# Patient Record
Sex: Female | Born: 1945
Health system: Southern US, Community
[De-identification: ages and names within clinical notes are randomized; demographics above are authoritative.]

## PROBLEM LIST (undated history)

## (undated) DIAGNOSIS — M1990 Unspecified osteoarthritis, unspecified site: Secondary | ICD-10-CM

## (undated) DIAGNOSIS — E785 Hyperlipidemia, unspecified: Secondary | ICD-10-CM

## (undated) DIAGNOSIS — I10 Essential (primary) hypertension: Secondary | ICD-10-CM

## (undated) DIAGNOSIS — K219 Gastro-esophageal reflux disease without esophagitis: Secondary | ICD-10-CM

## (undated) DIAGNOSIS — R7303 Prediabetes: Secondary | ICD-10-CM

## (undated) DIAGNOSIS — G2581 Restless legs syndrome: Secondary | ICD-10-CM

## (undated) HISTORY — DX: Unspecified osteoarthritis, unspecified site: M19.90

## (undated) HISTORY — PX: CHOLECYSTECTOMY: SHX55

## (undated) HISTORY — DX: Hyperlipidemia, unspecified: E78.5

## (undated) HISTORY — PX: ANKLE SURGERY: SHX546

## (undated) HISTORY — DX: Restless legs syndrome: G25.81

## (undated) HISTORY — PX: TUBAL LIGATION: SHX77

## (undated) HISTORY — DX: Essential (primary) hypertension: I10

## (undated) HISTORY — DX: Prediabetes: R73.03

## (undated) HISTORY — DX: Gastro-esophageal reflux disease without esophagitis: K21.9

---

## 1999-07-22 ENCOUNTER — Ambulatory Visit (HOSPITAL_BASED_OUTPATIENT_CLINIC_OR_DEPARTMENT_OTHER): Admission: RE | Admit: 1999-07-22 | Discharge: 1999-07-22 | Payer: Self-pay | Admitting: Orthopedic Surgery

## 2000-07-06 ENCOUNTER — Other Ambulatory Visit: Admission: RE | Admit: 2000-07-06 | Discharge: 2000-07-06 | Payer: Self-pay | Admitting: *Deleted

## 2001-08-06 ENCOUNTER — Other Ambulatory Visit: Admission: RE | Admit: 2001-08-06 | Discharge: 2001-08-06 | Payer: Self-pay | Admitting: *Deleted

## 2001-11-06 ENCOUNTER — Other Ambulatory Visit: Admission: RE | Admit: 2001-11-06 | Discharge: 2001-11-06 | Payer: Self-pay | Admitting: *Deleted

## 2002-08-21 ENCOUNTER — Ambulatory Visit (HOSPITAL_COMMUNITY): Admission: RE | Admit: 2002-08-21 | Discharge: 2002-08-21 | Payer: Self-pay | Admitting: Internal Medicine

## 2002-08-21 ENCOUNTER — Encounter: Payer: Self-pay | Admitting: Internal Medicine

## 2002-08-22 ENCOUNTER — Ambulatory Visit (HOSPITAL_COMMUNITY): Admission: RE | Admit: 2002-08-22 | Discharge: 2002-08-22 | Payer: Self-pay | Admitting: Internal Medicine

## 2002-08-22 ENCOUNTER — Encounter: Payer: Self-pay | Admitting: Internal Medicine

## 2002-10-07 ENCOUNTER — Other Ambulatory Visit: Admission: RE | Admit: 2002-10-07 | Discharge: 2002-10-07 | Payer: Self-pay | Admitting: Internal Medicine

## 2003-11-18 ENCOUNTER — Encounter (INDEPENDENT_AMBULATORY_CARE_PROVIDER_SITE_OTHER): Payer: Self-pay | Admitting: *Deleted

## 2004-03-21 ENCOUNTER — Ambulatory Visit (HOSPITAL_COMMUNITY): Admission: RE | Admit: 2004-03-21 | Discharge: 2004-03-21 | Payer: Self-pay | Admitting: Internal Medicine

## 2004-04-08 ENCOUNTER — Emergency Department (HOSPITAL_COMMUNITY): Admission: EM | Admit: 2004-04-08 | Discharge: 2004-04-09 | Payer: Self-pay | Admitting: Emergency Medicine

## 2004-08-24 ENCOUNTER — Other Ambulatory Visit: Admission: RE | Admit: 2004-08-24 | Discharge: 2004-08-24 | Payer: Self-pay | Admitting: Family Medicine

## 2005-08-22 ENCOUNTER — Ambulatory Visit (HOSPITAL_COMMUNITY): Admission: RE | Admit: 2005-08-22 | Discharge: 2005-08-22 | Payer: Self-pay | Admitting: Internal Medicine

## 2006-06-07 ENCOUNTER — Other Ambulatory Visit: Admission: RE | Admit: 2006-06-07 | Discharge: 2006-06-07 | Payer: Self-pay | Admitting: Internal Medicine

## 2008-04-27 ENCOUNTER — Inpatient Hospital Stay (HOSPITAL_COMMUNITY): Admission: EM | Admit: 2008-04-27 | Discharge: 2008-04-29 | Payer: Self-pay | Admitting: Emergency Medicine

## 2009-08-03 ENCOUNTER — Encounter: Admission: RE | Admit: 2009-08-03 | Discharge: 2009-08-03 | Payer: Self-pay | Admitting: Orthopaedic Surgery

## 2009-08-04 ENCOUNTER — Encounter: Admission: RE | Admit: 2009-08-04 | Discharge: 2009-08-04 | Payer: Self-pay | Admitting: Orthopaedic Surgery

## 2009-11-23 ENCOUNTER — Ambulatory Visit: Payer: Self-pay | Admitting: Gynecology

## 2009-11-23 ENCOUNTER — Other Ambulatory Visit: Admission: RE | Admit: 2009-11-23 | Discharge: 2009-11-23 | Payer: Self-pay | Admitting: Gynecology

## 2010-07-21 ENCOUNTER — Ambulatory Visit (HOSPITAL_COMMUNITY): Admission: RE | Admit: 2010-07-21 | Discharge: 2010-07-21 | Payer: Self-pay | Admitting: Internal Medicine

## 2010-08-10 LAB — HM COLONOSCOPY

## 2011-01-10 NOTE — Procedures (Signed)
Summary: Colonoscopy/[facility]  Colonoscopy/[facility]   Imported By: Sherian Rein 08/25/2010 07:51:30  _____________________________________________________________________  External Attachment:    Type:   Image     Comment:   External Document

## 2011-04-25 NOTE — Op Note (Signed)
NAME:  Emily Jenkins, Emily Jenkins           ACCOUNT NO.:  [id number]   MEDICAL RECORD NO.:  [id number]          PATIENT TYPE:  INP   LOCATION:  5023                         FACILITY:  [facility]   PHYSICIAN:  Mark C. Ophelia Charter, M.D.    DATE OF BIRTH:  1946-05-31   DATE OF PROCEDURE:  04/27/2008  DATE OF DISCHARGE:                               OPERATIVE REPORT   PREOPERATIVE DIAGNOSIS:  Left bimalleolar ankle fracture.   POSTOPERATIVE DIAGNOSIS:  Left bimalleolar ankle fracture.   PROCEDURE:  Open reduction and internal fixation of left bimalleolar  ankle fracture.   SURGEON:  Mark C. Ophelia Charter, MD.   ANESTHESIA:  General plus Marcaine local 14 mL.   DRAINS:  None.   TOURNIQUET TIME:  42 minutes x350.   BRIEF HISTORY:  A 65 year old female [place], and fell suffering a left bimalleolar ankle fracture  with comminution of the medial malleolus as well as the fibula with a  short oblique fracture.  She had significant subluxation of her ankle  and was placed in a splint in the emergency room and brought out for an  operative stabilization.   After standard prepping and draping, preoperative Ancef, time-out  procedure, using the surgical checklist, incision was made medially  subperiosteal dissection, the cortex was very thin and was actually  bendable.  After reduction and irrigation, posterior tibial tendon was  present.  There was some venous bleeding and significant subcu hematoma  formation from the fracture, which was irrigated and branches of the  saphenous were coagulated.  Incision was then made laterally over the  fibula subperiosteal section reduction.  Anterior cortex had some  comminution but the large distal piece was able be lined up using the  posterior cortex and the lateral cortex.  Anteriorly, there were several  small little pieces at the level of the fracture and slightly distal.  With 2 major fragments, anatomic 701 tubular plate was  placed distal.  Unicortical screws. proximal bicortical screws x4, and an inner frag  screw from the anterior to posterior lagging the fracture.  A 24-mm  cortical screw was used for this.  Medial side was then readdressed.  Fracture reduced and two 50-mm cancellous lag screws were placed across,  tightened down and checked under fluoroscopy.  There was stability of  the fracture, although, there was  comminution and that she will be nonweightbearing in a cast.  Wound was  irrigated medially and then both wounds were closed with 2-0 Vicryl  followed by skin staple closure and Marcaine infiltration.  Adaptic, 4 x  4s, Webril, and short-leg splint.      Mark C. Ophelia Charter, M.D.  Electronically Signed     MCY/MEDQ  D:  04/27/2008  T:  04/28/2008  Job:  [id number]

## 2011-09-06 LAB — POCT I-STAT, CHEM 8
Calcium, Ion: 1.05 — ABNORMAL LOW
Chloride: 105
Creatinine, Ser: 1
Hemoglobin: 12.2
Potassium: 3.7
Sodium: 140

## 2011-09-06 LAB — PROTIME-INR: INR: 1

## 2011-09-06 LAB — CBC
Hemoglobin: 12.3
MCHC: 34.6
RBC: 4.25
RDW: 13.1

## 2011-09-06 LAB — APTT: aPTT: 30

## 2012-08-05 ENCOUNTER — Other Ambulatory Visit (HOSPITAL_COMMUNITY): Payer: Self-pay | Admitting: Internal Medicine

## 2012-08-05 ENCOUNTER — Ambulatory Visit (HOSPITAL_COMMUNITY)
Admission: RE | Admit: 2012-08-05 | Discharge: 2012-08-05 | Disposition: A | Discharge status: Home / Self Care | Payer: Medicare Other | Source: Ambulatory Visit | Attending: Internal Medicine | Admitting: Internal Medicine

## 2012-08-05 DIAGNOSIS — R109 Unspecified abdominal pain: Secondary | ICD-10-CM | POA: Insufficient documentation

## 2012-08-05 DIAGNOSIS — K5900 Constipation, unspecified: Secondary | ICD-10-CM | POA: Insufficient documentation

## 2012-08-05 DIAGNOSIS — R0602 Shortness of breath: Secondary | ICD-10-CM | POA: Insufficient documentation

## 2012-08-05 DIAGNOSIS — R509 Fever, unspecified: Secondary | ICD-10-CM | POA: Insufficient documentation

## 2012-08-19 ENCOUNTER — Other Ambulatory Visit: Payer: Self-pay | Admitting: Internal Medicine

## 2012-08-19 DIAGNOSIS — J984 Other disorders of lung: Secondary | ICD-10-CM

## 2012-08-26 ENCOUNTER — Ambulatory Visit
Admission: RE | Admit: 2012-08-26 | Discharge: 2012-08-26 | Disposition: A | Discharge status: Home / Self Care | Payer: Medicare Other | Source: Ambulatory Visit | Attending: Internal Medicine | Admitting: Internal Medicine

## 2012-08-26 DIAGNOSIS — J984 Other disorders of lung: Secondary | ICD-10-CM

## 2012-08-28 ENCOUNTER — Other Ambulatory Visit: Payer: Self-pay

## 2012-08-28 DIAGNOSIS — M79609 Pain in unspecified limb: Secondary | ICD-10-CM

## 2012-08-28 DIAGNOSIS — I83893 Varicose veins of bilateral lower extremities with other complications: Secondary | ICD-10-CM

## 2012-09-13 ENCOUNTER — Encounter: Payer: Self-pay | Admitting: Vascular Surgery

## 2012-09-16 ENCOUNTER — Ambulatory Visit (INDEPENDENT_AMBULATORY_CARE_PROVIDER_SITE_OTHER): Payer: Medicare Other | Admitting: Vascular Surgery

## 2012-09-16 ENCOUNTER — Encounter (INDEPENDENT_AMBULATORY_CARE_PROVIDER_SITE_OTHER): Payer: Medicare Other | Admitting: *Deleted

## 2012-09-16 ENCOUNTER — Encounter: Payer: Self-pay | Admitting: Vascular Surgery

## 2012-09-16 VITALS — BP 143/80 | HR 73 | Resp 16 | Ht 65.5 in | Wt 165.0 lb

## 2012-09-16 DIAGNOSIS — I83893 Varicose veins of bilateral lower extremities with other complications: Secondary | ICD-10-CM

## 2012-09-16 DIAGNOSIS — I8393 Asymptomatic varicose veins of bilateral lower extremities: Secondary | ICD-10-CM | POA: Insufficient documentation

## 2012-09-16 DIAGNOSIS — M79609 Pain in unspecified limb: Secondary | ICD-10-CM

## 2012-09-16 NOTE — Progress Notes (Signed)
Subjective:     Patient ID: Emily Jenkins, female   DOB: [birth year], 66 y.o.   MRN: [id number]  HPI this 66 year old female is referred by Dr. Marlowe Shores for evaluation of bilateral venous insufficiency. This patient states she has had enlarging varicose veins for 15-20 years. She has increasing aching throbbing and burning discomfort in both calves and ankles which worsens as the day progresses. This is partially relieved by elevation. She is not worn elastic compression stockings on a regular basis. She has no history of DVT, thrombophlebitis, stasis ulcers, bleeding, or other complications. Varicosities have continued to enlarge and her symptoms have continued to worsen. She develops swelling in the ankles as the day progresses. Right leg is slightly more symptomatic than the left.  History reviewed. No pertinent past medical history.  History  Substance Use Topics  . Smoking status: Never Smoker   . Smokeless tobacco: Not on file  . Alcohol Use: No    Family History  Problem Relation Age of Onset  . Heart disease Mother   . Hypertension Mother   . Cancer Father   . Diabetes Father   . Hyperlipidemia Sister   . Hypertension Sister     No Known Allergies  Current outpatient prescriptions:cetirizine (ZYRTEC) 10 MG tablet, Take 10 mg by mouth as needed., Disp: , Rfl: ;  simvastatin (ZOCOR) 20 MG tablet, Take 20 mg by mouth every evening., Disp: , Rfl: ;  valsartan-hydrochlorothiazide (DIOVAN-HCT) 320-25 MG per tablet, Take 1 tablet by mouth daily., Disp: , Rfl:   BP 143/80  Pulse 73  Resp 16  Ht 5' 5.5" (1.664 m)  Wt 165 lb (74.844 kg)  BMI 27.04 kg/m2  Body mass index is 27.04 kg/(m^2).           Review of Systems denies chest pain but does have mild dyspnea on exertion. Has history of left ankle fracture in his pain in this area. Also complains of occasional numbness and tingling in her fingers bilaterally. Occasional chest discomfort. All other systems negative  complete review of systems    Objective:   Physical Exam blood pressure 143/80 heart rate 73 respirations 16 Gen.-alert and oriented x3 in no apparent distress HEENT normal for age Lungs no rhonchi or wheezing Cardiovascular regular rhythm no murmurs carotid pulses 3+ palpable no bruits audible Abdomen soft nontender no palpable masses Musculoskeletal free of  major deformities Skin clear -no rashes Neurologic normal Lower extremities 3+ femoral and dorsalis pedis pulses palpable bilaterally with 1+ edema bilaterally. Left leg has bulging varicosities in the medial calf with 1+ edema and early hyperpigmentation. These are over the great saphenous system. Right leg has large or bulging varicosities in the medial calf and posterior calf up to the popliteal fossa. She has 1+ edema on the right with early hyperpigmentation.  Today I ordered bilateral venous duplex exam which I reviewed and interpreted. The right leg has no DVT and no deep venous reflux. There is gross reflux in the right great saphenous and small saphenous systems supplying these varicosities. Left leg has no DVT and has gross reflux in the great saphenous system from the knee to the saphenofemoral junction with no reflux in the small saphenous system.       Assessment:     Bilateral severe venous insufficiency with gross reflux bilateral great saphenous veins and right small saphenous vein supplying bulging symptomatic varicosities-affecting patient's daily living on increasing basis    Plan:     #1 long-leg elastic compression  stockings 20-30 mm gradient #2 elevate legs daily as much as possible #3 ibuprofen on a daily basis #4 return in 3 months. She has had no significant improvement she should #1 laser ablation right great saphenous vein followed by #2 laser ablation right small saphenous vein followed by #3 laser ablation left great saphenous vein. Patient should then return in 3 months to see if stab phlebectomy of  secondary varicosities bilaterally  is needed

## 2012-10-11 ENCOUNTER — Encounter: Payer: Medicare Other | Admitting: Vascular Surgery

## 2012-12-16 ENCOUNTER — Encounter: Payer: Self-pay | Admitting: Vascular Surgery

## 2012-12-17 ENCOUNTER — Ambulatory Visit: Payer: Medicare Other | Admitting: Vascular Surgery

## 2012-12-27 ENCOUNTER — Encounter: Payer: Self-pay | Admitting: Vascular Surgery

## 2012-12-30 ENCOUNTER — Encounter: Payer: Self-pay | Admitting: Vascular Surgery

## 2012-12-30 ENCOUNTER — Ambulatory Visit (INDEPENDENT_AMBULATORY_CARE_PROVIDER_SITE_OTHER): Payer: Medicare PPO | Admitting: Vascular Surgery

## 2012-12-30 VITALS — BP 146/73 | HR 92 | Resp 16 | Ht 65.0 in | Wt 165.0 lb

## 2012-12-30 DIAGNOSIS — I83893 Varicose veins of bilateral lower extremities with other complications: Secondary | ICD-10-CM

## 2012-12-30 NOTE — Progress Notes (Signed)
Subjective:     Patient ID: Emily Jenkins, female   DOB: 06-05-1946, 67 y.o.   MRN: [id number]  HPI this 67 year old female returns for continued evaluation of her severe varicose veins in both lower extremities . She has documented reflux in both great saphenous systems and the right small saphenous system which has resulted in bulging varicosities throughout both legs. These cause aching throbbing and burning discomfort. She has tried long-leg elastic compression stockings 20-30 mm gradient as well as elevation and ibuprofen over the past 3 months with no success. She continues to have significant discomfort throughout the day particularly while she is standing or sitting. She has no history of DVT or thrombophlebitis.  No past medical history on file.  History  Substance Use Topics  . Smoking status: Never Smoker   . Smokeless tobacco: Not on file  . Alcohol Use: No    Family History  Problem Relation Age of Onset  . Heart disease Mother   . Hypertension Mother   . Cancer Father   . Diabetes Father   . Hyperlipidemia Sister   . Hypertension Sister     No Known Allergies  Current outpatient prescriptions:cetirizine (ZYRTEC) 10 MG tablet, Take 10 mg by mouth as needed., Disp: , Rfl: ;  omeprazole (PRILOSEC) 40 MG capsule, Take 40 mg by mouth daily., Disp: , Rfl: ;  simvastatin (ZOCOR) 20 MG tablet, Take 20 mg by mouth every evening., Disp: , Rfl: ;  valsartan-hydrochlorothiazide (DIOVAN-HCT) 320-25 MG per tablet, Take 1 tablet by mouth daily., Disp: , Rfl:   BP 146/73  Pulse 92  Resp 16  Ht 5\' 5"  (1.651 m)  Wt 165 lb (74.844 kg)  BMI 27.46 kg/m2  Body mass index is 27.46 kg/(m^2).        Review of Systems denies chest pain, dyspnea on exertion, PND, orthopnea,, suspect    Objective:   Physical Exam blood pressure 146/73 heart rate 92 respirations 16 General well-developed well-nourished female in no apparent distress alert and oriented x3 Lungs no rhonchi or  wheezing Lower extremities-right leg has bulging varicosities in the medial thigh and medial calf posterior calf with 1+ chronic edema. No active ulceration noted. 3+ dorsalis pedis pulse palpable. Left leg has bulging varicosities in the medial calf and extending down onto the dorsum of the foot with 1+ edema in mild hyperpigmentation but no active ulcer.    Assessment:     Severe bilateral venous insufficiency-symptomatic secondary to gross reflux in bilateral great saphenous veins and right small saphenous vein-resistant to conservative measures-affecting patient's daily living    Plan:     Patient needs #1 laser ablation right great saphenous vein followed by #2 laser ablation right small saphenous vein followed by #3 laser ablation left great saphenous vein. Patient should then return in 3 months to see if stab phlebectomy of residual varicosities would be indicated We'll proceed with precertification to perform this in the near future to relieve symptoms of this nice lady

## 2012-12-31 ENCOUNTER — Ambulatory Visit: Payer: Medicare Other | Admitting: Vascular Surgery

## 2013-01-03 ENCOUNTER — Other Ambulatory Visit: Payer: Self-pay | Admitting: *Deleted

## 2013-01-03 DIAGNOSIS — I83893 Varicose veins of bilateral lower extremities with other complications: Secondary | ICD-10-CM

## 2013-01-06 ENCOUNTER — Ambulatory Visit (INDEPENDENT_AMBULATORY_CARE_PROVIDER_SITE_OTHER): Payer: Medicare PPO | Admitting: Vascular Surgery

## 2013-01-06 ENCOUNTER — Encounter: Payer: Self-pay | Admitting: Vascular Surgery

## 2013-01-06 VITALS — BP 146/80 | HR 80 | Resp 16 | Ht 65.5 in | Wt 165.0 lb

## 2013-01-06 DIAGNOSIS — I83893 Varicose veins of bilateral lower extremities with other complications: Secondary | ICD-10-CM

## 2013-01-06 NOTE — Progress Notes (Signed)
Subjective:     Patient ID: Emily Jenkins, female   DOB: 06-Aug-1946, 67 y.o.   MRN: [id number]  HPI this 67 year old female had laser ablation of the right great saphenous vein from the knee to the saphenofemoral junction performed under local tumescent anesthesia for painful varicosities. She tolerated surgery well. Total 2100 J of energy was utilized  Review of Systems     Objective:   Physical ExamBP 146/80  Pulse 80  Resp 16  Ht 5' 5.5" (1.664 m)  Wt 165 lb (74.844 kg)  BMI 27.04 kg/m2      Assessment:     Well-tolerated laser ablation right great saphenous vein performed under local tumescent anesthesia painful varicosities    Plan:     Return February 4 for venous duplex exam right leg to confirm closure right great saphenous vein Patient will then need laser ablation right small saphenous and left great saphenous vein in  near future

## 2013-01-06 NOTE — Progress Notes (Signed)
Laser Ablation Procedure      Date: 01/06/2013    Emily Jenkins DOB:Nov 13, 1946  Consent signed: Yes  Surgeon:J.D. Hart [place]  Procedure: Laser Ablation: right Greater Saphenous Vein  BP 146/80  Pulse 80  Resp 16  Ht 5' 5.5" (1.664 m)  Wt 165 lb (74.844 kg)  BMI 27.04 kg/m2  Start time: 11:20   End time: 11:50  Tumescent Anesthesia: 300 cc 0.9% NaCl with 50 cc Lidocaine HCL with 1% Epi and 15 cc 8.4% NaHCO3  Local Anesthesia: 5 cc Lidocaine HCL and NaHCO3 (ratio 2:1)  Pulsed mode: Watts 15 Seconds 1 Pulses:1 Total Pulses:140 Total Energy: 2100 Total Time: 2:20     Patient tolerated procedure well: Yes  Notes:   Description of Procedure:  After marking the course of the saphenous vein and the secondary varicosities in the standing position, the patient was placed on the operating table in the supine position, and the right leg was prepped and draped in sterile fashion. Local anesthetic was administered, and under ultrasound guidance the saphenous vein was accessed with a micro needle and guide wire; then the micro puncture sheath was placed. A guide wire was inserted to the saphenofemoral junction, followed by a 5 french sheath.  The position of the sheath and then the laser fiber below the junction was confirmed using the ultrasound and visualization of the aiming beam.  Tumescent anesthesia was administered along the course of the saphenous vein using ultrasound guidance. Protective laser glasses were placed on the patient, and the laser was fired at 15 watt pulsed mode advancing 1-2 mm per sec.  For a total of 2100 joules.  A steri strip was applied to the puncture site.   ABD pads and thigh high compression stockings were applied.  Ace wrap bandages were applied over the phlebectomy sites and at the top of the saphenofemoral junction.  Blood loss was less than 15 cc.  The patient ambulated out of the operating room having tolerated the procedure well.

## 2013-01-07 ENCOUNTER — Telehealth: Payer: Self-pay | Admitting: *Deleted

## 2013-01-07 NOTE — Telephone Encounter (Signed)
Patient doing well. Not having any problems except maybe the Ibuprofen is bothering her stomach. Discussed taking the med on a full stomach, etc. Reminded her of her fu appt. And also reviewed the instructions.

## 2013-01-13 ENCOUNTER — Encounter: Payer: Self-pay | Admitting: Vascular Surgery

## 2013-01-14 ENCOUNTER — Encounter: Payer: Self-pay | Admitting: Vascular Surgery

## 2013-01-14 ENCOUNTER — Encounter (INDEPENDENT_AMBULATORY_CARE_PROVIDER_SITE_OTHER): Payer: Medicare PPO | Admitting: *Deleted

## 2013-01-14 ENCOUNTER — Ambulatory Visit (INDEPENDENT_AMBULATORY_CARE_PROVIDER_SITE_OTHER): Payer: Medicare PPO | Admitting: Vascular Surgery

## 2013-01-14 ENCOUNTER — Other Ambulatory Visit: Payer: Self-pay | Admitting: *Deleted

## 2013-01-14 VITALS — BP 141/76 | HR 86 | Resp 16 | Ht 65.5 in | Wt 165.0 lb

## 2013-01-14 DIAGNOSIS — I83893 Varicose veins of bilateral lower extremities with other complications: Secondary | ICD-10-CM

## 2013-01-14 NOTE — Progress Notes (Signed)
Subjective:     Patient ID: Emily Jenkins, female   DOB: 1946-11-23, 67 y.o.   MRN: [id number]  HPI this 67 year old female returns 1 week post laser ablation right great saphenous vein performed for venous hypertension with bulging varicosities and chronic edema. She has had mild to moderate discomfort along the course of the great saphenous vein which is beginning to improve significantly. She is noted that the ulcers in the distal thigh and medial calf are less tight than they were previously. She does have some chronic edema in the right ankle. She continues to have symptoms in the contralateral leg as well. She has worn her elastic compression stocking and taking ibuprofen as instructed.  No past medical history on file.  History  Substance Use Topics  . Smoking status: Never Smoker   . Smokeless tobacco: Not on file  . Alcohol Use: No    Family History  Problem Relation Age of Onset  . Heart disease Mother   . Hypertension Mother   . Cancer Father   . Diabetes Father   . Hyperlipidemia Sister   . Hypertension Sister     No Known Allergies  Current outpatient prescriptions:cetirizine (ZYRTEC) 10 MG tablet, Take 10 mg by mouth as needed., Disp: , Rfl: ;  omeprazole (PRILOSEC) 40 MG capsule, Take 40 mg by mouth daily., Disp: , Rfl: ;  simvastatin (ZOCOR) 20 MG tablet, Take 20 mg by mouth every evening., Disp: , Rfl: ;  valsartan-hydrochlorothiazide (DIOVAN-HCT) 320-25 MG per tablet, Take 1 tablet by mouth daily., Disp: , Rfl:   BP 141/76  Pulse 86  Resp 16  Ht 5' 5.5" (1.664 m)  Wt 165 lb (74.844 kg)  BMI 27.04 kg/m2  Body mass index is 27.04 kg/(m^2).           Review of Systems denies chest pain, dyspnea on exertion, PND, orthopnea, hemoptysis, claudication    Objective:   Physical Exam blood pressure 141/76 heart rate 86 respirations 16 General well-developed well-nourished female in no apparent stress alert and oriented x3 Lungs no rhonchi or  wheezing Right lower extremity with mild tenderness along the course of great saphenous vein with no ecchymosis noted. 1+ chronic edema with early hyperpigmentation. Bulging varicosities in the medial calf extending down to the mid calf level.  Today I ordered a venous duplex exam of the right leg which are reviewed and interpreted. There is no DVT. Next great saphenous vein is totally occluded from the distal thigh of to a distance 1.5 cm from the saphenofemoral junction    Assessment:     Successful well-tolerated laser ablation right great saphenous vein performed under local tumescent anesthesia    Plan:     Plan laser ablation right small saphenous vein 02/10/2013 and will then proceed with laser ablation left great saphenous vein as  the third procedure.

## 2013-02-07 ENCOUNTER — Encounter: Payer: Self-pay | Admitting: Vascular Surgery

## 2013-02-10 ENCOUNTER — Ambulatory Visit (INDEPENDENT_AMBULATORY_CARE_PROVIDER_SITE_OTHER): Payer: Medicare PPO | Admitting: Vascular Surgery

## 2013-02-10 ENCOUNTER — Encounter: Payer: Self-pay | Admitting: Vascular Surgery

## 2013-02-10 VITALS — BP 162/89 | HR 83 | Resp 16 | Ht 65.5 in | Wt 165.0 lb

## 2013-02-10 DIAGNOSIS — I83893 Varicose veins of bilateral lower extremities with other complications: Secondary | ICD-10-CM

## 2013-02-10 NOTE — Progress Notes (Signed)
Subjective:     Patient ID: Emily Jenkins, female   DOB: 1946/09/24, 67 y.o.   MRN: [id number]  HPI this 67 year old female had laser ablation right small saphenous vein performed under local tumescent anesthesia for venous hypertension in the right leg. She tolerated the procedure well. Total of 925 J of energy was utilized. She did not have a direct connection from the saphenous vein to the popliteal vein.   Review of Systems     Objective:   Physical Exam BP 162/89  Pulse 83  Resp 16  Ht 5' 5.5" (1.664 m)  Wt 165 lb (74.844 kg)  BMI 27.03 kg/m2        Assessment:     Well-tolerated laser ablation right small saphenous vein performed under local tumescent anesthesia    Plan:     Return March 11 venous duplex exam right leg to confirm closure right small saphenous vein Will then be scheduled for laser ablation left great saphenous vein

## 2013-02-10 NOTE — Progress Notes (Signed)
Laser Ablation Procedure      Date: 02/10/2013    Emily Jenkins DOB:01/24/1946  Consent signed: Yes  Surgeon:J.D. Hart [place]  Procedure: Laser Ablation: right Small Saphenous Vein  BP 162/89  Pulse 83  Resp 16  Ht 5' 5.5" (1.664 m)  Wt 165 lb (74.844 kg)  BMI 27.03 kg/m2  Start time: 1:15   End time: 1:490  Tumescent Anesthesia: 100 cc 0.9% NaCl with 50 cc Lidocaine HCL with 1% Epi and 15 cc 8.4% NaHCO3  Local Anesthesia: 3 cc Lidocaine HCL and NaHCO3 (ratio 2:1)  Pulsed mode:yes Watts 15 Seconds 1 Pulses:1 Total Pulses:63 Total Energy: 842 Total Time: 1:02     Patient tolerated procedure well: Yes  Notes:   Description of Procedure:  After marking the course of the saphenous vein and the secondary varicosities in the standing position, the patient was placed on the operating table in the prone position, and the right leg was prepped and draped in sterile fashion. Local anesthetic was administered, and under ultrasound guidance the saphenous vein was accessed with a micro needle and guide wire; then the micro puncture sheath was placed. A guide wire was inserted to the saphenopopliteal junction, followed by a 5 french sheath.  The position of the sheath and then the laser fiber below the junction was confirmed using the ultrasound and visualization of the aiming beam.  Tumescent anesthesia was administered along the course of the saphenous vein using ultrasound guidance. Protective laser glasses were placed on the patient, and the laser was fired at 15 watt pulsed mode advancing 1-2 mm per sec.  For a total of 842 joules.  A steri strip was applied to the puncture site.    ABD pads and thigh high compression stockings were applied.  Ace wrap bandages were applied over the phlebectomy sites and at the top of the saphenopopliteal junction.  Blood loss was less than 15 cc.  The patient ambulated out of the operating room having tolerated the procedure well.

## 2013-02-11 ENCOUNTER — Telehealth: Payer: Self-pay | Admitting: *Deleted

## 2013-02-11 ENCOUNTER — Encounter: Payer: Self-pay | Admitting: Vascular Surgery

## 2013-02-11 NOTE — Telephone Encounter (Signed)
Patient doing well except unable to sleep. She usually takes something to help her and last night she didn't. Told her to check with her pharmacist to see if she could mix her med with Ibuprofen. Otherwise doing well. Reminded her of her fu appt.

## 2013-02-17 ENCOUNTER — Encounter: Payer: Self-pay | Admitting: Vascular Surgery

## 2013-02-18 ENCOUNTER — Ambulatory Visit (INDEPENDENT_AMBULATORY_CARE_PROVIDER_SITE_OTHER): Payer: Medicare PPO | Admitting: Vascular Surgery

## 2013-02-18 ENCOUNTER — Other Ambulatory Visit: Payer: Self-pay | Admitting: *Deleted

## 2013-02-18 ENCOUNTER — Encounter: Payer: Self-pay | Admitting: Vascular Surgery

## 2013-02-18 ENCOUNTER — Encounter (INDEPENDENT_AMBULATORY_CARE_PROVIDER_SITE_OTHER): Payer: Medicare PPO | Admitting: *Deleted

## 2013-02-18 ENCOUNTER — Telehealth: Payer: Self-pay | Admitting: *Deleted

## 2013-02-18 VITALS — BP 144/78 | HR 80 | Resp 16 | Ht 65.5 in | Wt 165.0 lb

## 2013-02-18 DIAGNOSIS — I83893 Varicose veins of bilateral lower extremities with other complications: Secondary | ICD-10-CM

## 2013-02-18 NOTE — Progress Notes (Signed)
Subjective:     Patient ID: Emily Jenkins, female   DOB: 1946-04-18, 67 y.o.   MRN: [id number]  HPIthis 67 year old female returns for followup regarding her laser ablation of the right small saphenous vein which was performed last week for venous hypertension. She had previously had the right great saphenous vein treated with laser ablation. She has had some moderate discomfort in the right calf following the procedure but no distal edema in the symptoms are improving. She has been wearing her elastic compression stocking. He tends to have symptoms in the left leg and is wearing a compression stocking as well.  History reviewed. No pertinent past medical history.  History  Substance Use Topics  . Smoking status: Never Smoker   . Smokeless tobacco: Not on file  . Alcohol Use: No    Family History  Problem Relation Age of Onset  . Heart disease Mother   . Hypertension Mother   . Cancer Father   . Diabetes Father   . Hyperlipidemia Sister   . Hypertension Sister     No Known Allergies  Current outpatient prescriptions:butalbital-acetaminophen-caffeine (FIORICET WITH CODEINE) 50-325-40-30 MG per capsule, Take 1 capsule by mouth every 4 (four) hours as needed for headache., Disp: , Rfl: ;  cetirizine (ZYRTEC) 10 MG tablet, Take 10 mg by mouth as needed., Disp: , Rfl: ;  omeprazole (PRILOSEC) 40 MG capsule, Take 40 mg by mouth daily., Disp: , Rfl:  oxazepam (SERAX) 30 MG capsule, Take 30 mg by mouth at bedtime as needed for sleep or anxiety., Disp: , Rfl: ;  simvastatin (ZOCOR) 20 MG tablet, Take 20 mg by mouth every evening., Disp: , Rfl: ;  valsartan-hydrochlorothiazide (DIOVAN-HCT) 320-25 MG per tablet, Take 1 tablet by mouth daily., Disp: , Rfl:   BP 144/78  Pulse 80  Resp 16  Ht 5' 5.5" (1.664 m)  Wt 165 lb (74.844 kg)  BMI 27.03 kg/m2  Body mass index is 27.03 kg/(m^2).             Review of Systemsdenies chest pain, dyspnea on exertion, PND, orthopnea,  hemoptysis, claudication     Objective:   Physical Examblood pressure 144/78 heart rate 80 respirations 16 General well-developed well-nourished female in no apparent stress alert and oriented x3 Lungs no rhonchi or wheezing Right leg with mild tenderness along course of small saphenous vein posteriorly. No distal edema noted. Bulging varicosities in the medial proximal calf are not as tense as before but still present.  Today I ordered a venous duplex exam of the right leg which are reviewed and interpreted. There is no DVT. There is total closure of the right great and right small saphenous veins.     Assessment:     Successful laser ablation right great and small saphenous veins are venous hypertension     Plan:     Return March 24 for similar procedure on contralateral left great saphenous vein

## 2013-02-18 NOTE — Telephone Encounter (Signed)
Told pt about her fu visits

## 2013-02-28 ENCOUNTER — Encounter: Payer: Self-pay | Admitting: Vascular Surgery

## 2013-03-03 ENCOUNTER — Ambulatory Visit (INDEPENDENT_AMBULATORY_CARE_PROVIDER_SITE_OTHER): Payer: Medicare PPO | Admitting: Vascular Surgery

## 2013-03-03 ENCOUNTER — Encounter: Payer: Self-pay | Admitting: Vascular Surgery

## 2013-03-03 VITALS — BP 156/73 | HR 82 | Resp 16 | Ht 65.5 in | Wt 165.0 lb

## 2013-03-03 DIAGNOSIS — I83893 Varicose veins of bilateral lower extremities with other complications: Secondary | ICD-10-CM

## 2013-03-03 NOTE — Progress Notes (Signed)
Laser Ablation Procedure      Date: 03/03/2013    Emily Jenkins DOB:[birth year]  Consent signed: Yes  Surgeon:J.D. Hart [place]  Procedure: Laser Ablation: left Greater Saphenous Vein  BP 156/73  Pulse 82  Resp 16  Ht 5' 5.5" (1.664 m)  Wt 165 lb (74.844 kg)  BMI 27.03 kg/m2  Start time: 1:00   End time: 1:35  Tumescent Anesthesia: 300 cc 0.9% NaCl with 50 cc Lidocaine HCL with 1% Epi and 15 cc 8.4% NaHCO3  Local Anesthesia: 5 cc Lidocaine HCL and NaHCO3 (ratio 2:1)  Pulsed mode:yes Watts 15 Seconds 1 Pulses:1 Total Pulses:111 Total Energy: 1657 Total Time: 1:50     Patient tolerated procedure well: Yes  Notes:   Description of Procedure:  After marking the course of the saphenous vein and the secondary varicosities in the standing position, the patient was placed on the operating table in the supine position, and the left leg was prepped and draped in sterile fashion. Local anesthetic was administered, and under ultrasound guidance the saphenous vein was accessed with a micro needle and guide wire; then the micro puncture sheath was placed. A guide wire was inserted to the saphenofemoral junction, followed by a 5 french sheath.  The position of the sheath and then the laser fiber below the junction was confirmed using the ultrasound and visualization of the aiming beam.  Tumescent anesthesia was administered along the course of the saphenous vein using ultrasound guidance. Protective laser glasses were placed on the patient, and the laser was fired at 15 watt pulsed mode advancing 1-2 mm per sec.  For a total of 1657 joules.  A steri strip was applied to the puncture site.    ABD pads and thigh high compression stockings were applied.  Ace wrap bandages were applied over the phlebectomy sites and at the top of the saphenofemoral junction.  Blood loss was less than 15 cc.  The patient ambulated out of the operating room having tolerated the procedure well.

## 2013-03-04 ENCOUNTER — Telehealth: Payer: Self-pay | Admitting: *Deleted

## 2013-03-04 NOTE — Telephone Encounter (Signed)
Patient doing well. No pain. Following all instructions.

## 2013-03-10 ENCOUNTER — Encounter: Payer: Self-pay | Admitting: Vascular Surgery

## 2013-03-11 ENCOUNTER — Encounter: Payer: Self-pay | Admitting: Vascular Surgery

## 2013-03-11 ENCOUNTER — Ambulatory Visit (INDEPENDENT_AMBULATORY_CARE_PROVIDER_SITE_OTHER): Payer: Medicare PPO | Admitting: Vascular Surgery

## 2013-03-11 ENCOUNTER — Encounter (INDEPENDENT_AMBULATORY_CARE_PROVIDER_SITE_OTHER): Payer: Medicare PPO | Admitting: *Deleted

## 2013-03-11 VITALS — BP 129/85 | HR 87 | Resp 16 | Ht 65.5 in | Wt 165.0 lb

## 2013-03-11 DIAGNOSIS — I83893 Varicose veins of bilateral lower extremities with other complications: Secondary | ICD-10-CM

## 2013-03-11 DIAGNOSIS — Z48812 Encounter for surgical aftercare following surgery on the circulatory system: Secondary | ICD-10-CM

## 2013-03-11 NOTE — Progress Notes (Signed)
Subjective:     Patient ID: Emily Jenkins, female   DOB: 06/20/1946, 67 y.o.   MRN: [id number]  HPI this 67 year old female returns 1 week post laser ablation left great saphenous vein for painful varicosities do to reflux in the left great saphenous system. She has had moderate discomfort in the left medial thigh which is now improving. She did take ibuprofen and use ice and heat to improve this. She has noticed no significant edema distally. She is wearing her long leg elastic compression stocking as instructed.  History reviewed. No pertinent past medical history.  History  Substance Use Topics  . Smoking status: Never Smoker   . Smokeless tobacco: Not on file  . Alcohol Use: No    Family History  Problem Relation Age of Onset  . Heart disease Mother   . Hypertension Mother   . Cancer Father   . Diabetes Father   . Hyperlipidemia Sister   . Hypertension Sister     No Known Allergies  Current outpatient prescriptions:butalbital-acetaminophen-caffeine (FIORICET WITH CODEINE) 50-325-40-30 MG per capsule, Take 1 capsule by mouth every 4 (four) hours as needed for headache., Disp: , Rfl: ;  cetirizine (ZYRTEC) 10 MG tablet, Take 10 mg by mouth as needed., Disp: , Rfl: ;  omeprazole (PRILOSEC) 40 MG capsule, Take 40 mg by mouth daily., Disp: , Rfl:  oxazepam (SERAX) 30 MG capsule, Take 30 mg by mouth at bedtime as needed for sleep or anxiety., Disp: , Rfl: ;  simvastatin (ZOCOR) 20 MG tablet, Take 20 mg by mouth every evening., Disp: , Rfl: ;  valsartan-hydrochlorothiazide (DIOVAN-HCT) 320-25 MG per tablet, Take 1 tablet by mouth daily., Disp: , Rfl:   BP 129/85  Pulse 87  Resp 16  Ht 5' 5.5" (1.664 m)  Wt 165 lb (74.844 kg)  BMI 27.03 kg/m2  Body mass index is 27.03 kg/(m^2).          Review of Systems denies chest pain, dyspnea on exertion, hemoptysis, claudication.     Objective:   Physical Exam blood pressure 129/85 heart rate 87 respirations 16 General  well-developed well-nourished female in no apparent distress alert and oriented x3 Lungs no rhonchi or wheezing Cardiovascular regular rhythm no murmurs Left leg with mild discomfort along the course of great saphenous vein in mid thigh. Bulging varicosities remain in the left distal thigh medial calf area. 1+ distal edema.  Today I ordered a venous duplex exam which I reviewed and interpreted. The left great saphenous vein is closed up to about 3 cm from the saphenofemoral junction with no DVT    Assessment:     Successful laser ablation left great saphenous, right great saphenous, and right small saphenous veins    Plan:     Return in 3 months to see if stab phlebectomy will be necessary for residual varicosities

## 2013-06-10 ENCOUNTER — Ambulatory Visit: Payer: Medicare PPO | Admitting: Vascular Surgery

## 2013-07-01 ENCOUNTER — Ambulatory Visit: Payer: Medicare PPO | Admitting: Vascular Surgery

## 2013-08-06 ENCOUNTER — Other Ambulatory Visit (HOSPITAL_COMMUNITY)
Admission: RE | Admit: 2013-08-06 | Discharge: 2013-08-06 | Disposition: A | Discharge status: Home / Self Care | Payer: Medicare PPO | Source: Ambulatory Visit | Attending: Internal Medicine | Admitting: Internal Medicine

## 2013-08-06 ENCOUNTER — Other Ambulatory Visit: Payer: Self-pay | Admitting: Emergency Medicine

## 2013-08-06 DIAGNOSIS — Z124 Encounter for screening for malignant neoplasm of cervix: Secondary | ICD-10-CM | POA: Insufficient documentation

## 2013-08-12 ENCOUNTER — Other Ambulatory Visit: Payer: Self-pay | Admitting: Internal Medicine

## 2013-08-12 DIAGNOSIS — R911 Solitary pulmonary nodule: Secondary | ICD-10-CM

## 2013-08-15 ENCOUNTER — Ambulatory Visit
Admission: RE | Admit: 2013-08-15 | Discharge: 2013-08-15 | Disposition: A | Discharge status: Home / Self Care | Payer: Medicare PPO | Source: Ambulatory Visit | Attending: Internal Medicine | Admitting: Internal Medicine

## 2013-08-15 ENCOUNTER — Other Ambulatory Visit: Payer: Medicare PPO

## 2013-08-15 DIAGNOSIS — R911 Solitary pulmonary nodule: Secondary | ICD-10-CM

## 2013-10-24 ENCOUNTER — Ambulatory Visit: Payer: Commercial Managed Care - HMO | Admitting: *Deleted

## 2013-10-24 DIAGNOSIS — Z23 Encounter for immunization: Secondary | ICD-10-CM

## 2013-12-01 ENCOUNTER — Encounter: Payer: Self-pay | Admitting: Internal Medicine

## 2013-12-02 ENCOUNTER — Ambulatory Visit: Payer: Commercial Managed Care - HMO | Admitting: Internal Medicine

## 2013-12-02 DIAGNOSIS — R7309 Other abnormal glucose: Secondary | ICD-10-CM | POA: Insufficient documentation

## 2013-12-02 DIAGNOSIS — E782 Mixed hyperlipidemia: Secondary | ICD-10-CM | POA: Insufficient documentation

## 2013-12-02 DIAGNOSIS — I10 Essential (primary) hypertension: Secondary | ICD-10-CM | POA: Insufficient documentation

## 2013-12-02 DIAGNOSIS — E559 Vitamin D deficiency, unspecified: Secondary | ICD-10-CM | POA: Insufficient documentation

## 2013-12-02 NOTE — Progress Notes (Deleted)
Patient ID: Emily Jenkins, female   DOB: 12-Oct-1946, 67 y.o.   MRN: [id number]   This very nice 67 y.o.female presents for 3 month follow up with Hypertension, Hyperlipidemia, Pre-Diabetes and Vitamin D Deficiency.    BP has been controlled at home. Today's BP is   . Patient denies any cardiac type chest pain, palpitations, dyspnea/orthopnea/PND, dizziness, claudication, or dependent edema.   Hyperlipidemia is controlled with diet & meds. Last Cholesterol was  , Triglycerides were    , HDL   , and LDL    . Patient denies myalgias or other med SE's.    Also, the patient has history of PreDiabetes/insulin resistance with last A1c of    . Patient denies any symptoms of reactive hypoglycemia, diabetic polys, paresthesias or visual blurring.   Further, Patient has history of Vitamin D Deficiency with last vitamin D of   . Patient supplements vitamin D without any suspected side-effects.  Current Outpatient Prescriptions on File Prior to Visit  Medication Sig Dispense Refill  . aspirin 81 MG tablet Take 81 mg by mouth daily.      . butalbital-acetaminophen-caffeine (FIORICET WITH CODEINE) 50-325-40-30 MG per capsule Take 1 capsule by mouth every 4 (four) hours as needed for headache.      . cetirizine (ZYRTEC) 10 MG tablet Take 10 mg by mouth as needed.      . Cholecalciferol (VITAMIN D-3) 1000 UNITS CAPS Take 2,000 Units by mouth daily.      Marland Kitchen omeprazole (PRILOSEC) 40 MG capsule Take 40 mg by mouth daily.      Marland Kitchen oxazepam (SERAX) 30 MG capsule Take 30 mg by mouth at bedtime as needed for sleep or anxiety.      . simvastatin (ZOCOR) 20 MG tablet Take 20 mg by mouth every evening.      . valsartan-hydrochlorothiazide (DIOVAN-HCT) 320-25 MG per tablet Take 1 tablet by mouth daily.       No current facility-administered medications on file prior to visit.     Allergies  Allergen Reactions  . Fosamax [Alendronate Sodium]   . Sulfa Antibiotics     PMHx:   Past Medical History  Diagnosis  Date  . Hypertension   . Hyperlipidemia   . Pre-diabetes   . GERD (gastroesophageal reflux disease)   . RLS (restless legs syndrome)   . DJD (degenerative joint disease)     FHx:    Reviewed / unchanged  SHx:    Reviewed / unchanged  Systems Review: Constitutional: Denies fever, chills, wt changes, headaches, insomnia, fatigue, night sweats, change in appetite. Eyes: Denies redness, blurred vision, diplopia, discharge, itchy, watery eyes.  ENT: Denies discharge, congestion, post nasal drip, epistaxis, sore throat, earache, hearing loss, dental pain, tinnitus, vertigo, sinus pain, snoring.  CV: Denies chest pain, palpitations, irregular heartbeat, syncope, dyspnea, diaphoresis, orthopnea, PND, claudication, edema. Respiratory: denies cough, dyspnea, DOE, pleurisy, hoarseness, laryngitis, wheezing.  Gastrointestinal: Denies dysphagia, odynophagia, heartburn, reflux, water brash, abdominal pain or cramps, nausea, vomiting, bloating, diarrhea, constipation, hematemesis, melena, hematochezia,  or hemorrhoids. Genitourinary: Denies dysuria, frequency, urgency, nocturia, hesitancy, discharge, hematuria, flank pain. Musculoskeletal: Denies arthralgias, myalgias, stiffness, jt. swelling, pain, limp, strain/sprain.  Skin: Denies pruritus, rash, hives, warts, acne, eczema, change in skin lesion(s). Neuro: No weakness, tremor, incoordination, spasms, paresthesia, or pain. Psychiatric: Denies confusion, memory loss, or sensory loss. Endo: Denies change in weight, skin, hair change.  Heme/Lymph: No excessive bleeding, bruising, orenlarged lymph nodes.  There were no vitals filed for this visit.  Estimated  body mass index is 27.03 kg/(m^2) as calculated from the following:   Height as of 03/11/13: 5' 5.5" (1.664 m).   Weight as of 03/11/13: 165 lb (74.844 kg).  On Exam: Appears well nourished - in no distress. Eyes: PERRLA, EOMs, conjunctiva no swelling or erythema. Sinuses: No frontal/maxillary  tenderness ENT/Mouth: EAC's clear, TM's nl w/o erythema, bulging. Nares clear w/o erythema, swelling, exudates. Oropharynx clear without erythema or exudates. Oral hygiene is good. Tongue normal, non obstructing. Hearing intact.  Neck: Supple. Thyroid nl. Car 2+/2+ without bruits, nodes or JVD. Chest: Respirations nl with BS clear & equal w/o rales, rhonchi, wheezing or stridor.  Cor: Heart sounds normal w/ regular rate and rhythm without sig. murmurs, gallops, clicks, or rubs. Peripheral pulses normal and equal  without edema.  Abdomen: Soft & bowel sounds normal. Non-tender w/o guarding, rebound, hernias, masses, or organomegaly.  Lymphatics: Unremarkable.  Musculoskeletal: Full ROM all peripheral extremities, joint stability, 5/5 strength, and normal gait.  Skin: Warm, dry without exposed rashes, lesions, ecchymosis apparent.  Neuro: Cranial nerves intact, reflexes equal bilaterally. Sensory-motor testing grossly intact. Tendon reflexes grossly intact.  Pysch: Alert & oriented x 3. Insight and judgement nl & appropriate. No ideations.  Assessment and Plan:  1. Hypertension - Continue monitor blood pressure at home. Continue diet/meds same.  2. Hyperlipidemia - Continue diet/meds, exercise,& lifestyle modifications. Continue monitor periodic cholesterol/liver & renal functions   3. Pre-diabetes/Insulin Resistance - Continue diet, exercise, lifestyle modifications. Monitor appropriate labs.  4. Vitamin D Deficiency - Continue supplementation.  Recommended regular exercise, BP monitoring, weight control, and discussed med and SE's. Recommended labs to assess and monitor clinical status. Further disposition pending results of labs.

## 2013-12-02 NOTE — Patient Instructions (Signed)

## 2014-01-05 ENCOUNTER — Ambulatory Visit: Payer: Commercial Managed Care - HMO | Admitting: Internal Medicine

## 2014-01-05 DIAGNOSIS — Z79899 Other long term (current) drug therapy: Secondary | ICD-10-CM | POA: Insufficient documentation

## 2014-01-05 NOTE — Patient Instructions (Signed)

## 2014-01-05 NOTE — Progress Notes (Addendum)
Patient ID: Emily Jenkins, female   DOB: 07/04/1946, 68 y.o.   MRN: [id number]  Emily Jenkins

## 2014-01-08 ENCOUNTER — Encounter: Payer: Self-pay | Admitting: Internal Medicine

## 2014-01-08 ENCOUNTER — Ambulatory Visit (INDEPENDENT_AMBULATORY_CARE_PROVIDER_SITE_OTHER): Payer: Medicare PPO | Admitting: Internal Medicine

## 2014-01-08 ENCOUNTER — Ambulatory Visit: Payer: Self-pay | Admitting: Internal Medicine

## 2014-01-08 VITALS — BP 120/68 | HR 80 | Temp 97.9°F | Resp 16 | Wt 163.6 lb

## 2014-01-08 DIAGNOSIS — I10 Essential (primary) hypertension: Secondary | ICD-10-CM

## 2014-01-08 DIAGNOSIS — E782 Mixed hyperlipidemia: Secondary | ICD-10-CM

## 2014-01-08 DIAGNOSIS — R7309 Other abnormal glucose: Secondary | ICD-10-CM

## 2014-01-08 DIAGNOSIS — E559 Vitamin D deficiency, unspecified: Secondary | ICD-10-CM

## 2014-01-08 DIAGNOSIS — Z79899 Other long term (current) drug therapy: Secondary | ICD-10-CM

## 2014-01-08 LAB — LIPID PANEL
Cholesterol: 177 mg/dL (ref 0–200)
HDL: 65 mg/dL (ref >39)
LDL CALC: 96 mg/dL (ref 0–99)
TRIGLYCERIDES: 79 mg/dL (ref <150)
Total CHOL/HDL Ratio: 2.7 Ratio
VLDL: 16 mg/dL (ref 0–40)

## 2014-01-08 LAB — CBC WITH DIFFERENTIAL/PLATELET
BASOS ABS: 0 10*3/uL (ref 0.0–0.1)
Basophils Relative: 1 % (ref 0–1)
EOS PCT: 2 % (ref 0–5)
Eosinophils Absolute: 0.1 10*3/uL (ref 0.0–0.7)
HCT: 38.4 % (ref 36.0–46.0)
Hemoglobin: 13 g/dL (ref 12.0–15.0)
Lymphocytes Relative: 26 % (ref 12–46)
Lymphs Abs: 1 10*3/uL (ref 0.7–4.0)
MCH: 27.1 pg (ref 26.0–34.0)
MCHC: 33.9 g/dL (ref 30.0–36.0)
MCV: 80 fL (ref 78.0–100.0)
Monocytes Absolute: 0.3 10*3/uL (ref 0.1–1.0)
Monocytes Relative: 7 % (ref 3–12)
NEUTROS PCT: 64 % (ref 43–77)
Neutro Abs: 2.5 10*3/uL (ref 1.7–7.7)
Platelets: 279 10*3/uL (ref 150–400)
RBC: 4.8 MIL/uL (ref 3.87–5.11)
RDW: 15 % (ref 11.5–15.5)
WBC: 3.8 10*3/uL — ABNORMAL LOW (ref 4.0–10.5)

## 2014-01-08 LAB — TSH: TSH: 1.849 u[IU]/mL (ref 0.350–4.500)

## 2014-01-08 LAB — HEPATIC FUNCTION PANEL
ALT: 11 U/L (ref 0–35)
AST: 19 U/L (ref 0–37)
Albumin: 4.5 g/dL (ref 3.5–5.2)
Alkaline Phosphatase: 80 U/L (ref 39–117)
BILIRUBIN TOTAL: 0.5 mg/dL (ref 0.2–1.2)
Bilirubin, Direct: 0.1 mg/dL (ref 0.0–0.3)
Indirect Bilirubin: 0.4 mg/dL (ref 0.2–1.2)
Total Protein: 7.6 g/dL (ref 6.0–8.3)

## 2014-01-08 LAB — MAGNESIUM: Magnesium: 2 mg/dL (ref 1.5–2.5)

## 2014-01-08 LAB — BASIC METABOLIC PANEL WITH GFR
BUN: 10 mg/dL (ref 6–23)
CHLORIDE: 100 meq/L (ref 96–112)
CO2: 30 mEq/L (ref 19–32)
CREATININE: 0.69 mg/dL (ref 0.50–1.10)
Calcium: 9.8 mg/dL (ref 8.4–10.5)
GFR, Est African American: >89 mL/min
Glucose, Bld: 92 mg/dL (ref 70–99)
Potassium: 3.7 mEq/L (ref 3.5–5.3)
SODIUM: 140 meq/L (ref 135–145)

## 2014-01-08 LAB — HEMOGLOBIN A1C
HEMOGLOBIN A1C: 5.9 % — AB (ref <5.7)
MEAN PLASMA GLUCOSE: 123 mg/dL — AB (ref <117)

## 2014-01-08 NOTE — Progress Notes (Signed)
Patient ID: Emily Jenkins, female   DOB: 05/02/1946, 68 y.o.   MRN: [id number]   This very nice 68 y.o. MBF presents for 3 month follow up with Hypertension, Hyperlipidemia, Pre-Diabetes and Vitamin D Deficiency.    HTN predates since the 1990's. BP has been controlled at home. Today's BP: 120/68 mmHg. Patient denies any cardiac type chest pain, palpitations, dyspnea/orthopnea/PND, dizziness, claudication, or dependent edema.   Hyperlipidemia is controlled with diet & meds. Last Cholesterol was 160, Triglycerides were 68, HDL 66 and LDL 80 in Aug 2014. Patient denies myalgias or other med SE's.    Also, the patient has history of PreDiabetes with A1c 5.9% in Feb 2013 and  with last A1c of 6.1% in Aug 2104. Patient denies any symptoms of reactive hypoglycemia, diabetic polys, paresthesias or visual blurring.   Further, Patient has history of Vitamin D Deficiency of 109 in Aug 2014 with last vitamin D of 67 in Aug 2014. Patient supplements vitamin D without any suspected side-effects. Also, she has GERD which is controlled with her diet and meds. Lastly, she does report occas mild depression w/o delusional or self-destructive thoughts    Medication List         Aloe Vera 25 MG Caps  Take 25 mg by mouth daily.     aspirin 81 MG tablet  Take 81 mg by mouth daily.     butalbital-acetaminophen-caffeine 50-325-40-30 MG per capsule  Commonly known as:  FIORICET WITH CODEINE  Take 1 capsule by mouth every 4 (four) hours as needed for headache.     cetirizine 10 MG tablet  Commonly known as:  ZYRTEC  Take 10 mg by mouth as needed.     omeprazole 40 MG capsule  Commonly known as:  PRILOSEC  Take 40 mg by mouth daily.     oxazepam 30 MG capsule  Commonly known as:  SERAX  Take 30 mg by mouth at bedtime as needed for sleep or anxiety.     simvastatin 20 MG tablet  Commonly known as:  ZOCOR  Take 20 mg by mouth every evening.     valsartan-hydrochlorothiazide 320-25 MG per tablet   Commonly known as:  DIOVAN-HCT  Take 1 tablet by mouth daily.     Vitamin D-3 1000 UNITS Caps  Take 2,000 Units by mouth daily.         Allergies  Allergen Reactions  . Fosamax [Alendronate Sodium]   . Sulfa Antibiotics     PMHx:   Past Medical History  Diagnosis Date  . Hypertension   . Hyperlipidemia   . Pre-diabetes   . GERD (gastroesophageal reflux disease)   . RLS (restless legs syndrome)   . DJD (degenerative joint disease)     FHx:    Reviewed / unchanged  SHx:    Reviewed / unchanged  Systems Review: Constitutional: Denies fever, chills, wt changes, headaches, insomnia, fatigue, night sweats, change in appetite. Eyes: Denies redness, blurred vision, diplopia, discharge, itchy, watery eyes.  ENT: Denies discharge, congestion, post nasal drip, epistaxis, sore throat, earache, hearing loss, dental pain, tinnitus, vertigo, sinus pain, snoring.  CV: Denies chest pain, palpitations, irregular heartbeat, syncope, dyspnea, diaphoresis, orthopnea, PND, claudication, edema. Respiratory: denies cough, dyspnea, DOE, pleurisy, hoarseness, laryngitis, wheezing.  Gastrointestinal: Denies dysphagia, odynophagia, heartburn, reflux, water brash, abdominal pain or cramps, nausea, vomiting, bloating, diarrhea, constipation, hematemesis, melena, hematochezia,  or hemorrhoids. Genitourinary: Denies dysuria, frequency, urgency, nocturia, hesitancy, discharge, hematuria, flank pain. Musculoskeletal: Denies arthralgias, myalgias, stiffness, jt. swelling, pain,  limp, strain/sprain.  Skin: Denies pruritus, rash, hives, warts, acne, eczema, change in skin lesion(s). Neuro: No weakness, tremor, incoordination, spasms, paresthesia, or pain. Psychiatric: Denies confusion, memory loss, or sensory loss. Endo: Denies change in weight, skin, hair change.  Heme/Lymph: No excessive bleeding, bruising, orenlarged lymph nodes.  BP: 120/68  Pulse: 80  Temp: 97.9 F (36.6 C)  Resp: 16     Estimated body mass index is 26.8 kg/(m^2) as calculated from the following:   Height as of 03/11/13: 5' 5.5" (1.664 m).   Weight as of this encounter: 163 lb 9.6 oz (74.208 kg).  On Exam: Appears well nourished - in no distress. Eyes: PERRLA, EOMs, conjunctiva no swelling or erythema. Sinuses: No frontal/maxillary tenderness ENT/Mouth: EAC's clear, TM's nl w/o erythema, bulging. Nares clear w/o erythema, swelling, exudates. Oropharynx clear without erythema or exudates. Oral hygiene is good. Tongue normal, non obstructing. Hearing intact.  Neck: Supple. Thyroid nl. Car 2+/2+ without bruits, nodes or JVD. Chest: Respirations nl with BS clear & equal w/o rales, rhonchi, wheezing or stridor.  Cor: Heart sounds normal w/ regular rate and rhythm without sig. murmurs, gallops, clicks, or rubs. Peripheral pulses normal and equal  without edema.  Abdomen: Soft & bowel sounds normal. Non-tender w/o guarding, rebound, hernias, masses, or organomegaly. Musculoskeletal: Full ROM all peripheral extremities, joint stability, 5/5 strength, and normal gait.  Skin: Warm, dry without exposed rashes, lesions, ecchymosis apparent.  Neuro: Cranial nerves intact, reflexes equal bilaterally. Sensory-motor testing grossly intact. Tendon reflexes grossly intact.  Pysch: Alert & oriented x 3. Insight and judgement nl & appropriate. No ideations.  Assessment and Plan:  1. Hypertension - Continue monitor blood pressure at home. Continue diet/meds same.  2. Hyperlipidemia - Continue diet/meds, exercise,& lifestyle modifications. Continue monitor periodic cholesterol/liver & renal functions   3. Pre-diabetes/Insulin Resistance - Continue diet, exercise, lifestyle modifications. Monitor appropriate labs.  4. Vitamin D Deficiency - Continue supplementation.  5. GERD  6. Osteopenia  7. DJD  Recommended regular exercise, BP monitoring, weight control, and discussed med and SE's. Recommended labs to assess and  monitor clinical status. Further disposition pending results of labs.

## 2014-01-08 NOTE — Patient Instructions (Signed)

## 2014-01-09 LAB — INSULIN, FASTING: INSULIN FASTING, SERUM: 9 u[IU]/mL (ref 3–28)

## 2014-01-09 LAB — VITAMIN D 25 HYDROXY (VIT D DEFICIENCY, FRACTURES): Vit D, 25-Hydroxy: 74 ng/mL (ref 30–89)

## 2014-01-20 ENCOUNTER — Encounter: Payer: Self-pay | Admitting: Internal Medicine

## 2014-01-20 ENCOUNTER — Ambulatory Visit (INDEPENDENT_AMBULATORY_CARE_PROVIDER_SITE_OTHER): Payer: Medicare PPO | Admitting: Internal Medicine

## 2014-01-20 VITALS — BP 140/78 | HR 92 | Temp 98.2°F | Resp 18 | Wt 165.4 lb

## 2014-01-20 DIAGNOSIS — I10 Essential (primary) hypertension: Secondary | ICD-10-CM

## 2014-01-20 DIAGNOSIS — L600 Ingrowing nail: Secondary | ICD-10-CM

## 2014-01-20 NOTE — Progress Notes (Signed)
   Subjective:    Patient ID: Emily Jenkins, female    DOB: 04-08-1946, 68 y.o.   MRN: [id number]  HPI This is a very nice MBF seen in 1 mo f/u of her HTN, Pre Diabetes, Hyperlipidemia, GERD, Vit D Deficiency, Osteopenia and DJD had  recent  OV showing A1c improved from 6.1% in Aug to 5.7% 1 week ago. All other labs reported OK. She also C/o discomfort and itching in both ears. Lastly she complains of bilat ingrown 1st toenails.  Meds, Allergies, PMH, FHx, SHx, reviewed fromrecent OV approx. 1 week ago with no new problems identified.  Review of Systems Negative to above      Objective:   Physical Exam  HEENT- neg Neck - supple , Chest - clear , Cor - RRRw/o sig MGR & No edema, Abd - benign - MS & Neuro WNL.  Exam focused on Bilat 1st toenails reveals both to be ingrown - medial edge on the left 1st toenail and lateral edge on the Rt 1st toenail  Assessment & Plan:   1. Unspecified essential hypertension   2. Ingrown left big toenail   3. Ingrown right big toenail  -------------------------------------------------------------------------------------------------------------------------------  Patient presents for evaluation of  Bilat ingrown 1st toenails which appear minimally chalky and thickened with leading edges inverting growing into lateral and medial margin nail gutters.  The risks, benefits, indications, potential complications, and alternatives were explained to the patient.  Procedure Details  After informed consent and local anesthesia with approx 1.5-2.0 ml xylocaine 1% to each toe and  the areas were prepped by sterile/aseptic technique with isopropyl alcohol.  Procedure: following adequate local anesthesia the medial Lt 1st Toenail margin - approx 25-33% was excised from the leading edge rostad back to the growth plate under the cuticle and the nail and bed detritus was sharply excised to clear the nail bed. After Abx ung was generously applied and reinforced with  2 x 2 " gauze and wrapped with 1" tape for hemostasis pressure. Then the Lateral edge of the Rt 1st toenail was likewise prepped, anesthetized and nail similiarly excised and nail bed sharply debrided of detritus. Dressing likewise applied.  Condition: Stable  Complications:  None  Diagnosis/Procedure code:   1. Avulsion Nail Plate Lt / (51761) 2. Avulsion Nail Plate Rt / (60737)  Plan: 1. Patient was instructed to keep the wound dry and covered for 24-48 hours and clean thereafter. 2. Warning signs of infection were reviewed & pt advised to call if any questions/problems.    3. Recommended that the patient use her husband Vicodin as needed for pain.

## 2014-01-20 NOTE — Patient Instructions (Signed)

## 2014-02-02 ENCOUNTER — Other Ambulatory Visit: Payer: Self-pay | Admitting: Emergency Medicine

## 2014-02-02 DIAGNOSIS — R918 Other nonspecific abnormal finding of lung field: Secondary | ICD-10-CM

## 2014-04-01 ENCOUNTER — Other Ambulatory Visit: Payer: Self-pay

## 2014-04-09 ENCOUNTER — Other Ambulatory Visit: Payer: Self-pay | Admitting: Internal Medicine

## 2014-04-14 ENCOUNTER — Ambulatory Visit (INDEPENDENT_AMBULATORY_CARE_PROVIDER_SITE_OTHER): Payer: Commercial Managed Care - HMO | Admitting: Physician Assistant

## 2014-04-14 ENCOUNTER — Encounter: Payer: Self-pay | Admitting: Physician Assistant

## 2014-04-14 VITALS — BP 120/68 | HR 72 | Temp 97.9°F | Resp 16 | Wt 166.0 lb

## 2014-04-14 DIAGNOSIS — Z0000 Encounter for general adult medical examination without abnormal findings: Secondary | ICD-10-CM

## 2014-04-14 DIAGNOSIS — E782 Mixed hyperlipidemia: Secondary | ICD-10-CM

## 2014-04-14 DIAGNOSIS — R079 Chest pain, unspecified: Secondary | ICD-10-CM

## 2014-04-14 DIAGNOSIS — E559 Vitamin D deficiency, unspecified: Secondary | ICD-10-CM

## 2014-04-14 DIAGNOSIS — Z1331 Encounter for screening for depression: Secondary | ICD-10-CM

## 2014-04-14 DIAGNOSIS — Z789 Other specified health status: Secondary | ICD-10-CM

## 2014-04-14 DIAGNOSIS — R7309 Other abnormal glucose: Secondary | ICD-10-CM

## 2014-04-14 DIAGNOSIS — Z79899 Other long term (current) drug therapy: Secondary | ICD-10-CM

## 2014-04-14 DIAGNOSIS — I10 Essential (primary) hypertension: Secondary | ICD-10-CM

## 2014-04-14 LAB — CBC WITH DIFFERENTIAL/PLATELET
BASOS PCT: 1 % (ref 0–1)
Basophils Absolute: 0 10*3/uL (ref 0.0–0.1)
EOS ABS: 0.1 10*3/uL (ref 0.0–0.7)
Eosinophils Relative: 3 % (ref 0–5)
HCT: 35.6 % — ABNORMAL LOW (ref 36.0–46.0)
Hemoglobin: 12 g/dL (ref 12.0–15.0)
Lymphocytes Relative: 27 % (ref 12–46)
Lymphs Abs: 0.9 10*3/uL (ref 0.7–4.0)
MCH: 26.1 pg (ref 26.0–34.0)
MCHC: 33.7 g/dL (ref 30.0–36.0)
MCV: 77.6 fL — ABNORMAL LOW (ref 78.0–100.0)
Monocytes Absolute: 0.3 10*3/uL (ref 0.1–1.0)
Monocytes Relative: 8 % (ref 3–12)
NEUTROS PCT: 61 % (ref 43–77)
Neutro Abs: 2.1 10*3/uL (ref 1.7–7.7)
Platelets: 260 10*3/uL (ref 150–400)
RBC: 4.59 MIL/uL (ref 3.87–5.11)
RDW: 15 % (ref 11.5–15.5)
WBC: 3.4 10*3/uL — ABNORMAL LOW (ref 4.0–10.5)

## 2014-04-14 LAB — HEMOGLOBIN A1C
HEMOGLOBIN A1C: 6.1 % — AB (ref <5.7)
Mean Plasma Glucose: 128 mg/dL — ABNORMAL HIGH (ref <117)

## 2014-04-14 NOTE — Patient Instructions (Signed)
Get on omeprazole every day, take 30 mins before food If you have any continuing chest pain or shortness of breath please go to the hospital.   Preventative Care for Adults - Female      MAINTAIN REGULAR HEALTH EXAMS:  A routine yearly physical is a good way to check in with your primary care provider about your health and preventive screening. It is also an opportunity to share updates about your health and any concerns you have, and receive a thorough all-over exam.   Most health insurance companies pay for at least some preventative services.  Check with your health plan for specific coverages.  WHAT PREVENTATIVE SERVICES DO WOMEN NEED?  Adult women should have their weight and blood pressure checked regularly.   Women age 69 and older should have their cholesterol levels checked regularly.  Women should be screened for cervical cancer with a Pap smear and pelvic exam beginning at either age 69, or 3 years after they become sexually activity.    Breast cancer screening generally begins at age 40 with a mammogram and breast exam by your primary care provider.    Beginning at age 15 and continuing to age 25, women should be screened for colorectal cancer.  Certain people may need continued testing until age 38.  Updating vaccinations is part of preventative care.  Vaccinations help protect against diseases such as the flu.  Osteoporosis is a disease in which the bones lose minerals and strength as we age. Women ages 17 and over should discuss this with their caregivers, as should women after menopause who have other risk factors.  Lab tests are generally done as part of preventative care to screen for anemia and blood disorders, to screen for problems with the kidneys and liver, to screen for bladder problems, to check blood sugar, and to check your cholesterol level.  Preventative services generally include counseling about diet, exercise, avoiding tobacco, drugs, excessive alcohol  consumption, and sexually transmitted infections.    GENERAL RECOMMENDATIONS FOR GOOD HEALTH:  Healthy diet:  Eat a variety of foods, including fruit, vegetables, animal or vegetable protein, such as meat, fish, chicken, and eggs, or beans, lentils, tofu, and grains, such as rice.  Drink plenty of water daily.  Decrease saturated fat in the diet, avoid lots of red meat, processed foods, sweets, fast foods, and fried foods.  Exercise:  Aerobic exercise helps maintain good heart health. At least 30-40 minutes of moderate-intensity exercise is recommended. For example, a brisk walk that increases your heart rate and breathing. This should be done on most days of the week.   Find a type of exercise or a variety of exercises that you enjoy so that it becomes a part of your daily life.  Examples are running, walking, swimming, water aerobics, and biking.  For motivation and support, explore group exercise such as aerobic class, spin class, Zumba, Yoga,or  martial arts, etc.    Set exercise goals for yourself, such as a certain weight goal, walk or run in a race such as a 5k walk/run.  Speak to your primary care provider about exercise goals.  Disease prevention:  If you smoke or chew tobacco, find out from your caregiver how to quit. It can literally save your life, no matter how long you have been a tobacco user. If you do not use tobacco, never begin.   Maintain a healthy diet and normal weight. Increased weight leads to problems with blood pressure and diabetes.   The  Body Mass Index or BMI is a way of measuring how much of your body is fat. Having a BMI above 27 increases the risk of heart disease, diabetes, hypertension, stroke and other problems related to obesity. Your caregiver can help determine your BMI and based on it develop an exercise and dietary program to help you achieve or maintain this important measurement at a healthful level.  High blood pressure causes heart and blood  vessel problems.  Persistent high blood pressure should be treated with medicine if weight loss and exercise do not work.   Fat and cholesterol leaves deposits in your arteries that can block them. This causes heart disease and vessel disease elsewhere in your body.  If your cholesterol is found to be high, or if you have heart disease or certain other medical conditions, then you may need to have your cholesterol monitored frequently and be treated with medication.   Ask if you should have a cardiac stress test if your history suggests this. A stress test is a test done on a treadmill that looks for heart disease. This test can find disease prior to there being a problem.  Menopause can be associated with physical symptoms and risks. Hormone replacement therapy is available to decrease these. You should talk to your caregiver about whether starting or continuing to take hormones is right for you.   Osteoporosis is a disease in which the bones lose minerals and strength as we age. This can result in serious bone fractures. Risk of osteoporosis can be identified using a bone density scan. Women ages 26 and over should discuss this with their caregivers, as should women after menopause who have other risk factors. Ask your caregiver whether you should be taking a calcium supplement and Vitamin D, to reduce the rate of osteoporosis.   Avoid drinking alcohol in excess (more than two drinks per day).  Avoid use of street drugs. Do not share needles with anyone. Ask for professional help if you need assistance or instructions on stopping the use of alcohol, cigarettes, and/or drugs.  Brush your teeth twice a day with fluoride toothpaste, and floss once a day. Good oral hygiene prevents tooth decay and gum disease. The problems can be painful, unattractive, and can cause other health problems. Visit your dentist for a routine oral and dental check up and preventive care every 6-12 months.   Look at your skin  regularly.  Use a mirror to look at your back. Notify your caregivers of changes in moles, especially if there are changes in shapes, colors, a size larger than a pencil eraser, an irregular border, or development of new moles.  Safety:  Use seatbelts 100% of the time, whether driving or as a passenger.  Use safety devices such as hearing protection if you work in environments with loud noise or significant background noise.  Use safety glasses when doing any work that could send debris in to the eyes.  Use a helmet if you ride a bike or motorcycle.  Use appropriate safety gear for contact sports.  Talk to your caregiver about gun safety.  Use sunscreen with a SPF (or skin protection factor) of 15 or greater.  Lighter skinned people are at a greater risk of skin cancer. Don't forget to also wear sunglasses in order to protect your eyes from too much damaging sunlight. Damaging sunlight can accelerate cataract formation.   Practice safe sex. Use condoms. Condoms are used for birth control and to help reduce the spread  of sexually transmitted infections (or STIs).  Some of the STIs are gonorrhea (the clap), chlamydia, syphilis, trichomonas, herpes, HPV (human papilloma virus) and HIV (human immunodeficiency virus) which causes AIDS. The herpes, HIV and HPV are viral illnesses that have no cure. These can result in disability, cancer and death.   Keep carbon monoxide and smoke detectors in your home functioning at all times. Change the batteries every 6 months or use a model that plugs into the wall.   Vaccinations:  Stay up to date with your tetanus shots and other required immunizations. You should have a booster for tetanus every 10 years. Be sure to get your flu shot every year, since 5%-20% of the [place] population comes down with the flu. The flu vaccine changes each year, so being vaccinated once is not enough. Get your shot in the fall, before the flu season peaks.   Other vaccines to  consider:  Human Papilloma Virus or HPV causes cancer of the cervix, and other infections that can be transmitted from person to person. There is a vaccine for HPV, and females should get immunized between the ages of 45 and 35. It requires a series of 3 shots.   Pneumococcal vaccine to protect against certain types of pneumonia.  This is normally recommended for adults age 38 or older.  However, adults younger than 69 years old with certain underlying conditions such as diabetes, heart or lung disease should also receive the vaccine.  Shingles vaccine to protect against Varicella Zoster if you are older than age 42, or younger than 69 years old with certain underlying illness.  Hepatitis A vaccine to protect against a form of infection of the liver by a virus acquired from food.  Hepatitis B vaccine to protect against a form of infection of the liver by a virus acquired from blood or body fluids, particularly if you work in health care.  If you plan to travel internationally, check with your local health department for specific vaccination recommendations.  Cancer Screening:  Breast cancer screening is essential to preventive care for women. All women age 17 and older should perform a breast self-exam every month. At age 31 and older, women should have their caregiver complete a breast exam each year. Women at ages 37 and older should have a mammogram (x-ray film) of the breasts. Your caregiver can discuss how often you need mammograms.    Cervical cancer screening includes taking a Pap smear (sample of cells examined under a microscope) from the cervix (end of the uterus). It also includes testing for HPV (Human Papilloma Virus, which can cause cervical cancer). Screening and a pelvic exam should begin at age 29, or 3 years after a woman becomes sexually active. Screening should occur every year, with a Pap smear but no HPV testing, up to age 35. After age 59, you should have a Pap smear every 3  years with HPV testing, if no HPV was found previously.   Most routine colon cancer screening begins at the age of 62. On a yearly basis, doctors may provide special easy to use take-home tests to check for hidden blood in the stool. Sigmoidoscopy or colonoscopy can detect the earliest forms of colon cancer and is life saving. These tests use a small camera at the end of a tube to directly examine the colon. Speak to your caregiver about this at age 60, when routine screening begins (and is repeated every 5 years unless early forms of pre-cancerous polyps or small  growths are found).

## 2014-04-14 NOTE — Progress Notes (Signed)
Subjective:   Emily Jenkins is a 68 y.o. female who presents for Medicare Annual Wellness Visit and 3 month follow up on hypertension, prediabetes, hyperlipidemia, vitamin D def.  Date of last medicare wellness visit is unknown.   Her blood pressure has been controlled at home, today their BP is BP: 120/68 mmHg She does workout. She denies shortness of breath, dizziness.  She describes a throat tightness, fatigue, and chest discomfort with walking quickly or when she gets emotional. She states she burps and belches and she does not take her omeprazole daily.  She is not on cholesterol medication and denies myalgias. Her cholesterol is at goal. The cholesterol last visit was:   Lab Results  Component Value Date   CHOL 177 01/08/2014   HDL 65 01/08/2014   LDLCALC 96 01/08/2014   TRIG 79 01/08/2014   CHOLHDL 2.7 01/08/2014   She has been working on diet and exercise for prediabetes, and denies paresthesia of the feet, polydipsia and polyuria. Last A1C in the office was:  Lab Results  Component Value Date   HGBA1C 5.9* 01/08/2014   Patient is on Vitamin D supplement.  Names of Other Physician/Practitioners you currently use: 1. [facility]- here for primary care 2. [facility] eye doctor, last visit 2 years 3. Dr. Wynetta Emery, dentist, last visit Jan 2015 4. Dr. Collene Mares 5. Dr. Lorin Mercy Patient Care Team: Unk Pinto, MD as PCP - General (Internal Medicine)  Medication Review Current Outpatient Prescriptions on File Prior to Visit  Medication Sig Dispense Refill  . aspirin 81 MG tablet Take 81 mg by mouth daily.      . cetirizine (ZYRTEC) 10 MG tablet Take 10 mg by mouth as needed.      . Cholecalciferol (VITAMIN D-3) 1000 UNITS CAPS Take 2,000 Units by mouth daily.      Marland Kitchen omeprazole (PRILOSEC) 40 MG capsule Take 40 mg by mouth daily.      . simvastatin (ZOCOR) 20 MG tablet Take 20 mg by mouth every evening.      . valsartan-hydrochlorothiazide  (DIOVAN-HCT) 320-25 MG per tablet TAKE ONE TABLET BY MOUTH EVERY DAY FOR BLOOD PRESSURE  30 tablet  3   No current facility-administered medications on file prior to visit.    Current Problems (verified) Patient Active Problem List   Diagnosis Date Noted  . Encounter for long-term (current) use of other medications 01/05/2014  . Unspecified essential hypertension 12/02/2013  . Mixed hyperlipidemia 12/02/2013  . Other abnormal glucose 12/02/2013  . Unspecified vitamin D deficiency 12/02/2013  . Varicose veins of lower extremities with other complications 09/26/5101    Screening Tests Health Maintenance  Topic Date Due  . Mammogram  08/21/1995  . Colonoscopy  08/21/1995  . Pneumococcal Polysaccharide Vaccine Age 38 And Over  08/20/2010  . Influenza Vaccine  07/11/2014  . Tetanus/tdap  07/11/2022  . Zostavax  Completed     Immunization History  Administered Date(s) Administered  . DTaP 12/11/2000  . Influenza Split 10/24/2013  . Pneumococcal Polysaccharide-23 12/11/2002  . Tdap 07/11/2012  . Zoster 08/06/2013    Preventative care: Last colonoscopy: 2011 Last mammogram: 07/2013 at [place] Last pap smear/pelvic exam: 2010 DEXA: 2013 due 09/2014  Prior vaccinations: TD or Tdap: 2013  Influenza: 2014 Pneumococcal: 2004 Shingles/Zostavax: 2014  History reviewed: allergies, current medications, past family history, past medical history, past social history, past surgical history and problem list  Risk Factors: Osteoporosis: postmenopausal estrogen deficiency and dietary calcium and/or vitamin D deficiency  History of fracture in the past year: no  Tobacco History  Substance Use Topics  . Smoking status: Never Smoker   . Smokeless tobacco: Not on file  . Alcohol Use: No   She does not smoke.  Patient is not a former smoker. Are there smokers in your home (other than you)?  No  Alcohol Current alcohol use: none  Caffeine Current caffeine use: coffee 1  /day  Exercise Exercise limitations: The patient is experiencing exercise intolerance (chest discomfort). Current exercise: walking  Nutrition/Diet Current diet: in general, a "healthy" diet    Cardiac risk factors: advanced age (older than 76 for men, 10 for women), dyslipidemia, family history of premature cardiovascular disease, hypertension, obesity (BMI >= 30 kg/m2) and sedentary lifestyle.  Depression Screen (Note: if answer to either of the following is "Yes", a more complete depression screening is indicated)   Q1: Over the past two weeks, have you felt down, depressed or hopeless? No  Q2: Over the past two weeks, have you felt little interest or pleasure in doing things? No  Have you lost interest or pleasure in daily life? No  Do you often feel hopeless? No  Do you cry easily over simple problems? No  Activities of Daily Living In your present state of health, do you have any difficulty performing the following activities?:  Driving? No Managing money?  No Feeding yourself? No Getting from bed to chair? No Climbing a flight of stairs? No Preparing food and eating?: No Bathing or showering? No Getting dressed: No Getting to the toilet? No Using the toilet:No Moving around from place to place: No In the past year have you fallen or had a near fall?:No   Are you sexually active?  No  Do you have more than one partner?  No  Vision Difficulties: No  Hearing Difficulties: No Do you often ask people to speak up or repeat themselves? No Do you experience ringing or noises in your ears? No Do you have difficulty understanding soft or whispered voices? No  Cognition  Do you feel that you have a problem with memory?No  Do you often misplace items? No  Do you feel safe at home?  Yes  Advanced directives Does patient have a [place]? No Does patient have a Living Will? No   Objective:   Blood pressure 120/68, pulse 72, temperature 97.9 F  (36.6 C), resp. rate 16, weight 166 lb (75.297 kg). Body mass index is 27.19 kg/(m^2).  General appearance: alert, no distress, WD/WN,  female Cognitive Testing  Alert? Yes  Normal Appearance?Yes  Oriented to person? Yes  Place? Yes   Time? Yes  Recall of three objects?  Yes  Can perform simple calculations? Yes  Displays appropriate judgment?Yes  Can read the correct time from a watch face?Yes  HEENT: normocephalic, sclerae anicteric, TMs pearly, nares patent, no discharge or erythema, pharynx normal Oral cavity: MMM, no lesions Neck: supple, no lymphadenopathy, no thyromegaly, no masses Heart: RRR, normal S1, S2, no murmurs Lungs: CTA bilaterally, no wheezes, rhonchi, or rales Abdomen: +bs, soft, tender epigastric, non distended, no masses, no hepatomegaly, no splenomegaly Musculoskeletal: nontender, no swelling, no obvious deformity Extremities: no edema, no cyanosis, no clubbing Pulses: 2+ symmetric, upper and lower extremities, normal cap refill Neurological: alert, oriented x 3, CN2-12 intact, strength normal upper extremities and lower extremities, sensation normal throughout, DTRs 2+ throughout, no cerebellar signs, gait normal Psychiatric: normal affect, behavior normal, pleasant  Breast: defer Gyn: defer  Rectal: defer   Assessment:   1. Unspecified essential hypertension - CBC with Differential - BASIC METABOLIC PANEL WITH GFR - Hepatic function panel - TSH  2. Mixed hyperlipidemia - Lipid panel  3. Other abnormal glucose Discussed general issues about diabetes pathophysiology and management., Educational material distributed., Suggested low cholesterol diet., Encouraged aerobic exercise., Discussed foot care., Reminded to get yearly retinal exam. - Hemoglobin A1c - Insulin, fasting - HM DIABETES FOOT EXAM  4. Unspecified vitamin D deficiency - Vit D  25 hydroxy (rtn osteoporosis monitoring)  5. Encounter for long-term (current) use of other medications -  Magnesium  6. Chest pain- ? GERD versus chest pain- does have exertional component, family history, no EKG changes but will refer to cardio Mother with stent in 22's - EKG 12-Lead- no ST changes - take Prilosec daily -if any worsening pain go to the ER   Plan:   During the course of the visit the patient was educated and counseled about appropriate screening and preventive services including:    Pneumococcal vaccine   Influenza vaccine  Td vaccine  Screening electrocardiogram  Screening mammography  Bone densitometry screening  Colorectal cancer screening  Diabetes screening  Glaucoma screening  Nutrition counseling   Advanced directives: given information  Screening recommendations, referrals:  Vaccinations: Tdap vaccine not indicated Influenza vaccine next year Pneumococcal vaccine not indicated Shingles vaccine not indicated Hep B vaccine not indicated  Nutrition assessed and recommended  Colonoscopy due 2022 Mammogram due 07/2014 Pap smear not indicated Pelvic exam not indicated Recommended yearly ophthalmology/optometry visit for glaucoma screening and checkup Recommended yearly dental visit for hygiene and checkup Advanced directives - given info  Conditions/risks identified: BMI: Discussed weight loss, diet, and increase physical activity.  Increase physical activity: AHA recommends 150 minutes of physical activity a week.  Medications reviewed DEXA- 09/2014 Diabetes is at goal Urinary Incontinence is not an issue: discussed non pharmacology and pharmacology options.  Fall risk: low- discussed PT, home fall assessment, medications.   Medicare Attestation I have personally reviewed: The patient's medical and social history Their use of alcohol, tobacco or illicit drugs Their current medications and supplements The patient's functional ability including ADLs,fall risks, home safety risks, cognitive, and hearing and visual impairment Diet and  physical activities Evidence for depression or mood disorders  The patient's weight, height, BMI, and visual acuity have been recorded in the chart.  I have made referrals, counseling, and provided education to the patient based on review of the above and I have provided the patient with a written personalized care plan for preventive services.     Vicie Mutters, PA-C   04/14/2014

## 2014-04-15 LAB — HEPATIC FUNCTION PANEL
ALT: 8 U/L (ref 0–35)
AST: 16 U/L (ref 0–37)
Albumin: 4.2 g/dL (ref 3.5–5.2)
Alkaline Phosphatase: 71 U/L (ref 39–117)
BILIRUBIN TOTAL: 0.6 mg/dL (ref 0.2–1.2)
Bilirubin, Direct: 0.1 mg/dL (ref 0.0–0.3)
Indirect Bilirubin: 0.5 mg/dL (ref 0.2–1.2)
Total Protein: 6.9 g/dL (ref 6.0–8.3)

## 2014-04-15 LAB — BASIC METABOLIC PANEL WITH GFR
BUN: 11 mg/dL (ref 6–23)
CO2: 28 mEq/L (ref 19–32)
Calcium: 9.9 mg/dL (ref 8.4–10.5)
Chloride: 104 mEq/L (ref 96–112)
Creat: 0.77 mg/dL (ref 0.50–1.10)
GFR, Est Non African American: 80 mL/min
Glucose, Bld: 89 mg/dL (ref 70–99)
Potassium: 4.1 mEq/L (ref 3.5–5.3)
SODIUM: 142 meq/L (ref 135–145)

## 2014-04-15 LAB — VITAMIN D 25 HYDROXY (VIT D DEFICIENCY, FRACTURES): VIT D 25 HYDROXY: 52 ng/mL (ref 30–89)

## 2014-04-15 LAB — LIPID PANEL
CHOL/HDL RATIO: 2.4 ratio
CHOLESTEROL: 159 mg/dL (ref 0–200)
HDL: 67 mg/dL (ref >39)
LDL Cholesterol: 78 mg/dL (ref 0–99)
TRIGLYCERIDES: 70 mg/dL (ref <150)
VLDL: 14 mg/dL (ref 0–40)

## 2014-04-15 LAB — MAGNESIUM: Magnesium: 2.2 mg/dL (ref 1.5–2.5)

## 2014-04-15 LAB — TSH: TSH: 1.644 u[IU]/mL (ref 0.350–4.500)

## 2014-04-15 LAB — INSULIN, FASTING: Insulin fasting, serum: 7 u[IU]/mL (ref 3–28)

## 2014-05-07 ENCOUNTER — Encounter: Payer: Self-pay | Admitting: Cardiology

## 2014-05-07 ENCOUNTER — Ambulatory Visit (INDEPENDENT_AMBULATORY_CARE_PROVIDER_SITE_OTHER): Payer: Medicare PPO | Admitting: Cardiology

## 2014-05-07 VITALS — BP 130/68 | HR 77 | Ht 65.0 in | Wt 166.0 lb

## 2014-05-07 DIAGNOSIS — I10 Essential (primary) hypertension: Secondary | ICD-10-CM

## 2014-05-07 DIAGNOSIS — R079 Chest pain, unspecified: Secondary | ICD-10-CM

## 2014-05-07 NOTE — Progress Notes (Signed)
HPI The patient presents as a new patient for evaluation of chest discomfort.  She has no prior cardiac history but does have some cardiovascular risk factors. She reports that for about 6 months she's been getting sporadic chest discomfort. This happens when she is emotionally stressed or at rest. It's about once a week. It is not happening daily. She is more fatigued than she used to be. She will get tired when she is vacuuming. She does not necessarily bring on his chest discomfort however. He is not describing neck or arm discomfort. She doesn't describe any associated symptoms such as nausea vomiting or diaphoresis. She will get occasional palpitations but no presyncope or syncope. She's not had any PND or orthopnea.  Allergies  Allergen Reactions  . Fosamax [Alendronate Sodium]   . Sulfa Antibiotics     Current Outpatient Prescriptions  Medication Sig Dispense Refill  . aspirin 81 MG tablet Take 81 mg by mouth daily.      . Calcium Carbonate-Vit D-Min (CALTRATE 600+D PLUS PO) Take by mouth. Plus vitamin D3  Take 1 tab twice a day      . cetirizine (ZYRTEC) 10 MG tablet Take 10 mg by mouth as needed.      Marland Kitchen omeprazole (PRILOSEC) 40 MG capsule Take 40 mg by mouth daily.      Marland Kitchen OVER THE COUNTER MEDICATION Vitafushion v b12 500mg  1 tab twice a day      . OVER THE COUNTER MEDICATION Vitafushion omega -3  daily      . simvastatin (ZOCOR) 20 MG tablet Take 20 mg by mouth every evening.      . valsartan-hydrochlorothiazide (DIOVAN-HCT) 320-25 MG per tablet TAKE ONE TABLET BY MOUTH EVERY DAY FOR BLOOD PRESSURE  30 tablet  3   No current facility-administered medications for this visit.    Past Medical History  Diagnosis Date  . Hypertension   . Hyperlipidemia   . Pre-diabetes   . GERD (gastroesophageal reflux disease)   . RLS (restless legs syndrome)   . DJD (degenerative joint disease)     Past Surgical History  Procedure Laterality Date  . Cholecystectomy    . Tubal ligation      . Ankle surgery      Family History  Problem Relation Age of Onset  . Heart disease Mother   . Hypertension Mother   . Cancer Father   . Diabetes Father   . Hyperlipidemia Sister   . Hypertension Sister     History   Social History  . Marital Status: Married    Spouse Name: N/A    Number of Children: N/A  . Years of Education: N/A   Occupational History  . Not on file.   Social History Main Topics  . Smoking status: Never Smoker   . Smokeless tobacco: Not on file  . Alcohol Use: No  . Drug Use: No  . Sexual Activity: Yes   Other Topics Concern  . Not on file   Social History Narrative  . No narrative on file    ROS:  Positive for constipation, reflux, urinary frequency, leg cramping, joint pains. 05/07/2014  PHYSICAL EXAM BP 130/68  Pulse 77  Ht 5\' 5"  (1.651 m)  Wt 166 lb (75.297 kg)  BMI 27.62 kg/m2 GENERAL:  Well appearing HEENT:  Pupils equal round and reactive, fundi not visualized, oral mucosa unremarkable NECK:  No jugular venous distention, waveform within normal limits, carotid upstroke brisk and symmetric, no bruits, no thyromegaly LYMPHATICS:  No cervical, inguinal adenopathy LUNGS:  Clear to auscultation bilaterally BACK:  No CVA tenderness CHEST:  Unremarkable HEART:  PMI not displaced or sustained,S1 and S2 within normal limits, no S3, no S4, no clicks, no rubs, no murmurs ABD:  Flat, positive bowel sounds normal in frequency in pitch, no bruits, no rebound, no guarding, no midline pulsatile mass, no hepatomegaly, no splenomegaly EXT:  2 plus pulses throughout, no edema, no cyanosis no clubbing SKIN:  No rashes no nodules NEURO:  Cranial nerves II through XII grossly intact, motor grossly intact throughout PSYCH:  Cognitively intact, oriented to person place and time  EKG:  Regular rhythm, unusual P wave axis, possible ectopic atrial rhythm, no acute ST-T wave changes.  05/07/2014   ASSESSMENT AND PLAN  CHEST PAIN:  The pain is atypical.  However, she does have cardiovascular risk factors. I will bring the patient back for a POET (Plain Old Exercise Test). This will allow me to screen for obstructive coronary disease, risk stratify and very importantly provide a prescription for exercise.  HTN:  The blood pressure is at target. No change in medications is indicated. We will continue with therapeutic lifestyle changes (TLC).

## 2014-05-07 NOTE — Patient Instructions (Signed)
The current medical regimen is effective;  continue present plan and medications.  Your physician has requested that you have an exercise tolerance test. For further information please visit [email or web address]. Please also follow instruction sheet, as given.

## 2014-06-04 ENCOUNTER — Telehealth: Payer: Self-pay

## 2014-06-04 NOTE — Telephone Encounter (Signed)
ERROR

## 2014-06-17 ENCOUNTER — Other Ambulatory Visit: Payer: Self-pay | Admitting: Nurse Practitioner

## 2014-06-17 ENCOUNTER — Ambulatory Visit: Payer: Medicare PPO | Admitting: Nurse Practitioner

## 2014-06-17 DIAGNOSIS — R079 Chest pain, unspecified: Secondary | ICD-10-CM

## 2014-06-17 DIAGNOSIS — R0789 Other chest pain: Principal | ICD-10-CM

## 2014-06-17 NOTE — Progress Notes (Deleted)
Exercise Treadmill Test  Pre-Exercise Testing Evaluation Rhythm: {CHL RHYTHM BASELINE EKG FOR YOV:78588502}  Rate: {CHL RATE BASELINE EKG FOR DXA:12878676}     Test  Exercise Tolerance Test Ordering MD: Marijo File, MD  Interpreting MD: Truitt Merle, NP  Unique Test No: 1  Treadmill:  1  Indication for ETT: chest pain - rule out ischemia  Contraindication to ETT: No   Stress Modality: exercise - treadmill  Cardiac Imaging Performed: non   Protocol: standard Bruce - maximal  Max BP:  ***/***  Max MPHR (bpm):  152 85% MPR (bpm):  129  MPHR obtained (bpm):  *** % MPHR obtained:  ***  Reached 85% MPHR (min:sec):  *** Total Exercise Time (min-sec):  ***  Workload in METS:  *** Borg Scale: ***  Reason ETT Terminated:  {CHL REASON TERMINATED FOR [facility]:94709628}    ST Segment Analysis At Rest: {CHL ST SEGMENT AT REST FOR ZMO:29476546} With Exercise: {CHL ST SEGMENT WITH EXERCISE FOR TKP:54656812}  Other Information Arrhythmia:  {CHL ARRHYTHMIA FOR XNT:70017494} Angina during ETT:  {CHL ANGINA DURING WHQ:75916384} Quality of ETT:  {CHL QUALITY OF YKZ:99357017}  ETT Interpretation:  {CHL INTERPRETATION FOR BLT:90300923}  Comments: ***  Recommendations: ***

## 2014-07-13 ENCOUNTER — Encounter (HOSPITAL_COMMUNITY): Payer: Medicare PPO

## 2014-07-14 ENCOUNTER — Other Ambulatory Visit: Payer: Self-pay | Admitting: Internal Medicine

## 2014-07-22 ENCOUNTER — Ambulatory Visit (HOSPITAL_COMMUNITY): Payer: Medicare PPO | Attending: Nurse Practitioner | Admitting: Radiology

## 2014-07-22 VITALS — BP 124/76 | HR 69 | Ht 65.0 in | Wt 163.0 lb

## 2014-07-22 DIAGNOSIS — I10 Essential (primary) hypertension: Secondary | ICD-10-CM | POA: Insufficient documentation

## 2014-07-22 DIAGNOSIS — R079 Chest pain, unspecified: Secondary | ICD-10-CM

## 2014-07-22 DIAGNOSIS — R0789 Other chest pain: Secondary | ICD-10-CM

## 2014-07-22 DIAGNOSIS — Z8249 Family history of ischemic heart disease and other diseases of the circulatory system: Secondary | ICD-10-CM | POA: Diagnosis not present

## 2014-07-22 MED ORDER — TECHNETIUM TC 99M SESTAMIBI GENERIC - CARDIOLITE
10.0000 | Freq: Once | INTRAVENOUS | Status: AC | PRN
  Administered 2014-07-22: 10 via INTRAVENOUS

## 2014-07-22 MED ORDER — TECHNETIUM TC 99M SESTAMIBI GENERIC - CARDIOLITE
30.0000 | Freq: Once | INTRAVENOUS | Status: AC | PRN
  Administered 2014-07-22: 30 via INTRAVENOUS

## 2014-07-22 MED ORDER — REGADENOSON 0.4 MG/5ML IV SOLN
0.4000 mg | Freq: Once | INTRAVENOUS | Status: AC
  Administered 2014-07-22: 0.4 mg via INTRAVENOUS

## 2014-07-22 NOTE — Progress Notes (Signed)
[facility] [address] [place], [place] [address] [phone]    Cardiology Nuclear Med Study  Emily Jenkins is a 68 y.o. female     MRN : [id number]     DOB: 21-Aug-1946  Procedure Date: 07/22/2014  Nuclear Med Background Indication for Stress Test:  Evaluation for Ischemia History:  No known CAD Cardiac Risk Factors: Family History - CAD, Hypertension and Lipids  Symptoms:  Chest Pain (last date of chest discomfort was last night), Fatigue and Palpitations   Nuclear Pre-Procedure Caffeine/Decaff Intake:  None> 12 hrs NPO After: 10:00pm   Lungs:  clear O2 Sat: 98% on room air. IV 0.9% NS with Angio Cath:  22g  IV Site: R Wrist x 1, tolerated well IV Started by:  Irven Baltimore, RN  Chest Size (in):  36 Cup Size: C  Height: 5\' 5"  (1.651 m)  Weight:  163 lb (73.936 kg)  BMI:  Body mass index is 27.12 kg/(m^2). Tech Comments:  N/A    Nuclear Med Study 1 or 2 day study: 1 day  Stress Test Type:  Lexiscan  Reading MD: N/A  Order Authorizing Provider:  Minus Breeding, MD, and Truitt Merle, NP  Resting Radionuclide: Technetium 57m Sestamibi  Resting Radionuclide Dose: 11.0 mCi   Stress Radionuclide:  Technetium 16m Sestamibi  Stress Radionuclide Dose: 33.0 mCi           Stress Protocol Rest HR: 69 Stress HR: 98  Rest BP: 124/76 Stress BP: 124/63  Exercise Time (min): n/a METS: n/a           Dose of Adenosine (mg):  n/a Dose of Lexiscan: 0.4 mg  Dose of Atropine (mg): n/a Dose of Dobutamine: n/a mcg/kg/min (at max HR)  Stress Test Technologist: Glade Lloyd, BS-ES  Nuclear Technologist:  Annye Rusk, CNMT     Rest Procedure:  Myocardial perfusion imaging was performed at rest 45 minutes following the intravenous administration of Technetium 16m Sestamibi. Rest ECG: NSR - Normal EKG  Stress Procedure:  The patient received IV Lexiscan 0.4 mg over 15-seconds.  Technetium 46m Sestamibi injected at 30-seconds.  Quantitative spect  images were obtained after a 45 minute delay.  During the infusion of Lexiscan the patient complained of feeling woozy, flushed and SOB.  These symptoms began to resolve in recovery.  Stress ECG: No significant change from baseline ECG  QPS Raw Data Images:  There is interference from nuclear activity from structures below the diaphragm. This does not affect the ability to read the study. Stress Images:  There is decreased uptake in the basal, mid and distal inferior wall.  Rest Images:  Normal homogeneous uptake in all areas of the myocardium. Subtraction (SDS):  Medium size, moderate severity reversible defect in the entire inferior wall (SDS 6).  Transient Ischemic Dilatation (Normal <1.22):  1.00 Lung/Heart Ratio (Normal <0.45):  0.32  Quantitative Gated Spect Images QGS EDV:  84 ml QGS ESV:  32 ml  Impression Exercise Capacity:  Lexiscan with no exercise. BP Response:  Normal blood pressure response. Clinical Symptoms:  There is dyspnea. ECG Impression:  No significant ST segment change suggestive of ischemia. Comparison with Prior Nuclear Study: No previous nuclear study performed  Overall Impression:  Intermediate risk stress nuclear study with medium size moderate severity ischemia in the PDA RCA/PDA territory (SDS 6). .  LV Ejection Fraction: 62%.  LV Wall Motion:  NL LV Function; NL Wall Motion  Emily Jenkins 07/22/2014

## 2014-07-27 ENCOUNTER — Encounter: Payer: Self-pay | Admitting: Cardiovascular Disease

## 2014-07-27 ENCOUNTER — Telehealth: Payer: Self-pay | Admitting: Cardiology

## 2014-07-27 ENCOUNTER — Encounter (HOSPITAL_COMMUNITY): Payer: Self-pay | Admitting: Pharmacy Technician

## 2014-07-27 NOTE — Telephone Encounter (Signed)
Spoke with patient regarding cardiac cath ordered by Dr. Percival Spanish.  This is scheduled for Wednesday 07/29/14 @ 11:30 am---pt to arrive at Short Stay at [facility] @ 9:30am---NPO after midnight except for morning medications.  I also told patient to be sure to take an overnight bag in case the physician had to open one of her vessels or put in a stent and that she would need someone to drive her home.  Patient voiced her understanding.

## 2014-07-28 ENCOUNTER — Other Ambulatory Visit: Payer: Self-pay | Admitting: *Deleted

## 2014-07-28 DIAGNOSIS — Z01812 Encounter for preprocedural laboratory examination: Secondary | ICD-10-CM

## 2014-07-29 ENCOUNTER — Encounter (HOSPITAL_COMMUNITY)
Admission: RE | Disposition: A | Discharge status: Home / Self Care | Payer: Self-pay | Source: Ambulatory Visit | Attending: Cardiovascular Disease

## 2014-07-29 ENCOUNTER — Ambulatory Visit (HOSPITAL_COMMUNITY): Payer: Medicare PPO

## 2014-07-29 ENCOUNTER — Ambulatory Visit (HOSPITAL_COMMUNITY)
Admission: RE | Admit: 2014-07-29 | Discharge: 2014-07-29 | Disposition: A | Discharge status: Home / Self Care | Payer: Medicare PPO | Source: Ambulatory Visit | Attending: Cardiovascular Disease | Admitting: Cardiovascular Disease

## 2014-07-29 DIAGNOSIS — E119 Type 2 diabetes mellitus without complications: Secondary | ICD-10-CM | POA: Diagnosis not present

## 2014-07-29 DIAGNOSIS — I10 Essential (primary) hypertension: Secondary | ICD-10-CM | POA: Insufficient documentation

## 2014-07-29 DIAGNOSIS — R0789 Other chest pain: Secondary | ICD-10-CM | POA: Diagnosis not present

## 2014-07-29 DIAGNOSIS — E785 Hyperlipidemia, unspecified: Secondary | ICD-10-CM | POA: Insufficient documentation

## 2014-07-29 DIAGNOSIS — Z7982 Long term (current) use of aspirin: Secondary | ICD-10-CM | POA: Diagnosis not present

## 2014-07-29 DIAGNOSIS — Z01812 Encounter for preprocedural laboratory examination: Secondary | ICD-10-CM

## 2014-07-29 DIAGNOSIS — R079 Chest pain, unspecified: Secondary | ICD-10-CM | POA: Diagnosis present

## 2014-07-29 DIAGNOSIS — K219 Gastro-esophageal reflux disease without esophagitis: Secondary | ICD-10-CM | POA: Diagnosis not present

## 2014-07-29 HISTORY — PX: LEFT HEART CATHETERIZATION WITH CORONARY ANGIOGRAM: SHX5451

## 2014-07-29 LAB — BASIC METABOLIC PANEL
ANION GAP: 14 (ref 5–15)
BUN: 11 mg/dL (ref 6–23)
CALCIUM: 10.1 mg/dL (ref 8.4–10.5)
CO2: 29 meq/L (ref 19–32)
CREATININE: 0.72 mg/dL (ref 0.50–1.10)
Chloride: 103 mEq/L (ref 96–112)
GFR calc Af Amer: >90 mL/min (ref >90)
GFR calc non Af Amer: 86 mL/min — ABNORMAL LOW (ref >90)
GLUCOSE: 102 mg/dL — AB (ref 70–99)
Potassium: 3.6 mEq/L — ABNORMAL LOW (ref 3.7–5.3)
Sodium: 146 mEq/L (ref 137–147)

## 2014-07-29 LAB — CBC
HCT: 35.8 % — ABNORMAL LOW (ref 36.0–46.0)
Hemoglobin: 12.1 g/dL (ref 12.0–15.0)
MCH: 26.7 pg (ref 26.0–34.0)
MCHC: 33.8 g/dL (ref 30.0–36.0)
MCV: 78.9 fL (ref 78.0–100.0)
PLATELETS: 207 10*3/uL (ref 150–400)
RBC: 4.54 MIL/uL (ref 3.87–5.11)
RDW: 13.9 % (ref 11.5–15.5)
WBC: 2.6 10*3/uL — ABNORMAL LOW (ref 4.0–10.5)

## 2014-07-29 LAB — PROTIME-INR
INR: 1.05 (ref 0.00–1.49)
PROTHROMBIN TIME: 13.7 s (ref 11.6–15.2)

## 2014-07-29 SURGERY — LEFT HEART CATHETERIZATION WITH CORONARY ANGIOGRAM
Anesthesia: LOCAL

## 2014-07-29 MED ORDER — ASPIRIN 81 MG PO CHEW
81.0000 mg | CHEWABLE_TABLET | ORAL | Status: AC
  Administered 2014-07-29: 81 mg via ORAL

## 2014-07-29 MED ORDER — HEPARIN (PORCINE) IN NACL 2-0.9 UNIT/ML-% IJ SOLN
INTRAMUSCULAR | Status: AC
  Filled 2014-07-29: qty 1000

## 2014-07-29 MED ORDER — HEPARIN SODIUM (PORCINE) 1000 UNIT/ML IJ SOLN
INTRAMUSCULAR | Status: AC
Start: 2014-07-29 — End: 2014-07-29
  Filled 2014-07-29: qty 1

## 2014-07-29 MED ORDER — ASPIRIN 81 MG PO CHEW
CHEWABLE_TABLET | ORAL | Status: AC
  Filled 2014-07-29: qty 1

## 2014-07-29 MED ORDER — SODIUM CHLORIDE 0.9 % IJ SOLN: 3.0000 mL | INTRAMUSCULAR | Status: DC | PRN

## 2014-07-29 MED ORDER — SODIUM CHLORIDE 0.9 % IV SOLN
INTRAVENOUS | Status: DC
  Administered 2014-07-29: 11:00:00 via INTRAVENOUS

## 2014-07-29 MED ORDER — ACETAMINOPHEN 325 MG PO TABS
ORAL_TABLET | ORAL | Status: AC
  Filled 2014-07-29: qty 2

## 2014-07-29 MED ORDER — LIDOCAINE HCL (PF) 1 % IJ SOLN
INTRAMUSCULAR | Status: AC
  Filled 2014-07-29: qty 30

## 2014-07-29 MED ORDER — NITROGLYCERIN 1 MG/10 ML FOR IR/CATH LAB
INTRA_ARTERIAL | Status: AC
  Filled 2014-07-29: qty 10

## 2014-07-29 MED ORDER — MIDAZOLAM HCL 2 MG/2ML IJ SOLN
INTRAMUSCULAR | Status: AC
  Filled 2014-07-29: qty 2

## 2014-07-29 MED ORDER — FENTANYL CITRATE 0.05 MG/ML IJ SOLN
INTRAMUSCULAR | Status: AC
  Filled 2014-07-29: qty 2

## 2014-07-29 MED ORDER — VERAPAMIL HCL 2.5 MG/ML IV SOLN
INTRAVENOUS | Status: AC
  Filled 2014-07-29: qty 2

## 2014-07-29 MED ORDER — SODIUM CHLORIDE 0.9 % IV SOLN: INTRAVENOUS | Status: AC

## 2014-07-29 MED ORDER — ACETAMINOPHEN 325 MG PO TABS
650.0000 mg | ORAL_TABLET | Freq: Four times a day (QID) | ORAL | Status: DC | PRN
  Administered 2014-07-29: 650 mg via ORAL

## 2014-07-29 NOTE — Discharge Instructions (Signed)
Radial Site Care °Refer to this sheet in the next few weeks. These instructions provide you with information on caring for yourself after your procedure. Your caregiver may also give you more specific instructions. Your treatment has been planned according to current medical practices, but problems sometimes occur. Call your caregiver if you have any problems or questions after your procedure. °HOME CARE INSTRUCTIONS °· You may shower the day after the procedure. Remove the bandage (dressing) and gently wash the site with plain soap and water. Gently pat the site dry. °· Do not apply powder or lotion to the site. °· Do not submerge the affected site in water for 3 to 5 days. °· Inspect the site at least twice daily. °· Do not flex or bend the affected arm for 24 hours. °· No lifting over 5 pounds (2.3 kg) for 5 days after your procedure. °· Do not drive home if you are discharged the same day of the procedure. Have someone else drive you. °· You may drive 24 hours after the procedure unless otherwise instructed by your caregiver. °· Do not operate machinery or power tools for 24 hours. °· A responsible adult should be with you for the first 24 hours after you arrive home. °What to expect: °· Any bruising will usually fade within 1 to 2 weeks. °· Blood that collects in the tissue (hematoma) may be painful to the touch. It should usually decrease in size and tenderness within 1 to 2 weeks. °SEEK IMMEDIATE MEDICAL CARE IF: °· You have unusual pain at the radial site. °· You have redness, warmth, swelling, or pain at the radial site. °· You have drainage (other than a small amount of blood on the dressing). °· You have chills. °· You have a fever or persistent symptoms for more than 72 hours. °· You have a fever and your symptoms suddenly get worse. °· Your arm becomes pale, cool, tingly, or numb. °· You have heavy bleeding from the site. Hold pressure on the site. °Document Released: 12/30/2010 Document Revised:  02/19/2012 Document Reviewed: 12/30/2010 °ExitCare® Patient Information ©2015 [employer]. This information is not intended to replace advice given to you by your health care provider. Make sure you discuss any questions you have with your health care provider. ° °

## 2014-07-29 NOTE — CV Procedure (Signed)
      Cardiac Catheterization Operative Report  Emily Jenkins [id number] 8/19/20151:10 PM MCKEOWN,WILLIAM DAVID, MD  Procedure Performed:  1. Left Heart Catheterization 2. Selective Coronary Angiography 3. Left ventricular angiogram  Operator: Lauree Chandler, MD  Arterial access site:  Right radial artery.   Indication: 68 yo female with history of HTN, HLD, DM with recent chest pain, abnormal stress test.                                       Procedure Details: The risks, benefits, complications, treatment options, and expected outcomes were discussed with the patient. The patient and/or family concurred with the proposed plan, giving informed consent. The patient was brought to the cath lab after IV hydration was begun and oral premedication was given. The patient was further sedated with Versed and Fentanyl. The right wrist was assessed with a reverse Allens test which was positive. The right wrist was prepped and draped in a sterile fashion. 1% lidocaine was used for local anesthesia. Using the modified Seldinger access technique, a 5 French sheath was placed in the right radial artery. 3 mg Verapamil was given through the sheath. 4000 units IV heparin was given. Standard diagnostic catheters were used to perform selective coronary angiography. A pigtail catheter was used to perform a left ventricular angiogram. The sheath was removed from the right radial artery and a Terumo hemostasis band was applied at the arteriotomy site on the right wrist.    There were no immediate complications. The patient was taken to the recovery area in stable condition.   Hemodynamic Findings: Central aortic pressure: 138/63 Left ventricular pressure: 147/5/13  Angiographic Findings:  Left main: No obstructive disease.   Left Anterior Descending Artery: Large caliber vessel that courses to the apex. There are several small caliber diagonal branches. No obstructive disease.     Circumflex Artery: Large caliber vessel that terminates into a large obtuse marginal branch. No obstructive disease.   Right Coronary Artery: Large dominant vessel with no obstructive disease.   Left Ventricular Angiogram: LVEF=65%.   Impression: 1. No angiographic evidence of CAD 2. Normal LV function 3. Non-cardiac chest pain  Recommendations: NO further ischemic workup.        Complications:  None. The patient tolerated the procedure well.

## 2014-07-29 NOTE — Progress Notes (Signed)
TR BAND REMOVAL  LOCATION:    right radial  DEFLATED PER PROTOCOL:    Yes.    TIME BAND OFF / DRESSING APPLIED:    1515   SITE UPON ARRIVAL:    Level 0  SITE AFTER BAND REMOVAL:    Level 0  CIRCULATION SENSATION AND MOVEMENT:    Within Normal Limits   Yes.    COMMENTS:   TEGADERM DSG APPLIED/ SITE UNREMARKABLE.

## 2014-07-29 NOTE — H&P (Signed)
Patient ID: Emily Jenkins MRN: [id number] DOB/AGE: [birth year] 68 y.o. Admit date: 07/29/2014  Primary Cardiologist: Hochrein  HPI: 68 yo WF with h/o HTN, HLD, DM with recent chest pain c/w unstable angina. Stress myoview 07/22/14 intermediate risk with possible inferior wall ischemia. Cardiac cath arranged by Dr. Percival Spanish who saw her in the office in may 2015.   Review of systems complete and found to be negative unless listed above   Past Medical History  Diagnosis Date  . Hypertension   . Hyperlipidemia   . Pre-diabetes   . GERD (gastroesophageal reflux disease)   . RLS (restless legs syndrome)   . DJD (degenerative joint disease)     Family History  Problem Relation Age of Onset  . Heart disease Mother 28    stent  . Hypertension Mother   . Cancer Father   . Diabetes Father   . Hyperlipidemia Sister   . Hypertension Sister     History   Social History  . Marital Status: Married    Spouse Name: Emily Jenkins    Number of Children: 3  . Years of Education: Emily Jenkins   Occupational History  . Not on file.   Social History Main Topics  . Smoking status: Never Smoker   . Smokeless tobacco: Not on file  . Alcohol Use: No  . Drug Use: No  . Sexual Activity: Yes   Other Topics Concern  . Not on file   Social History Narrative   Lives with second husband.     Past Surgical History  Procedure Laterality Date  . Cholecystectomy    . Tubal ligation    . Ankle surgery      Allergies  Allergen Reactions  . Fosamax [Alendronate Sodium] Itching  . Sulfa Antibiotics Itching    Prior to Admission Meds:  Prescriptions prior to admission  Medication Sig Dispense Refill  . aspirin 81 MG tablet Take 81 mg by mouth daily.      . Calcium Carbonate-Vit D-Min (CALTRATE 600+D PLUS PO) Take 1 tablet by mouth 2 (two) times daily.       . ferrous sulfate 325 (65 FE) MG tablet Take 325 mg by mouth daily with breakfast.      . omeprazole (PRILOSEC) 20 MG capsule Take 20 mg by  mouth daily.      Marland Kitchen OVER THE COUNTER MEDICATION Take 1 tablet by mouth daily. Vitafushion v b12 500mg       . OVER THE COUNTER MEDICATION Take 1 tablet by mouth daily. Vitafushion omega -3  daily      . simvastatin (ZOCOR) 20 MG tablet Take 20 mg by mouth at bedtime.      . valsartan-hydrochlorothiazide (DIOVAN-HCT) 320-25 MG per tablet Take 1 tablet by mouth daily.      . vitamin C (ASCORBIC ACID) 500 MG tablet Take 500 mg by mouth daily.        Physical Exam: Blood pressure 146/57, pulse 74, temperature 98.8 F (37.1 C), temperature source Oral, resp. rate 20, height 5' 5.5" (1.664 m), weight 163 lb (73.936 kg), SpO2 100.00%.    General: Well developed, well nourished, NAD  HEENT: OP clear, mucus membranes moist  SKIN: warm, dry. No rashes.  Neuro: No focal deficits  Musculoskeletal: Muscle strength 5/5 all ext  Psychiatric: Mood and affect normal  Neck: No JVD, no carotid bruits, no thyromegaly, no lymphadenopathy.  Lungs:Clear bilaterally, no wheezes, rhonci, crackles  Cardiovascular: Regular rate and rhythm. No murmurs, gallops or rubs.  Abdomen:Soft. Bowel sounds present. Non-tender.  Extremities: No lower extremity edema. Pulses are 2 + in the bilateral DP/PT.  Labs:   Lab Results  Component Value Date   WBC 2.6* 07/29/2014   HGB 12.1 07/29/2014   HCT 35.8* 07/29/2014   MCV 78.9 07/29/2014   PLT 207 07/29/2014    Recent Labs Lab 07/29/14 1059  NA 146  K 3.6*  CL 103  CO2 29  BUN 11  CREATININE 0.72  CALCIUM 10.1  GLUCOSE 102*     ASSESSMENT AND PLAN:   1. Unstable angina/abnormal stress test: Plan cardiac cath today to exclude CAD.   [name], MD 07/29/2014, 12:23 PM

## 2014-07-29 NOTE — Interval H&P Note (Signed)
History and Physical Interval Note:  07/29/2014 12:28 PM  Emily Jenkins  has presented today for cardiac cath with the diagnosis of abnormal stress test, chest pain.  The various methods of treatment have been discussed with the patient and family. After consideration of risks, benefits and other options for treatment, the patient has consented to  Procedure(s): LEFT HEART CATHETERIZATION WITH CORONARY ANGIOGRAM (N/A) as a surgical intervention .  The patient's history has been reviewed, patient examined, no change in status, stable for surgery.  I have reviewed the patient's chart and labs.  Questions were answered to the patient's satisfaction.    Cath Lab Visit (complete for each Cath Lab visit)  Clinical Evaluation Leading to the Procedure:   ACS: No.  Non-ACS:    Anginal Classification: CCS II  Anti-ischemic medical therapy: Minimal Therapy (1 class of medications)  Non-Invasive Test Results: Intermediate-risk stress test findings: cardiac mortality 1-3%/year  Prior CABG: No previous CABG        MCALHANY,CHRISTOPHER

## 2014-08-13 ENCOUNTER — Encounter: Payer: Self-pay | Admitting: Internal Medicine

## 2014-08-13 ENCOUNTER — Ambulatory Visit (INDEPENDENT_AMBULATORY_CARE_PROVIDER_SITE_OTHER): Payer: Medicare HMO | Admitting: Internal Medicine

## 2014-08-13 VITALS — BP 128/72 | HR 76 | Temp 98.2°F | Resp 16 | Ht 66.0 in | Wt 166.8 lb

## 2014-08-13 DIAGNOSIS — I10 Essential (primary) hypertension: Secondary | ICD-10-CM

## 2014-08-13 DIAGNOSIS — E782 Mixed hyperlipidemia: Secondary | ICD-10-CM

## 2014-08-13 DIAGNOSIS — Z0000 Encounter for general adult medical examination without abnormal findings: Secondary | ICD-10-CM

## 2014-08-13 DIAGNOSIS — R7309 Other abnormal glucose: Secondary | ICD-10-CM

## 2014-08-13 DIAGNOSIS — Z1212 Encounter for screening for malignant neoplasm of rectum: Secondary | ICD-10-CM

## 2014-08-13 DIAGNOSIS — Z789 Other specified health status: Secondary | ICD-10-CM

## 2014-08-13 DIAGNOSIS — Z1331 Encounter for screening for depression: Secondary | ICD-10-CM

## 2014-08-13 DIAGNOSIS — Z79899 Other long term (current) drug therapy: Secondary | ICD-10-CM

## 2014-08-13 DIAGNOSIS — E559 Vitamin D deficiency, unspecified: Secondary | ICD-10-CM

## 2014-08-13 LAB — CBC WITH DIFFERENTIAL/PLATELET
Basophils Absolute: 0 10*3/uL (ref 0.0–0.1)
Basophils Relative: 1 % (ref 0–1)
Eosinophils Absolute: 0.1 10*3/uL (ref 0.0–0.7)
Eosinophils Relative: 2 % (ref 0–5)
HCT: 36.9 % (ref 36.0–46.0)
Hemoglobin: 12.4 g/dL (ref 12.0–15.0)
LYMPHS ABS: 1 10*3/uL (ref 0.7–4.0)
Lymphocytes Relative: 26 % (ref 12–46)
MCH: 26.3 pg (ref 26.0–34.0)
MCHC: 33.6 g/dL (ref 30.0–36.0)
MCV: 78.2 fL (ref 78.0–100.0)
Monocytes Absolute: 0.3 10*3/uL (ref 0.1–1.0)
Monocytes Relative: 8 % (ref 3–12)
NEUTROS ABS: 2.3 10*3/uL (ref 1.7–7.7)
NEUTROS PCT: 63 % (ref 43–77)
PLATELETS: 257 10*3/uL (ref 150–400)
RBC: 4.72 MIL/uL (ref 3.87–5.11)
RDW: 15.3 % (ref 11.5–15.5)
WBC: 3.7 10*3/uL — AB (ref 4.0–10.5)

## 2014-08-13 LAB — HEMOGLOBIN A1C
Hgb A1c MFr Bld: 6.3 % — ABNORMAL HIGH (ref <5.7)
Mean Plasma Glucose: 134 mg/dL — ABNORMAL HIGH (ref <117)

## 2014-08-13 MED ORDER — BISOPROLOL-HYDROCHLOROTHIAZIDE 5-6.25 MG PO TABS: 1.0000 | ORAL_TABLET | Freq: Every day | ORAL | Status: DC

## 2014-08-13 NOTE — Progress Notes (Signed)
Patient ID: Emily Jenkins, female   DOB: 1946-02-18, 68 y.o.   MRN: [id number]  Annual Screening Comprehensive Examination  This very nice 68 y.o.MBF presents for complete physical.  Patient has been followed for HTN, Prediabetes, Hyperlipidemia, and Vitamin D Deficiency.    HTN predates since 2006. Patient's BP has been controlled at home and patient denies any cardiac symptoms as chest pain, palpitations, shortness of breath, dizziness or ankle swelling. Patient had a Heart Cath 3 weeks ago which was normal.  Today's BP: 128/72 mmHg   Patient's hyperlipidemia is controlled with diet and medications. Patient denies myalgias or other medication SE's. Last lipids were at goal - Chol 159; HDL 67; LDL  78; Trig 70 on 04/14/2014:   Patient has prediabetes predating since May 2013 with A1c 5.9% and A1c was 6.1% in Aug 2014. Patient is managing with diet . Patient denies reactive hypoglycemic symptoms, visual blurring, diabetic polys, or paresthesias. Last A1c was  6.1% on 04/14/2014.   Finally, patient has history of Vitamin D Deficiency of 41 in 2012 and last Vitamin D was  52 on 04/14/2014.   Medication Sig  . aspirin 81 MG tab Take 81 mg by mouth daily.  . ferrous sulfate 325 MG  Take 325 mg by mouth daily with breakfast.  . Omeprazole 20 MG cap Take 20 mg by mouth daily.  . OTC  MEDICATION Take 1 tablet by mouth daily. Vitafushion v b12 500mg   . OTC MEDICATION Take 1 tablet by mouth daily. Vitafushion omega -3  daily  . simvastatin  20 MG tablet Take 20 mg by mouth at bedtime.  . valsartan-hctz 320-25 MG Take 1 tablet by mouth daily.  . vitamin C  500 MG  Take 500 mg by mouth daily.   Allergies  Allergen Reactions  . Fosamax [Alendronate Sodium] Itching  . Sulfa Antibiotics Itching   Past Medical History  Diagnosis Date  . Hypertension   . Hyperlipidemia   . Pre-diabetes   . GERD (gastroesophageal reflux disease)   . RLS (restless legs syndrome)   . DJD (degenerative joint disease)     Past Surgical History  Procedure Laterality Date  . Cholecystectomy    . Tubal ligation    . Ankle surgery     Family History  Problem Relation Age of Onset  . Heart disease Mother 62    stent  . Hypertension Mother   . Cancer Father   . Diabetes Father   . Hyperlipidemia Sister   . Hypertension Sister    History  Substance Use Topics  . Smoking status: Never Smoker   . Smokeless tobacco: Not on file  . Alcohol Use: No    ROS Constitutional: Denies fever, chills, weight loss/gain, headaches, insomnia, fatigue, night sweats, and change in appetite. Eyes: Denies redness, blurred vision, diplopia, discharge, itchy, watery eyes.  ENT: Denies discharge, congestion, post nasal drip, epistaxis, sore throat, earache, hearing loss, dental pain, Tinnitus, Vertigo, Sinus pain, snoring.  Cardio: Denies chest pain, palpitations, irregular heartbeat, syncope, dyspnea, diaphoresis, orthopnea, PND, claudication, edema Respiratory: denies cough, dyspnea, DOE, pleurisy, hoarseness, laryngitis, wheezing.  Gastrointestinal: Denies dysphagia, heartburn, reflux, water brash, pain, cramps, nausea, vomiting, bloating, diarrhea, constipation, hematemesis, melena, hematochezia, jaundice, hemorrhoids Genitourinary: Denies dysuria, frequency, urgency, nocturia, hesitancy, discharge, hematuria, flank pain Breast: Breast lumps, nipple discharge, bleeding.  Musculoskeletal: Denies arthralgia, myalgia, stiffness, Jt. Swelling, pain, limp, and strain/sprain. Denies falls. Skin: Denies puritis, rash, hives, warts, acne, eczema, changing in skin lesion Neuro: No weakness, tremor,  incoordination, spasms, paresthesia, pain Psychiatric: Denies confusion, memory loss, sensory loss. Denies Depression. Endocrine: Denies change in weight, skin, hair change, nocturia, and paresthesia, diabetic polys, visual blurring, hyper / hypo glycemic episodes.  Heme/Lymph: No excessive bleeding, bruising, enlarged lymph  nodes.  Physical Exam  BP 128/72  Pulse 76  Temp 98.2 F   Resp 16  Ht 5\' 6"    Wt 166 lb 12.8 oz   BMI 26.94  General Appearance: Well nourished and in no apparent distress. Eyes: PERRLA, EOMs, conjunctiva no swelling or erythema, normal fundi and vessels. Sinuses: No frontal/maxillary tenderness ENT/Mouth: EACs patent / TMs  nl. Nares clear without erythema, swelling, mucoid exudates. Oral hygiene is good. No erythema, swelling, or exudate. Tongue normal, non-obstructing. Tonsils not swollen or erythematous. Hearing normal.  Neck: Supple, thyroid normal. No bruits, nodes or JVD. Respiratory: Respiratory effort normal.  BS equal and clear bilateral without rales, rhonci, wheezing or stridor. Cardio: Heart sounds are normal with regular rate and rhythm and no murmurs, rubs or gallops. Peripheral pulses are normal and equal bilaterally without edema. No aortic or femoral bruits. Chest: symmetric with normal excursions and percussion. Breasts: Symmetric, without lumps, nipple discharge, retractions, or fibrocystic changes.  Abdomen: Flat, soft, with bowl sounds. Nontender, no guarding, rebound, hernias, masses, or organomegaly.  Lymphatics: Non tender without lymphadenopathy.  Musculoskeletal: Full ROM all peripheral extremities, joint stability, 5/5 strength, and normal gait. Skin: Warm and dry without rashes, lesions, cyanosis, clubbing or  ecchymosis.  Neuro: Cranial nerves intact, reflexes equal bilaterally. Normal muscle tone, no cerebellar symptoms. Sensation intact.  Pysch: Awake and oriented X 3, normal affect, Insight and Judgment appropriate.   Assessment and Plan  1. Annual Screening Examination 2. Hypertension  3. Hyperlipidemia 4. Pre Diabetes 5. Vitamin D Deficiency  Continue prudent diet as discussed, weight control, BP monitoring, regular exercise, and medications. Discussed med's effects and SE's. Screening labs and tests as requested with regular follow-up as  recommended.

## 2014-08-13 NOTE — Patient Instructions (Addendum)
Start New BP Pill ZIAC ( Bisoprolol) And Cut Diovan (Valsartin) in 1/2  ++++++++++++++++++++++++++++++++++++++++++++++++++++++++++++  Recommend the book "The END of DIETING" by Dr Baker Janus   and the book "The END of DIABETES " by Dr Excell Seltzer  At [email or web address] - get book & Audio CD's      Being diabetic has a  300% increased risk for heart attack, stroke, cancer, and alzheimer- type vascular dementia. It is very important that you work harder with diet by avoiding all foods that are white except chicken & fish. Avoid white rice (brown & wild rice is OK), white potatoes (sweetpotatoes in moderation is OK), White bread or wheat bread or anything made out of white flour like bagels, donuts, rolls, buns, biscuits, cakes, pastries, cookies, pizza crust, and pasta (made from white flour & egg whites) - vegetarian pasta or spinach or wheat pasta is OK. Multigrain breads like Arnold's or [employer], or multigrain sandwich thins or flatbreads.  Diet, exercise and weight loss can reverse and cure diabetes in the early stages.  Diet, exercise and weight loss is very important in the control and prevention of complications of diabetes which affects every system in your body, ie. Brain - dementia/stroke, eyes - glaucoma/blindness, heart - heart attack/heart failure, kidneys - dialysis, stomach - gastric paralysis, intestines - malabsorption, nerves - severe painful neuritis, circulation - gangrene & loss of a leg(s), and finally cancer and Alzheimers.    I recommend avoid fried & greasy foods,  sweets/candy, white rice (brown or wild rice or Quinoa is OK), white potatoes (sweet potatoes are OK) - anything made from white flour - bagels, doughnuts, rolls, buns, biscuits,white and wheat breads, pizza crust and traditional pasta made of white flour & egg white(vegetarian pasta or spinach or wheat pasta is OK).  Multi-grain bread is OK - like multi-grain flat bread or sandwich thins. Avoid alcohol in excess.  Exercise is also important.    Eat all the vegetables you want - avoid meat, especially red meat and dairy - especially cheese.  Cheese is the most concentrated form of trans-fats which is the worst thing to clog up our arteries. Veggie cheese is OK which can be found in the fresh produce section at [employer] or [employer] or [employer]   Preventive Care for Adults A healthy lifestyle and preventive care can promote health and wellness. Preventive health guidelines for women include the following key practices.  A routine yearly physical is a good way to check with your health care provider about your health and preventive screening. It is a chance to share any concerns and updates on your health and to receive a thorough exam.  Visit your dentist for a routine exam and preventive care every 6 months. Brush your teeth twice a day and floss once a day. Good oral hygiene prevents tooth decay and gum disease.  The frequency of eye exams is based on your age, health, family medical history, use of contact lenses, and other factors. Follow your health care provider's recommendations for frequency of eye exams.  Eat a healthy diet. Foods like vegetables, fruits, whole grains, low-fat dairy products, and lean protein foods contain the nutrients you need without too many calories. Decrease your intake of foods high in solid fats, added sugars, and salt. Eat the right amount of calories for you.Get information about a proper diet from your health care provider, if necessary.  Regular physical exercise is one of the most important things you can do for your  health. Most adults should get at least 150 minutes of moderate-intensity exercise (any activity that increases your heart rate and causes you to sweat) each week. In addition, most adults need muscle-strengthening exercises on 2 or more days a week.  Maintain a healthy weight. The body mass index (BMI) is a screening tool to identify possible weight  problems. It provides an estimate of body fat based on height and weight. Your health care provider can find your BMI and can help you achieve or maintain a healthy weight.For adults 20 years and older:  A BMI below 18.5 is considered underweight.  A BMI of 18.5 to 24.9 is normal.  A BMI of 25 to 29.9 is considered overweight.  A BMI of 30 and above is considered obese.  Maintain normal blood lipids and cholesterol levels by exercising and minimizing your intake of saturated fat. Eat a balanced diet with plenty of fruit and vegetables. Blood tests for lipids and cholesterol should begin at age 42 and be repeated every 5 years. If your lipid or cholesterol levels are high, you are over 50, or you are at high risk for heart disease, you may need your cholesterol levels checked more frequently.Ongoing high lipid and cholesterol levels should be treated with medicines if diet and exercise are not working.  If you smoke, find out from your health care provider how to quit. If you do not use tobacco, do not start.  Lung cancer screening is recommended for adults aged 50-80 years who are at high risk for developing lung cancer because of a history of smoking. A yearly low-dose CT scan of the lungs is recommended for people who have at least a 30-pack-year history of smoking and are a current smoker or have quit within the past 15 years. A pack year of smoking is smoking an average of 1 pack of cigarettes a day for 1 year (for example: 1 pack a day for 30 years or 2 packs a day for 15 years). Yearly screening should continue until the smoker has stopped smoking for at least 15 years. Yearly screening should be stopped for people who develop a health problem that would prevent them from having lung cancer treatment.  If you are pregnant, do not drink alcohol. If you are breastfeeding, be very cautious about drinking alcohol. If you are not pregnant and choose to drink alcohol, do not have more than 1 drink  per day. One drink is considered to be 12 ounces (355 mL) of beer, 5 ounces (148 mL) of wine, or 1.5 ounces (44 mL) of liquor.  Avoid use of street drugs. Do not share needles with anyone. Ask for help if you need support or instructions about stopping the use of drugs.  High blood pressure causes heart disease and increases the risk of stroke. Your blood pressure should be checked at least every 1 to 2 years. Ongoing high blood pressure should be treated with medicines if weight loss and exercise do not work.  If you are 83-7 years old, ask your health care provider if you should take aspirin to prevent strokes.  Diabetes screening involves taking a blood sample to check your fasting blood sugar level. This should be done once every 3 years, after age 65, if you are within normal weight and without risk factors for diabetes. Testing should be considered at a younger age or be carried out more frequently if you are overweight and have at least 1 risk factor for diabetes.  Breast cancer  screening is essential preventive care for women. You should practice "breast self-awareness." This means understanding the normal appearance and feel of your breasts and may include breast self-examination. Any changes detected, no matter how small, should be reported to a health care provider. Women in their 100s and 30s should have a clinical breast exam (CBE) by a health care provider as part of a regular health exam every 1 to 3 years. After age 34, women should have a CBE every year. Starting at age 70, women should consider having a mammogram (breast X-ray test) every year. Women who have a family history of breast cancer should talk to their health care provider about genetic screening. Women at a high risk of breast cancer should talk to their health care providers about having an MRI and a mammogram every year.  Breast cancer gene (BRCA)-related cancer risk assessment is recommended for women who have family  members with BRCA-related cancers. BRCA-related cancers include breast, ovarian, tubal, and peritoneal cancers. Having family members with these cancers may be associated with an increased risk for harmful changes (mutations) in the breast cancer genes BRCA1 and BRCA2. Results of the assessment will determine the need for genetic counseling and BRCA1 and BRCA2 testing.  Routine pelvic exams to screen for cancer are no longer recommended for nonpregnant women who are considered low risk for cancer of the pelvic organs (ovaries, uterus, and vagina) and who do not have symptoms. Ask your health care provider if a screening pelvic exam is right for you.  If you have had past treatment for cervical cancer or a condition that could lead to cancer, you need Pap tests and screening for cancer for at least 20 years after your treatment. If Pap tests have been discontinued, your risk factors (such as having a new sexual partner) need to be reassessed to determine if screening should be resumed. Some women have medical problems that increase the chance of getting cervical cancer. In these cases, your health care provider may recommend more frequent screening and Pap tests.  The HPV test is an additional test that may be used for cervical cancer screening. The HPV test looks for the virus that can cause the cell changes on the cervix. The cells collected during the Pap test can be tested for HPV. The HPV test could be used to screen women aged 61 years and older, and should be used in women of any age who have unclear Pap test results. After the age of 53, women should have HPV testing at the same frequency as a Pap test.  Colorectal cancer can be detected and often prevented. Most routine colorectal cancer screening begins at the age of 4 years and continues through age 40 years. However, your health care provider may recommend screening at an earlier age if you have risk factors for colon cancer. On a yearly basis,  your health care provider may provide home test kits to check for hidden blood in the stool. Use of a small camera at the end of a tube, to directly examine the colon (sigmoidoscopy or colonoscopy), can detect the earliest forms of colorectal cancer. Talk to your health care provider about this at age 61, when routine screening begins. Direct exam of the colon should be repeated every 5-10 years through age 65 years, unless early forms of pre-cancerous polyps or small growths are found.  People who are at an increased risk for hepatitis B should be screened for this virus. You are considered at high  risk for hepatitis B if:  You were born in a country where hepatitis B occurs often. Talk with your health care provider about which countries are considered high risk.  Your parents were born in a high-risk country and you have not received a shot to protect against hepatitis B (hepatitis B vaccine).  You have HIV or AIDS.  You use needles to inject street drugs.  You live with, or have sex with, someone who has hepatitis B.  You get hemodialysis treatment.  You take certain medicines for conditions like cancer, organ transplantation, and autoimmune conditions.  Hepatitis C blood testing is recommended for all people born from [birth year] through 1966 and any individual with known risks for hepatitis C.  Practice safe sex. Use condoms and avoid high-risk sexual practices to reduce the spread of sexually transmitted infections (STIs). STIs include gonorrhea, chlamydia, syphilis, trichomonas, herpes, HPV, and human immunodeficiency virus (HIV). Herpes, HIV, and HPV are viral illnesses that have no cure. They can result in disability, cancer, and death.  You should be screened for sexually transmitted illnesses (STIs) including gonorrhea and chlamydia if:  You are sexually active and are younger than 24 years.  You are older than 24 years and your health care provider tells you that you are at risk for  this type of infection.  Your sexual activity has changed since you were last screened and you are at an increased risk for chlamydia or gonorrhea. Ask your health care provider if you are at risk.  If you are at risk of being infected with HIV, it is recommended that you take a prescription medicine daily to prevent HIV infection. This is called preexposure prophylaxis (PrEP). You are considered at risk if:  You are a heterosexual woman, are sexually active, and are at increased risk for HIV infection.  You take drugs by injection.  You are sexually active with a partner who has HIV.  Talk with your health care provider about whether you are at high risk of being infected with HIV. If you choose to begin PrEP, you should first be tested for HIV. You should then be tested every 3 months for as long as you are taking PrEP.  Osteoporosis is a disease in which the bones lose minerals and strength with aging. This can result in serious bone fractures or breaks. The risk of osteoporosis can be identified using a bone density scan. Women ages 67 years and over and women at risk for fractures or osteoporosis should discuss screening with their health care providers. Ask your health care provider whether you should take a calcium supplement or vitamin D to reduce the rate of osteoporosis.  Menopause can be associated with physical symptoms and risks. Hormone replacement therapy is available to decrease symptoms and risks. You should talk to your health care provider about whether hormone replacement therapy is right for you.  Use sunscreen. Apply sunscreen liberally and repeatedly throughout the day. You should seek shade when your shadow is shorter than you. Protect yourself by wearing long sleeves, pants, a wide-brimmed hat, and sunglasses year round, whenever you are outdoors.  Once a month, do a whole body skin exam, using a mirror to look at the skin on your back. Tell your health care provider of  new moles, moles that have irregular borders, moles that are larger than a pencil eraser, or moles that have changed in shape or color.  Stay current with required vaccines (immunizations).  Influenza vaccine. All adults should be  immunized every year.  Tetanus, diphtheria, and acellular pertussis (Td, Tdap) vaccine. Pregnant women should receive 1 dose of Tdap vaccine during each pregnancy. The dose should be obtained regardless of the length of time since the last dose. Immunization is preferred during the 27th-36th week of gestation. An adult who has not previously received Tdap or who does not know her vaccine status should receive 1 dose of Tdap. This initial dose should be followed by tetanus and diphtheria toxoids (Td) booster doses every 10 years. Adults with an unknown or incomplete history of completing a 3-dose immunization series with Td-containing vaccines should begin or complete a primary immunization series including a Tdap dose. Adults should receive a Td booster every 10 years.  Varicella vaccine. An adult without evidence of immunity to varicella should receive 2 doses or a second dose if she has previously received 1 dose. Pregnant females who do not have evidence of immunity should receive the first dose after pregnancy. This first dose should be obtained before leaving the health care facility. The second dose should be obtained 4-8 weeks after the first dose.  Human papillomavirus (HPV) vaccine. Females aged 13-26 years who have not received the vaccine previously should obtain the 3-dose series. The vaccine is not recommended for use in pregnant females. However, pregnancy testing is not needed before receiving a dose. If a female is found to be pregnant after receiving a dose, no treatment is needed. In that case, the remaining doses should be delayed until after the pregnancy. Immunization is recommended for any person with an immunocompromised condition through the age of 56  years if she did not get any or all doses earlier. During the 3-dose series, the second dose should be obtained 4-8 weeks after the first dose. The third dose should be obtained 24 weeks after the first dose and 16 weeks after the second dose.  Zoster vaccine. One dose is recommended for adults aged 78 years or older unless certain conditions are present.  Measles, mumps, and rubella (MMR) vaccine. Adults born before [birth year] generally are considered immune to measles and mumps. Adults born in [birth year] or later should have 1 or more doses of MMR vaccine unless there is a contraindication to the vaccine or there is laboratory evidence of immunity to each of the three diseases. A routine second dose of MMR vaccine should be obtained at least 28 days after the first dose for students attending postsecondary schools, health care workers, or international travelers. People who received inactivated measles vaccine or an unknown type of measles vaccine during 1963-1967 should receive 2 doses of MMR vaccine. People who received inactivated mumps vaccine or an unknown type of mumps vaccine before 1979 and are at high risk for mumps infection should consider immunization with 2 doses of MMR vaccine. For females of childbearing age, rubella immunity should be determined. If there is no evidence of immunity, females who are not pregnant should be vaccinated. If there is no evidence of immunity, females who are pregnant should delay immunization until after pregnancy. Unvaccinated health care workers born before [birth year] who lack laboratory evidence of measles, mumps, or rubella immunity or laboratory confirmation of disease should consider measles and mumps immunization with 2 doses of MMR vaccine or rubella immunization with 1 dose of MMR vaccine.  Pneumococcal 13-valent conjugate (PCV13) vaccine. When indicated, a person who is uncertain of her immunization history and has no record of immunization should receive the PCV13 vaccine.  An adult aged 2 years or older  who has certain medical conditions and has not been previously immunized should receive 1 dose of PCV13 vaccine. This PCV13 should be followed with a dose of pneumococcal polysaccharide (PPSV23) vaccine. The PPSV23 vaccine dose should be obtained at least 8 weeks after the dose of PCV13 vaccine. An adult aged 54 years or older who has certain medical conditions and previously received 1 or more doses of PPSV23 vaccine should receive 1 dose of PCV13. The PCV13 vaccine dose should be obtained 1 or more years after the last PPSV23 vaccine dose.  Pneumococcal polysaccharide (PPSV23) vaccine. When PCV13 is also indicated, PCV13 should be obtained first. All adults aged 74 years and older should be immunized. An adult younger than age 16 years who has certain medical conditions should be immunized. Any person who resides in a nursing home or long-term care facility should be immunized. An adult smoker should be immunized. People with an immunocompromised condition and certain other conditions should receive both PCV13 and PPSV23 vaccines. People with human immunodeficiency virus (HIV) infection should be immunized as soon as possible after diagnosis. Immunization during chemotherapy or radiation therapy should be avoided. Routine use of PPSV23 vaccine is not recommended for American Indians, [place] Natives, or people younger than 65 years unless there are medical conditions that require PPSV23 vaccine. When indicated, people who have unknown immunization and have no record of immunization should receive PPSV23 vaccine. One-time revaccination 5 years after the first dose of PPSV23 is recommended for people aged 19-64 years who have chronic kidney failure, nephrotic syndrome, asplenia, or immunocompromised conditions. People who received 1-2 doses of PPSV23 before age 86 years should receive another dose of PPSV23 vaccine at age 46 years or later if at least 5 years have passed since the  previous dose. Doses of PPSV23 are not needed for people immunized with PPSV23 at or after age 25 years.  Meningococcal vaccine. Adults with asplenia or persistent complement component deficiencies should receive 2 doses of quadrivalent meningococcal conjugate (MenACWY-D) vaccine. The doses should be obtained at least 2 months apart. Microbiologists working with certain meningococcal bacteria, [employer] recruits, people at risk during an outbreak, and people who travel to or live in countries with a high rate of meningitis should be immunized. A first-year college student up through age 2 years who is living in a residence hall should receive a dose if she did not receive a dose on or after her 16th birthday. Adults who have certain high-risk conditions should receive one or more doses of vaccine.  Hepatitis A vaccine. Adults who wish to be protected from this disease, have certain high-risk conditions, work with hepatitis A-infected animals, work in hepatitis A research labs, or travel to or work in countries with a high rate of hepatitis A should be immunized. Adults who were previously unvaccinated and who anticipate close contact with an international adoptee during the first 60 days after arrival in the [place] from a country with a high rate of hepatitis A should be immunized.  Hepatitis B vaccine. Adults who wish to be protected from this disease, have certain high-risk conditions, may be exposed to blood or other infectious body fluids, are household contacts or sex partners of hepatitis B positive people, are clients or workers in certain care facilities, or travel to or work in countries with a high rate of hepatitis B should be immunized.  Haemophilus influenzae type b (Hib) vaccine. A previously unvaccinated person with asplenia or sickle cell disease or having a scheduled splenectomy should  receive 1 dose of Hib vaccine. Regardless of previous immunization, a recipient of a hematopoietic  stem cell transplant should receive a 3-dose series 6-12 months after her successful transplant. Hib vaccine is not recommended for adults with HIV infection. Preventive Services / Frequency  Ages 19 years and over  Blood pressure check.** / Every 1 to 2 years.  Lipid and cholesterol check.** / Every 5 years beginning at age 44 years.  Lung cancer screening. / Every year if you are aged 62-80 years and have a 30-pack-year history of smoking and currently smoke or have quit within the past 15 years. Yearly screening is stopped once you have quit smoking for at least 15 years or develop a health problem that would prevent you from having lung cancer treatment.  Clinical breast exam.** / Every year after age 60 years.  BRCA-related cancer risk assessment.** / For women who have family members with a BRCA-related cancer (breast, ovarian, tubal, or peritoneal cancers).  Mammogram.** / Every year beginning at age 56 years and continuing for as long as you are in good health. Consult with your health care provider.  Pap test.** / Every 3 years starting at age 16 years through age 57 or 51 years with 3 consecutive normal Pap tests. Testing can be stopped between 65 and 70 years with 3 consecutive normal Pap tests and no abnormal Pap or HPV tests in the past 10 years.  HPV screening.** / Every 3 years from ages 38 years through ages 48 or 47 years with a history of 3 consecutive normal Pap tests. Testing can be stopped between 65 and 70 years with 3 consecutive normal Pap tests and no abnormal Pap or HPV tests in the past 10 years.  Fecal occult blood test (FOBT) of stool. / Every year beginning at age 17 years and continuing until age 1 years. You may not need to do this test if you get a colonoscopy every 10 years.  Flexible sigmoidoscopy or colonoscopy.** / Every 5 years for a flexible sigmoidoscopy or every 10 years for a colonoscopy beginning at age 24 years and continuing until age 13  years.  Hepatitis C blood test.** / For all people born from [birth year] through 1966 and any individual with known risks for hepatitis C.  Osteoporosis screening.** / A one-time screening for women ages 6 years and over and women at risk for fractures or osteoporosis.  Skin self-exam. / Monthly.  Influenza vaccine. / Every year.  Tetanus, diphtheria, and acellular pertussis (Tdap/Td) vaccine.** / 1 dose of Td every 10 years.  Varicella vaccine.** / Consult your health care provider.  Zoster vaccine.** / 1 dose for adults aged 59 years or older.  Pneumococcal 13-valent conjugate (PCV13) vaccine.** / Consult your health care provider.  Pneumococcal polysaccharide (PPSV23) vaccine.** / 1 dose for all adults aged 41 years and older.  Meningococcal vaccine.** / Consult your health care provider.  Hepatitis A vaccine.** / Consult your health care provider.  Hepatitis B vaccine.** / Consult your health care provider.  Haemophilus influenzae type b (Hib) vaccine.** / Consult your health care provider.      Abdominal Aortic Aneurysm (AAA) screening in anyone over age 34 with Hypertension.

## 2014-08-14 LAB — BASIC METABOLIC PANEL WITH GFR
BUN: 15 mg/dL (ref 6–23)
CHLORIDE: 101 meq/L (ref 96–112)
CO2: 31 meq/L (ref 19–32)
Calcium: 10 mg/dL (ref 8.4–10.5)
Creat: 0.72 mg/dL (ref 0.50–1.10)
GFR, Est African American: >89 mL/min
GFR, Est Non African American: 86 mL/min
Glucose, Bld: 105 mg/dL — ABNORMAL HIGH (ref 70–99)
POTASSIUM: 3.9 meq/L (ref 3.5–5.3)
SODIUM: 140 meq/L (ref 135–145)

## 2014-08-14 LAB — MAGNESIUM: Magnesium: 1.9 mg/dL (ref 1.5–2.5)

## 2014-08-14 LAB — HEPATIC FUNCTION PANEL
ALK PHOS: 85 U/L (ref 39–117)
ALT: 8 U/L (ref 0–35)
AST: 16 U/L (ref 0–37)
Albumin: 4.5 g/dL (ref 3.5–5.2)
BILIRUBIN DIRECT: 0.1 mg/dL (ref 0.0–0.3)
BILIRUBIN INDIRECT: 0.3 mg/dL (ref 0.2–1.2)
BILIRUBIN TOTAL: 0.4 mg/dL (ref 0.2–1.2)
Total Protein: 7.1 g/dL (ref 6.0–8.3)

## 2014-08-14 LAB — LIPID PANEL
CHOL/HDL RATIO: 2.5 ratio
Cholesterol: 165 mg/dL (ref 0–200)
HDL: 67 mg/dL (ref >39)
LDL Cholesterol: 84 mg/dL (ref 0–99)
Triglycerides: 68 mg/dL (ref <150)
VLDL: 14 mg/dL (ref 0–40)

## 2014-08-14 LAB — URINALYSIS, MICROSCOPIC ONLY
BACTERIA UA: NONE SEEN
Casts: NONE SEEN
Crystals: NONE SEEN
Squamous Epithelial / LPF: NONE SEEN

## 2014-08-14 LAB — TSH: TSH: 1.406 u[IU]/mL (ref 0.350–4.500)

## 2014-08-14 LAB — INSULIN, FASTING: Insulin fasting, serum: 7.8 u[IU]/mL (ref 2.0–19.6)

## 2014-08-14 LAB — MICROALBUMIN / CREATININE URINE RATIO
Creatinine, Urine: 40.9 mg/dL
MICROALB UR: 0.5 mg/dL (ref 0.00–1.89)
Microalb Creat Ratio: 12.2 mg/g (ref 0.0–30.0)

## 2014-08-14 LAB — VITAMIN D 25 HYDROXY (VIT D DEFICIENCY, FRACTURES): VIT D 25 HYDROXY: 83 ng/mL (ref 30–89)

## 2014-08-15 NOTE — Addendum Note (Signed)
Addended by: Unk Pinto on: 08/15/2014 05:25 PM   Modules accepted: Level of Service

## 2014-08-18 ENCOUNTER — Other Ambulatory Visit: Payer: Self-pay | Admitting: Physician Assistant

## 2014-09-01 ENCOUNTER — Ambulatory Visit: Payer: Self-pay | Admitting: Internal Medicine

## 2014-09-08 ENCOUNTER — Other Ambulatory Visit: Payer: Self-pay | Admitting: Internal Medicine

## 2014-09-17 ENCOUNTER — Ambulatory Visit (INDEPENDENT_AMBULATORY_CARE_PROVIDER_SITE_OTHER): Payer: Medicare HMO | Admitting: Internal Medicine

## 2014-09-17 ENCOUNTER — Encounter: Payer: Self-pay | Admitting: Internal Medicine

## 2014-09-17 VITALS — BP 132/60 | HR 64 | Temp 97.7°F | Resp 16 | Ht 66.0 in | Wt 167.2 lb

## 2014-09-17 DIAGNOSIS — I10 Essential (primary) hypertension: Secondary | ICD-10-CM

## 2014-09-17 DIAGNOSIS — R002 Palpitations: Secondary | ICD-10-CM

## 2014-09-17 DIAGNOSIS — Z23 Encounter for immunization: Secondary | ICD-10-CM

## 2014-09-17 MED ORDER — ATENOLOL 100 MG PO TABS: ORAL_TABLET | ORAL | Status: DC

## 2014-09-17 NOTE — Patient Instructions (Signed)
  Stop ZIAC (Bisoprolol)  ++++++++++++++++++++++++++++++++++  Increase DIOVAN (Valsartin) back to 1 whole tablet   ++++++++++++++++++++++++++++++++++ New Rx  ATENOLOL 100 Mg - start  1 /2  Tablet  (=50 mg) daily   &  If after 1 week still having heart flutters (palpitations)   Then increase Atenolol to 1 whole tablet daily

## 2014-09-17 NOTE — Progress Notes (Signed)
   Subjective:    Patient ID: Emily Jenkins, female    DOB: [birth year], 68 y.o.   MRN: [id number]  HPI Patient returns in 1 mo f/u after adding Ziac-5 and cutting her Diovan/HCTZ in 1/2 for c/o heart flutters. Pt's BP's have been WNL but she reports some swelling of her feet/ankles . Also, reports having occas flutters. CV Sx's review neg. otherwise.    Medication List   aspirin 81 MG tablet  Take 81 mg by mouth daily.     atenolol 100 MG tablet  Commonly known as:  TENORMIN  Take 1/2 to 1 tablet daily as directed for heart palpitations & BP     CALTRATE 600+D PLUS PO  Take 1 tablet by mouth 2 (two) times daily.     ferrous sulfate 325 (65 FE) MG tablet  Take 325 mg by mouth daily with breakfast.     omeprazole 20 MG capsule  Commonly known as:  PRILOSEC  TAKE ONE CAPSULE BY MOUTH ONCE DAILY     OVER THE COUNTER MEDICATION  Take 1 tablet by mouth daily. Vitafushion v b12 500mg      OVER THE COUNTER MEDICATION  Take 1 tablet by mouth daily. Vitafushion omega -3  daily     simvastatin 20 MG tablet  Commonly known as:  ZOCOR  Take 20 mg by mouth at bedtime.     valsartan-hydrochlorothiazide 320-25 MG per tablet  Commonly known as:  DIOVAN-HCT  Take 1 tablet by mouth daily.     vitamin C 500 MG tablet  Commonly known as:  ASCORBIC ACID  Take 500 mg by mouth daily.     Allergies  Allergen Reactions  . Fosamax [Alendronate Sodium] Itching  . Sulfa Antibiotics Itching   Past Medical History  Diagnosis Date  . Hypertension   . Hyperlipidemia   . Pre-diabetes   . GERD (gastroesophageal reflux disease)   . RLS (restless legs syndrome)   . DJD (degenerative joint disease)    Review of Systems  Neg otherwise.   Objective:   Physical Exam BP 132/60  Pulse 64  Temp 97.7 F   Resp 16  Ht 5\' 6"   Wt 167 lb   BMI 27.00   HEENT - Eac's patent. TM's Nl. EOM's full. PERRLA. NasoOroPharynx clear. Neck - supple. Nl Thyroid. Carotids 2+ & No bruits, nodes, JVD Chest  - Clear equal BS w/o Rales, rhonchi, wheezes. Cor - Nl HS. RRR w/o sig MGR. PP 1(+). Trace 1(+) ankle edema. MS- FROM w/o deformities. Muscle power, tone and bulk Nl. Gait Nl. Neuro - No obvious Cr N abnormalities. Sensory, motor and Cerebellar functions appear Nl w/o focal abnormalities. Psyche - Mental status normal & appropriate.  No delusions, ideations or obvious mood abnormalities.  Assessment & Plan:   1. Essential hypertension  - D/C Ziac , increase DiovanHCT back to 1 whole pill and add Atenolol 100 - ROV 1 mo  2. Palpitations  3. Need for prophylactic vaccination and inoculation against influenza  - Flu vaccine HIGH DOSE PF (Fluzone Tri High dose)

## 2014-09-18 ENCOUNTER — Ambulatory Visit: Payer: Self-pay | Admitting: Internal Medicine

## 2014-10-12 ENCOUNTER — Other Ambulatory Visit: Payer: Self-pay | Admitting: Internal Medicine

## 2014-10-20 ENCOUNTER — Ambulatory Visit (INDEPENDENT_AMBULATORY_CARE_PROVIDER_SITE_OTHER): Payer: Medicare HMO | Admitting: Internal Medicine

## 2014-10-20 ENCOUNTER — Encounter: Payer: Self-pay | Admitting: Internal Medicine

## 2014-10-20 VITALS — BP 136/80 | HR 84 | Temp 98.1°F | Resp 16 | Ht 66.0 in | Wt 164.0 lb

## 2014-10-20 DIAGNOSIS — I10 Essential (primary) hypertension: Secondary | ICD-10-CM

## 2014-10-20 DIAGNOSIS — K219 Gastro-esophageal reflux disease without esophagitis: Secondary | ICD-10-CM

## 2014-10-20 MED ORDER — RANITIDINE HCL 300 MG PO TABS: ORAL_TABLET | ORAL | Status: DC

## 2014-10-20 NOTE — Progress Notes (Signed)
   Subjective:    Patient ID: Emily Jenkins, female    DOB: [birth year], 68 y.o.   MRN: [id number]  HPI  Patient returns for 1 month f/u BP check after med adjustment. BP was elevated at CPE in Sept and she had Ziac added with taper of Diovan- HCT. She re-presented a monrth later with c/o palpitations and ankle swelling. She was advised to d/c Ziac & given Rx for Atenolol which she did not fill or start, but she did increase her Diovan back to a whole tablet. She denies any recent palpitations. Also, she had recently been started on Omeprazole qam  and continues to have sone indigestion/heartburn after supper.  Medication Sig  . aspirin 81 MG tablet Take 81 mg by mouth daily.  . Calcium Carbonate-Vit D-Min (CALTRATE 600+D PLUS PO) Take 1 tablet by mouth 2 (two) times daily.   . ferrous sulfate 325 (65 FE) MG tablet Take 325 mg by mouth daily with breakfast.  . omeprazole (PRILOSEC) 20 MG capsule TAKE ONE CAPSULE BY MOUTH ONCE DAILY  . OVER THE COUNTER MEDICATION Take 1 tablet by mouth daily. Vitafushion v b12 500mg   . OVER THE COUNTER MEDICATION Take 1 tablet by mouth daily. Vitafushion omega -3  daily  . simvastatin (ZOCOR) 20 MG tablet TAKE ONE TABLET BY MOUTH AT BEDTIME  . valsartan-hydrochlorothiazide (DIOVAN-HCT) 320-25 MG per tablet Take 1 tablet by mouth daily.  . vitamin C (ASCORBIC ACID) 500 MG tablet Take 500 mg by mouth daily.  Marland Kitchen atenolol (TENORMIN) 100 MG tablet Take 1/2 to 1 tablet daily as directed for heart palpitations & BP  . simvastatin (ZOCOR) 20 MG tablet Take 20 mg by mouth at bedtime.   Allergies  Allergen Reactions  . Fosamax [Alendronate Sodium] Itching  . Sulfa Antibiotics Itching   Past Medical History  Diagnosis Date  . Hypertension   . Hyperlipidemia   . Pre-diabetes   . GERD (gastroesophageal reflux disease)   . RLS (restless legs syndrome)   . DJD (degenerative joint disease)    Past Surgical History  Procedure Laterality Date  . Cholecystectomy     . Tubal ligation    . Ankle surgery     Review of Systems  Non contributory to above    Objective:   Physical Exam  BP 136/80 mmHg  Pulse 84  Temp(Src) 98.1 F (36.7 C)  Resp 16  Ht 5\' 6"  (1.676 m)  Wt 164 lb (74.39 kg)  BMI 26.48 kg/m2 HEENT - Eac's patent. TM's Nl. EOM's full. PERRLA. NasoOroPharynx clear. Neck - supple. Nl Thyroid. Carotids 2+ & No bruits, nodes, JVD Chest - Clear equal BS w/o Rales, rhonchi, wheezes. Cor - Nl HS. RRR w/o sig MGR. PP 1(+). No edema. Abd - No palpable organomegaly, masses or tenderness. BS nl. MS- FROM w/o deformities. Muscle power, tone and bulk Nl. Gait Nl. Neuro - No obvious Cr N abnormalities. Sensory, motor and Cerebellar functions appear Nl w/o focal abnormalities. Psyche - Mental status normal & appropriate.   Assessment & Plan:   1. Essential hypertension  - continue current meds same and monitor/call if abn BP  2. Gastroesophageal reflux disease without esophagitis  - Continue Omeprazole 20 mg qam & add Rx Ranitidine 300 mg with evening meal  - ROV ~ 6-7 wks for quarterly OV.

## 2014-10-20 NOTE — Patient Instructions (Addendum)
Gastroesophageal Reflux Disease, Adult Gastroesophageal reflux disease (GERD) happens when acid from your stomach flows up into the esophagus. When acid comes in contact with the esophagus, the acid causes soreness (inflammation) in the esophagus. Over time, GERD may create small holes (ulcers) in the lining of the esophagus. CAUSES   Increased body weight. This puts pressure on the stomach, making acid rise from the stomach into the esophagus.  Smoking. This increases acid production in the stomach.  Drinking alcohol. This causes decreased pressure in the lower esophageal sphincter (valve or ring of muscle between the esophagus and stomach), allowing acid from the stomach into the esophagus.  Late evening meals and a full stomach. This increases pressure and acid production in the stomach.  A malformed lower esophageal sphincter. Sometimes, no cause is found. SYMPTOMS   Burning pain in the lower part of the mid-chest behind the breastbone and in the mid-stomach area. This may occur twice a week or more often.  Trouble swallowing.  Sore throat.  Dry cough.  Asthma-like symptoms including chest tightness, shortness of breath, or wheezing. DIAGNOSIS  Your caregiver may be able to diagnose GERD based on your symptoms. In some cases, X-rays and other tests may be done to check for complications or to check the condition of your stomach and esophagus. TREATMENT  Your caregiver may recommend over-the-counter or prescription medicines to help decrease acid production. Ask your caregiver before starting or adding any new medicines.  HOME CARE INSTRUCTIONS   Change the factors that you can control. Ask your caregiver for guidance concerning weight loss, quitting smoking, and alcohol consumption.  Avoid foods and drinks that make your symptoms worse, such as:  Caffeine or alcoholic drinks.  Chocolate.  Peppermint or mint flavorings.  Garlic and onions.  Spicy foods.  Citrus fruits,  such as oranges, lemons, or limes.  Tomato-based foods such as sauce, chili, salsa, and pizza.  Fried and fatty foods.  Avoid lying down for the 3 hours prior to your bedtime or prior to taking a nap.  Eat small, frequent meals instead of large meals.  Wear loose-fitting clothing. Do not wear anything tight around your waist that causes pressure on your stomach.  Raise the head of your bed 6 to 8 inches with wood blocks to help you sleep. Extra pillows will not help.  Only take over-the-counter or prescription medicines for pain, discomfort, or fever as directed by your caregiver.  Do not take aspirin, ibuprofen, or other nonsteroidal anti-inflammatory drugs (NSAIDs). SEEK IMMEDIATE MEDICAL CARE IF:   You have pain in your arms, neck, jaw, teeth, or back.  Your pain increases or changes in intensity or duration.  You develop nausea, vomiting, or sweating (diaphoresis).  You develop shortness of breath, or you faint.  Your vomit is green, yellow, black, or looks like coffee grounds or blood.  Your stool is red, bloody, or black. These symptoms could be signs of other problems, such as heart disease, gastric bleeding, or esophageal bleeding. MAKE SURE YOU:   Understand these instructions.  Will watch your condition.  Will get help right away if you are not doing well or get worse. Document Released: 09/06/2005 Document Revised: 02/19/2012 Document Reviewed: 06/16/2011 [facility] Patient Information 2015 [place], [place]. This information is not intended to replace advice given to you by your health care provider. Make sure you discuss any questions you have with your health care provider.   Food Choices for Gastroesophageal Reflux Disease When you have gastroesophageal reflux disease (GERD), the  foods you eat and your eating habits are very important. Choosing the right foods can help ease the discomfort of GERD. WHAT GENERAL GUIDELINES DO I NEED TO FOLLOW?  Choose fruits,  vegetables, whole grains, low-fat dairy products, and low-fat meat, fish, and poultry.  Limit fats such as oils, salad dressings, butter, nuts, and avocado.  Keep a food diary to identify foods that cause symptoms.  Avoid foods that cause reflux. These may be different for different people.  Eat frequent small meals instead of three large meals each day.  Eat your meals slowly, in a relaxed setting.  Limit fried foods.  Cook foods using methods other than frying.  Avoid drinking alcohol.  Avoid drinking large amounts of liquids with your meals.  Avoid bending over or lying down until 2-3 hours after eating. WHAT FOODS ARE NOT RECOMMENDED? The following are some foods and drinks that may worsen your symptoms: Vegetables Tomatoes. Tomato juice. Tomato and spaghetti sauce. Chili peppers. Onion and garlic. Horseradish. Fruits Oranges, grapefruit, and lemon (fruit and juice). Meats High-fat meats, fish, and poultry. This includes hot dogs, ribs, ham, sausage, salami, and bacon. Dairy Whole milk and chocolate milk. Sour cream. Cream. Butter. Ice cream. Cream cheese.  Beverages Coffee and tea, with or without caffeine. Carbonated beverages or energy drinks. Condiments Hot sauce. Barbecue sauce.  Sweets/Desserts Chocolate and cocoa. Donuts. Peppermint and spearmint. Fats and Oils High-fat foods, including [place] fries and potato chips. Other Vinegar. Strong spices, such as black pepper, white pepper, red pepper, cayenne, curry powder, cloves, ginger, and chili powder.

## 2014-11-07 ENCOUNTER — Encounter: Payer: Self-pay | Admitting: *Deleted

## 2014-11-19 ENCOUNTER — Encounter (HOSPITAL_COMMUNITY): Payer: Self-pay | Admitting: Cardiovascular Disease

## 2014-12-02 ENCOUNTER — Ambulatory Visit (INDEPENDENT_AMBULATORY_CARE_PROVIDER_SITE_OTHER): Payer: Medicare HMO | Admitting: Physician Assistant

## 2014-12-02 VITALS — BP 122/68 | HR 80 | Temp 97.9°F | Resp 16 | Ht 66.0 in | Wt 165.0 lb

## 2014-12-02 DIAGNOSIS — E782 Mixed hyperlipidemia: Secondary | ICD-10-CM

## 2014-12-02 DIAGNOSIS — I10 Essential (primary) hypertension: Secondary | ICD-10-CM

## 2014-12-02 DIAGNOSIS — H6593 Unspecified nonsuppurative otitis media, bilateral: Secondary | ICD-10-CM

## 2014-12-02 DIAGNOSIS — E559 Vitamin D deficiency, unspecified: Secondary | ICD-10-CM

## 2014-12-02 DIAGNOSIS — R7309 Other abnormal glucose: Secondary | ICD-10-CM

## 2014-12-02 DIAGNOSIS — Z79899 Other long term (current) drug therapy: Secondary | ICD-10-CM

## 2014-12-02 DIAGNOSIS — R7303 Prediabetes: Secondary | ICD-10-CM

## 2014-12-02 LAB — CBC WITH DIFFERENTIAL/PLATELET
Basophils Absolute: 0 10*3/uL (ref 0.0–0.1)
Basophils Relative: 1 % (ref 0–1)
EOS PCT: 2 % (ref 0–5)
Eosinophils Absolute: 0.1 10*3/uL (ref 0.0–0.7)
HCT: 34 % — ABNORMAL LOW (ref 36.0–46.0)
HEMOGLOBIN: 11.8 g/dL — AB (ref 12.0–15.0)
LYMPHS ABS: 1 10*3/uL (ref 0.7–4.0)
Lymphocytes Relative: 30 % (ref 12–46)
MCH: 26.5 pg (ref 26.0–34.0)
MCHC: 34.7 g/dL (ref 30.0–36.0)
MCV: 76.4 fL — AB (ref 78.0–100.0)
MONO ABS: 0.2 10*3/uL (ref 0.1–1.0)
MPV: 10.1 fL (ref 9.4–12.4)
Monocytes Relative: 6 % (ref 3–12)
Neutro Abs: 2.1 10*3/uL (ref 1.7–7.7)
Neutrophils Relative %: 61 % (ref 43–77)
Platelets: 251 10*3/uL (ref 150–400)
RBC: 4.45 MIL/uL (ref 3.87–5.11)
RDW: 15.1 % (ref 11.5–15.5)
WBC: 3.4 10*3/uL — ABNORMAL LOW (ref 4.0–10.5)

## 2014-12-02 LAB — BASIC METABOLIC PANEL WITH GFR
BUN: 9 mg/dL (ref 6–23)
CHLORIDE: 102 meq/L (ref 96–112)
CO2: 29 meq/L (ref 19–32)
CREATININE: 0.77 mg/dL (ref 0.50–1.10)
Calcium: 9.6 mg/dL (ref 8.4–10.5)
GFR, Est African American: >89 mL/min
GFR, Est Non African American: 79 mL/min
GLUCOSE: 94 mg/dL (ref 70–99)
Potassium: 3.9 mEq/L (ref 3.5–5.3)
Sodium: 142 mEq/L (ref 135–145)

## 2014-12-02 LAB — HEPATIC FUNCTION PANEL
ALK PHOS: 64 U/L (ref 39–117)
ALT: 11 U/L (ref 0–35)
AST: 20 U/L (ref 0–37)
Albumin: 4.1 g/dL (ref 3.5–5.2)
Bilirubin, Direct: 0.1 mg/dL (ref 0.0–0.3)
Indirect Bilirubin: 0.3 mg/dL (ref 0.2–1.2)
TOTAL PROTEIN: 6.5 g/dL (ref 6.0–8.3)
Total Bilirubin: 0.4 mg/dL (ref 0.2–1.2)

## 2014-12-02 LAB — LIPID PANEL
CHOL/HDL RATIO: 2.8 ratio
Cholesterol: 161 mg/dL (ref 0–200)
HDL: 57 mg/dL (ref >39)
LDL Cholesterol: 89 mg/dL (ref 0–99)
Triglycerides: 75 mg/dL (ref <150)
VLDL: 15 mg/dL (ref 0–40)

## 2014-12-02 LAB — MAGNESIUM: MAGNESIUM: 2 mg/dL (ref 1.5–2.5)

## 2014-12-02 NOTE — Patient Instructions (Signed)
Your ears and sinuses are connected by the eustachian tube. When your sinuses are inflamed, this can close off the tube and cause fluid to collect in your middle ear. This can then cause dizziness, popping, clicking, ringing, and echoing in your ears. This is often NOT an infection and does NOT require antibiotics, it is caused by inflammation so the treatments help the inflammation. This can take a long time to get better so please be patient.  Here are things you can do to help with this: - Try the Flonase or Nasonex. Remember to spray each nostril twice towards the outer part of your eye.  Do not sniff but instead pinch your nose and tilt your head back to help the medicine get into your sinuses.  The best time to do this is at bedtime.Stop if you get blurred vision or nose bleeds.  -While drinking fluids, pinch and hold nose close and swallow, to help open eustachian tubes to drain fluid behind ear drums. -Please pick one of the over the counter allergy medications below and take it once daily for allergies.  It will also help with fluid behind ear drums. Claritin or loratadine cheapest but likely the weakest  Zyrtec or certizine at night because it can make you sleepy The strongest is allegra or fexafinadine  Cheapest at [employer], [employer], [employer] -can use decongestant over the counter, please do not use if you have high blood pressure or certain heart conditions.   if worsening HA, changes vision/speech, imbalance, weakness go to the ER     Bad carbs also include fruit juice, alcohol, and sweet tea. These are empty calories that do not signal to your brain that you are full.   Please remember the good carbs are still carbs which convert into sugar. So please measure them out no more than 1/2-1 cup of rice, oatmeal, pasta, and beans  Veggies are however free foods! Pile them on.   Not all fruit is created equal. Please see the list below, the fruit at the bottom is higher in sugars than the  fruit at the top. Please avoid all dried fruits.     

## 2014-12-02 NOTE — Progress Notes (Signed)
Assessment and Plan:  Hypertension: Continue medication, monitor blood pressure at home. Continue DASH diet.  Reminder to go to the ER if any CP, SOB, nausea, dizziness, severe HA, changes vision/speech, left arm numbness and tingling, and jaw pain. Cholesterol: Continue diet and exercise. Check cholesterol.  Pre-diabetes-Continue diet and exercise. Check A1C Vitamin D Def- check level and continue medications.  Allergies/effusion ears-Continue OTC allergy pills  Continue diet and meds as discussed. Further disposition pending results of labs.  HPI 68 y.o. female  presents for 3 month follow up with hypertension, hyperlipidemia, prediabetes and vitamin D. Her blood pressure has been controlled at home, today their BP is BP: 122/68 mmHg She does not workout due to left knee pain but she is going to see Dr. Lorin Mercy.  She denies chest pain, shortness of breath, dizziness.  She is on cholesterol medication, zocor 20mg  QD and denies myalgias. Her cholesterol is at goal. The cholesterol last visit was:   Lab Results  Component Value Date   CHOL 165 08/13/2014   HDL 67 08/13/2014   LDLCALC 84 08/13/2014   TRIG 68 08/13/2014   CHOLHDL 2.5 08/13/2014   She has been working on diet and exercise for prediabetes, and denies paresthesia of the feet, polydipsia and polyuria. Last A1C in the office was:  Lab Results  Component Value Date   HGBA1C 6.3* 08/13/2014   Patient is on Vitamin D supplement.   Lab Results  Component Value Date   VD25OH 83 08/13/2014     BMI is Body mass index is 26.64 kg/(m^2)., she is working on diet and exercise. Wt Readings from Last 3 Encounters:  12/02/14 165 lb (74.844 kg)  10/20/14 164 lb (74.39 kg)  09/17/14 167 lb 3.2 oz (75.841 kg)    Current Medications:  Current Outpatient Prescriptions on File Prior to Visit  Medication Sig Dispense Refill  . aspirin 81 MG tablet Take 81 mg by mouth daily.    Marland Kitchen atenolol (TENORMIN) 100 MG tablet Take 1/2 to 1 tablet  daily as directed for heart palpitations & BP 90 tablet 99  . Calcium Carbonate-Vit D-Min (CALTRATE 600+D PLUS PO) Take 1 tablet by mouth 2 (two) times daily.     . ferrous sulfate 325 (65 FE) MG tablet Take 325 mg by mouth daily with breakfast.    . omeprazole (PRILOSEC) 20 MG capsule TAKE ONE CAPSULE BY MOUTH ONCE DAILY 90 capsule 0  . OVER THE COUNTER MEDICATION Take 1 tablet by mouth daily. Vitafushion v b12 500mg     . OVER THE COUNTER MEDICATION Take 1 tablet by mouth daily. Vitafushion omega -3  daily    . ranitidine (ZANTAC) 300 MG tablet Take 1 tablet daily with supper for indigestion and reflux 90 tablet 99  . simvastatin (ZOCOR) 20 MG tablet TAKE ONE TABLET BY MOUTH AT BEDTIME 90 tablet 0  . valsartan-hydrochlorothiazide (DIOVAN-HCT) 320-25 MG per tablet Take 1 tablet by mouth daily.    . vitamin C (ASCORBIC ACID) 500 MG tablet Take 500 mg by mouth daily.     No current facility-administered medications on file prior to visit.   Medical History:  Past Medical History  Diagnosis Date  . Hypertension   . Hyperlipidemia   . Pre-diabetes   . GERD (gastroesophageal reflux disease)   . RLS (restless legs syndrome)   . DJD (degenerative joint disease)    Allergies:  Allergies  Allergen Reactions  . Fosamax [Alendronate Sodium] Itching  . Sulfa Antibiotics Itching  Review of Systems:  Review of Systems  Constitutional: Negative.  Negative for fever and chills.  HENT: Positive for congestion, ear pain, sore throat and tinnitus. Negative for ear discharge, hearing loss and nosebleeds.   Eyes: Negative.   Respiratory: Negative.  Negative for stridor.   Cardiovascular: Negative.   Gastrointestinal: Positive for constipation. Negative for heartburn, nausea, vomiting, abdominal pain, diarrhea, blood in stool and melena.  Genitourinary: Negative.   Musculoskeletal: Positive for back pain and joint pain (left knee). Negative for myalgias, falls and neck pain.  Skin: Negative.    Neurological: Negative.  Negative for headaches.  Psychiatric/Behavioral: Negative.    Family history- Review and unchanged Social history- Review and unchanged Physical Exam: BP 122/68 mmHg  Pulse 80  Temp(Src) 97.9 F (36.6 C)  Resp 16  Ht 5\' 6"  (1.676 m)  Wt 165 lb (74.844 kg)  BMI 26.64 kg/m2 Wt Readings from Last 3 Encounters:  12/02/14 165 lb (74.844 kg)  10/20/14 164 lb (74.39 kg)  09/17/14 167 lb 3.2 oz (75.841 kg)   General Appearance: Well nourished, in no apparent distress. Eyes: PERRLA, EOMs, conjunctiva no swelling or erythema Sinuses: No Frontal/maxillary tenderness ENT/Mouth: Ext aud canals clear, TMs without erythema, + effusion No erythema, swelling, or exudate on post pharynx.  Tonsils not swollen or erythematous. Hearing normal.  Neck: Supple, thyroid normal.  Respiratory: Respiratory effort normal, BS equal bilaterally without rales, rhonchi, wheezing or stridor.  Cardio: RRR with no MRGs. Brisk peripheral pulses without edema.  Abdomen: Soft, + BS.  Non tender, no guarding, rebound, hernias, masses. Lymphatics: Non tender without lymphadenopathy.  Musculoskeletal: Full ROM, 5/5 strength, normal gait.  Skin: Warm, dry without rashes, lesions, ecchymosis.Left big toe with plantar wart on toe, good cap refill.   Neuro: Cranial nerves intact. Normal muscle tone, no cerebellar symptoms. Sensation intact.  Psych: Awake and oriented X 3, normal affect, Insight and Judgment appropriate.    Vicie Mutters, PA-C 2:06 PM [facility]

## 2014-12-02 NOTE — Addendum Note (Signed)
Addended by: Vicie Mutters R on: 12/02/2014 02:19 PM   Modules accepted: Orders

## 2014-12-03 LAB — VITAMIN D 25 HYDROXY (VIT D DEFICIENCY, FRACTURES): Vit D, 25-Hydroxy: 65 ng/mL (ref 30–100)

## 2014-12-03 LAB — HEMOGLOBIN A1C
HEMOGLOBIN A1C: 6 % — AB (ref <5.7)
Mean Plasma Glucose: 126 mg/dL — ABNORMAL HIGH (ref <117)

## 2014-12-03 LAB — TSH: TSH: 1.544 u[IU]/mL (ref 0.350–4.500)

## 2014-12-25 ENCOUNTER — Other Ambulatory Visit: Payer: Self-pay | Admitting: Internal Medicine

## 2015-01-29 ENCOUNTER — Other Ambulatory Visit: Payer: Self-pay | Admitting: Physician Assistant

## 2015-03-03 ENCOUNTER — Other Ambulatory Visit: Payer: Self-pay | Admitting: Internal Medicine

## 2015-03-04 ENCOUNTER — Encounter: Payer: Self-pay | Admitting: Internal Medicine

## 2015-03-04 ENCOUNTER — Ambulatory Visit (INDEPENDENT_AMBULATORY_CARE_PROVIDER_SITE_OTHER): Payer: Medicare PPO | Admitting: Internal Medicine

## 2015-03-04 VITALS — BP 122/70 | HR 80 | Temp 97.3°F | Resp 16 | Ht 66.0 in | Wt 164.0 lb

## 2015-03-04 DIAGNOSIS — K219 Gastro-esophageal reflux disease without esophagitis: Secondary | ICD-10-CM

## 2015-03-04 DIAGNOSIS — E782 Mixed hyperlipidemia: Secondary | ICD-10-CM

## 2015-03-04 DIAGNOSIS — R7303 Prediabetes: Secondary | ICD-10-CM

## 2015-03-04 DIAGNOSIS — R7309 Other abnormal glucose: Secondary | ICD-10-CM

## 2015-03-04 DIAGNOSIS — M25561 Pain in right knee: Secondary | ICD-10-CM

## 2015-03-04 DIAGNOSIS — I10 Essential (primary) hypertension: Secondary | ICD-10-CM

## 2015-03-04 DIAGNOSIS — E559 Vitamin D deficiency, unspecified: Secondary | ICD-10-CM

## 2015-03-04 DIAGNOSIS — Z79899 Other long term (current) drug therapy: Secondary | ICD-10-CM

## 2015-03-04 LAB — BASIC METABOLIC PANEL WITH GFR
BUN: 15 mg/dL (ref 6–23)
CO2: 33 mEq/L — ABNORMAL HIGH (ref 19–32)
CREATININE: 0.69 mg/dL (ref 0.50–1.10)
Calcium: 10.1 mg/dL (ref 8.4–10.5)
Chloride: 98 mEq/L (ref 96–112)
GFR, EST NON AFRICAN AMERICAN: 89 mL/min
GFR, Est African American: >89 mL/min
Glucose, Bld: 103 mg/dL — ABNORMAL HIGH (ref 70–99)
POTASSIUM: 4 meq/L (ref 3.5–5.3)
Sodium: 142 mEq/L (ref 135–145)

## 2015-03-04 LAB — HEMOGLOBIN A1C
Hgb A1c MFr Bld: 5.6 % (ref <5.7)
Mean Plasma Glucose: 114 mg/dL (ref <117)

## 2015-03-04 LAB — CBC WITH DIFFERENTIAL/PLATELET
Basophils Absolute: 0 10*3/uL (ref 0.0–0.1)
Basophils Relative: 1 % (ref 0–1)
EOS PCT: 5 % (ref 0–5)
Eosinophils Absolute: 0.2 10*3/uL (ref 0.0–0.7)
HCT: 37.2 % (ref 36.0–46.0)
Hemoglobin: 12.5 g/dL (ref 12.0–15.0)
LYMPHS ABS: 1.1 10*3/uL (ref 0.7–4.0)
Lymphocytes Relative: 29 % (ref 12–46)
MCH: 26.7 pg (ref 26.0–34.0)
MCHC: 33.6 g/dL (ref 30.0–36.0)
MCV: 79.5 fL (ref 78.0–100.0)
MONOS PCT: 7 % (ref 3–12)
MPV: 10.2 fL (ref 8.6–12.4)
Monocytes Absolute: 0.3 10*3/uL (ref 0.1–1.0)
Neutro Abs: 2.1 10*3/uL (ref 1.7–7.7)
Neutrophils Relative %: 58 % (ref 43–77)
PLATELETS: 261 10*3/uL (ref 150–400)
RBC: 4.68 MIL/uL (ref 3.87–5.11)
RDW: 14.1 % (ref 11.5–15.5)
WBC: 3.7 10*3/uL — ABNORMAL LOW (ref 4.0–10.5)

## 2015-03-04 LAB — LIPID PANEL
CHOLESTEROL: 151 mg/dL (ref 0–200)
HDL: 57 mg/dL (ref >=46)
LDL CALC: 78 mg/dL (ref 0–99)
TRIGLYCERIDES: 80 mg/dL (ref <150)
Total CHOL/HDL Ratio: 2.6 Ratio
VLDL: 16 mg/dL (ref 0–40)

## 2015-03-04 LAB — HEPATIC FUNCTION PANEL
ALT: 9 U/L (ref 0–35)
AST: 16 U/L (ref 0–37)
Albumin: 4.4 g/dL (ref 3.5–5.2)
Alkaline Phosphatase: 76 U/L (ref 39–117)
Bilirubin, Direct: 0.1 mg/dL (ref 0.0–0.3)
Indirect Bilirubin: 0.4 mg/dL (ref 0.2–1.2)
TOTAL PROTEIN: 7.1 g/dL (ref 6.0–8.3)
Total Bilirubin: 0.5 mg/dL (ref 0.2–1.2)

## 2015-03-04 LAB — MAGNESIUM: Magnesium: 2 mg/dL (ref 1.5–2.5)

## 2015-03-04 NOTE — Patient Instructions (Signed)

## 2015-03-04 NOTE — Progress Notes (Signed)
Patient ID: Emily Jenkins, female   DOB: 1946-01-12, 69 y.o.   MRN: [id number]   This very nice 69 y.o. MBF presents for 3 month follow up with Hypertension, Hyperlipidemia, Pre-Diabetes and Vitamin D Deficiency.    Patient is treated for HTN since 2004 & BP has been controlled at home. Today's BP: 122/70 mmHg. Patient has had no complaints of any cardiac type chest pain, palpitations, dyspnea/orthopnea/PND, dizziness, claudication, or dependent edema.   Hyperlipidemia is controlled with diet & meds. Patient denies myalgias or other med SE's. Last Lipids were at goal - Chol 161; HDL 57; LDL 89; Triglycerides 75 on 12/02/2014.   Also, the patient has history of PreDiabetes and has had no symptoms of reactive hypoglycemia, diabetic polys, paresthesias or visual blurring.  Last A1c was 6.0% on 12/02/2014.    Further, the patient also has history of Vitamin D Deficiency and supplements vitamin D without any suspected side-effects. Last vitamin D was  65 on  12/02/2014.   Medication Sig  . aspirin 81 MG tablet Take 81 mg  daily.  Marland Kitchen atenolol  100 MG tablet Take 1/2 to 1 tab daily as directed for heart palpitations & BP  . CALTRATE 600+D PLUS  Take 1 tab 2  times daily.   . ferrous sulfate 325 (65 FE) MG tablet Take 325 mg  daily with breakfast.  . Vitafushion v b12 500mg  Take 1 tab daily.   . Vitafushion omega -3  daily Take 1 tab daily.   . ranitidine  300 MG tablet Take 1 tab with supper for indigestion and reflux  . simvastatin 20 MG tablet TAKE ONE TAB AT BEDTIME  . valsartan-hctz  320-25  TAKE ONE TAB ONCE DAILY FOR BLOOD PRESSURE  . vitamin C  500 MG tablet Take 500 mg  daily.  Marland Kitchen omeprazole  20 MG capsule TAKE ONE CAP ONCE DAILY   Allergies  Allergen Reactions  . Fosamax [Alendronate Sodium] Itching  . Sulfa Antibiotics Itching   PMHx:   Past Medical History  Diagnosis Date  . Hypertension   . Hyperlipidemia   . Pre-diabetes   . GERD (gastroesophageal reflux disease)   . RLS  (restless legs syndrome)   . DJD (degenerative joint disease)    Immunization History  Administered Date(s) Administered  . DTaP 12/11/2000  . Influenza Split 10/24/2013  . Influenza, High Dose Seasonal PF 09/17/2014  . Pneumococcal Polysaccharide-23 12/11/2002  . Tdap 07/11/2012  . Zoster 08/06/2013   Past Surgical History  Procedure Laterality Date  . Cholecystectomy    . Tubal ligation    . Ankle surgery    . Left heart catheterization with coronary angiogram N/A 07/29/2014    Procedure: LEFT HEART CATHETERIZATION WITH CORONARY ANGIOGRAM;  Surgeon: Burnell Blanks, MD;  Location: [facility] CATH LAB;  Service: Cardiovascular;  Laterality: N/A;   FHx:    Reviewed / unchanged  SHx:    Reviewed / unchanged  Systems Review:  Constitutional: Denies fever, chills, wt changes, headaches, insomnia, fatigue, night sweats, change in appetite. Eyes: Denies redness, blurred vision, diplopia, discharge, itchy, watery eyes.  ENT: Denies discharge, congestion, post nasal drip, epistaxis, sore throat, earache, hearing loss, dental pain, tinnitus, vertigo, sinus pain, snoring.  CV: Denies chest pain, palpitations, irregular heartbeat, syncope, dyspnea, diaphoresis, orthopnea, PND, claudication or edema. Respiratory: denies cough, dyspnea, DOE, pleurisy, hoarseness, laryngitis, wheezing.  Gastrointestinal: Denies dysphagia, odynophagia, heartburn, reflux, water brash, abdominal pain or cramps, nausea, vomiting, bloating, diarrhea, constipation, hematemesis, melena, hematochezia  or  hemorrhoids. Genitourinary: Denies dysuria, frequency, urgency, nocturia, hesitancy, discharge, hematuria or flank pain. Musculoskeletal: Denies arthralgias, myalgias, stiffness, jt. swelling, pain, limping or strain/sprain.  Skin: Denies pruritus, rash, hives, warts, acne, eczema or change in skin lesion(s). Neuro: No weakness, tremor, incoordination, spasms, paresthesia or pain. Psychiatric: Denies confusion, memory  loss or sensory loss. Endo: Denies change in weight, skin or hair change.  Heme/Lymph: No excessive bleeding, bruising or enlarged lymph nodes.  Physical Exam  BP 122/70  Pulse 80  Temp 97.3 F   Resp 16  Ht 5\' 6"    Wt 164 lb     BMI 26.48   Appears well nourished and in no distress. Eyes: PERRLA, EOMs, conjunctiva no swelling or erythema. Sinuses: No frontal/maxillary tenderness ENT/Mouth: EAC's clear, TM's nl w/o erythema, bulging. Nares clear w/o erythema, swelling, exudates. Oropharynx clear without erythema or exudates. Oral hygiene is good. Tongue normal, non obstructing. Hearing intact.  Neck: Supple. Thyroid nl. Car 2+/2+ without bruits, nodes or JVD. Chest: Respirations nl with BS clear & equal w/o rales, rhonchi, wheezing or stridor.  Cor: Heart sounds normal w/ regular rate and rhythm without sig. murmurs, gallops, clicks, or rubs. Peripheral pulses normal and equal  without edema.  Abdomen: Soft & bowel sounds normal. Non-tender w/o guarding, rebound, hernias, masses, or organomegaly.  Lymphatics: Unremarkable.  Musculoskeletal: Full ROM all peripheral extremities, joint stability, 5/5 strength, and normal gait.  Skin: Warm, dry without exposed rashes, lesions or ecchymosis apparent.  Neuro: Cranial nerves intact, reflexes equal bilaterally. Sensory-motor testing grossly intact. Tendon reflexes grossly intact.  Pysch: Alert & oriented x 3.  Insight and judgement nl & appropriate. No ideations.  Assessment and Plan:  1. Essential hypertension  - TSH  2. Mixed hyperlipidemia  - Lipid panel  3. Prediabetes  - Hemoglobin A1c - Insulin, random  4. Vitamin D deficiency  - Vit D  25 hydroxy (rtn osteoporosis monitoring)  5. Gastroesophageal reflux disease, esophagitis presence not specified  - omeprazole (PRILOSEC) 20 MG capsule;   6. Medication management  - CBC with Differential/Platelet - BASIC METABOLIC PANEL WITH GFR - Hepatic function panel -  Magnesium   Recommended regular exercise, BP monitoring, weight control, and discussed med and SE's. Recommended labs to assess and monitor clinical status. Further disposition pending results of labs. Over 30 minutes of exam, counseling, chart review was performed

## 2015-03-05 ENCOUNTER — Encounter: Payer: Self-pay | Admitting: Internal Medicine

## 2015-03-05 LAB — INSULIN, RANDOM: INSULIN: 7.3 u[IU]/mL (ref 2.0–19.6)

## 2015-03-05 LAB — TSH: TSH: 1.635 u[IU]/mL (ref 0.350–4.500)

## 2015-03-05 LAB — VITAMIN D 25 HYDROXY (VIT D DEFICIENCY, FRACTURES): Vit D, 25-Hydroxy: 48 ng/mL (ref 30–100)

## 2015-06-07 ENCOUNTER — Ambulatory Visit (INDEPENDENT_AMBULATORY_CARE_PROVIDER_SITE_OTHER): Payer: Medicare PPO | Admitting: Physician Assistant

## 2015-06-07 ENCOUNTER — Encounter: Payer: Self-pay | Admitting: Physician Assistant

## 2015-06-07 ENCOUNTER — Other Ambulatory Visit: Payer: Self-pay | Admitting: Internal Medicine

## 2015-06-07 VITALS — BP 122/68 | HR 76 | Temp 97.7°F | Resp 16 | Ht 66.0 in | Wt 165.0 lb

## 2015-06-07 DIAGNOSIS — Z0000 Encounter for general adult medical examination without abnormal findings: Secondary | ICD-10-CM

## 2015-06-07 DIAGNOSIS — I10 Essential (primary) hypertension: Secondary | ICD-10-CM

## 2015-06-07 DIAGNOSIS — R6889 Other general symptoms and signs: Secondary | ICD-10-CM

## 2015-06-07 DIAGNOSIS — R7303 Prediabetes: Secondary | ICD-10-CM

## 2015-06-07 DIAGNOSIS — E559 Vitamin D deficiency, unspecified: Secondary | ICD-10-CM

## 2015-06-07 DIAGNOSIS — Z0001 Encounter for general adult medical examination with abnormal findings: Secondary | ICD-10-CM

## 2015-06-07 DIAGNOSIS — K219 Gastro-esophageal reflux disease without esophagitis: Secondary | ICD-10-CM

## 2015-06-07 DIAGNOSIS — Z6826 Body mass index (BMI) 26.0-26.9, adult: Secondary | ICD-10-CM

## 2015-06-07 DIAGNOSIS — M545 Low back pain, unspecified: Secondary | ICD-10-CM

## 2015-06-07 DIAGNOSIS — E782 Mixed hyperlipidemia: Secondary | ICD-10-CM

## 2015-06-07 DIAGNOSIS — I8390 Asymptomatic varicose veins of unspecified lower extremity: Secondary | ICD-10-CM

## 2015-06-07 DIAGNOSIS — M8580 Other specified disorders of bone density and structure, unspecified site: Secondary | ICD-10-CM

## 2015-06-07 DIAGNOSIS — Z1331 Encounter for screening for depression: Secondary | ICD-10-CM

## 2015-06-07 DIAGNOSIS — R7309 Other abnormal glucose: Secondary | ICD-10-CM

## 2015-06-07 DIAGNOSIS — Z79899 Other long term (current) drug therapy: Secondary | ICD-10-CM

## 2015-06-07 DIAGNOSIS — M810 Age-related osteoporosis without current pathological fracture: Secondary | ICD-10-CM | POA: Insufficient documentation

## 2015-06-07 DIAGNOSIS — M25561 Pain in right knee: Secondary | ICD-10-CM

## 2015-06-07 DIAGNOSIS — Z9181 History of falling: Secondary | ICD-10-CM

## 2015-06-07 LAB — CBC WITH DIFFERENTIAL/PLATELET
Basophils Absolute: 0 10*3/uL (ref 0.0–0.1)
Basophils Relative: 1 % (ref 0–1)
Eosinophils Absolute: 0.1 10*3/uL (ref 0.0–0.7)
Eosinophils Relative: 2 % (ref 0–5)
HEMATOCRIT: 35.3 % — AB (ref 36.0–46.0)
HEMOGLOBIN: 12.2 g/dL (ref 12.0–15.0)
LYMPHS ABS: 0.9 10*3/uL (ref 0.7–4.0)
Lymphocytes Relative: 27 % (ref 12–46)
MCH: 27.2 pg (ref 26.0–34.0)
MCHC: 34.6 g/dL (ref 30.0–36.0)
MCV: 78.6 fL (ref 78.0–100.0)
MONO ABS: 0.2 10*3/uL (ref 0.1–1.0)
MPV: 10.3 fL (ref 8.6–12.4)
Monocytes Relative: 5 % (ref 3–12)
NEUTROS PCT: 65 % (ref 43–77)
Neutro Abs: 2.2 10*3/uL (ref 1.7–7.7)
Platelets: 242 10*3/uL (ref 150–400)
RBC: 4.49 MIL/uL (ref 3.87–5.11)
RDW: 14.8 % (ref 11.5–15.5)
WBC: 3.4 10*3/uL — ABNORMAL LOW (ref 4.0–10.5)

## 2015-06-07 MED ORDER — MELOXICAM 15 MG PO TABS: ORAL_TABLET | ORAL | Status: DC

## 2015-06-07 NOTE — Progress Notes (Signed)
Medicare wellness and 3 month OV Assessment:   1. Essential hypertension - continue medications, DASH diet, exercise and monitor at home. Call if greater than 130/80.  - CBC with Differential/Platelet - BASIC METABOLIC PANEL WITH GFR - Hepatic function panel - TSH - Urinalysis, Routine w reflex microscopic (not at [facility]) - Microalbumin / creatinine urine ratio - EKG 12-Lead  2. Prediabetes Discussed general issues about diabetes pathophysiology and management., Educational material distributed., Suggested low cholesterol diet., Encouraged aerobic exercise., Discussed foot care., Reminded to get yearly retinal exam. - Hemoglobin A1c - Insulin, fasting - HM DIABETES FOOT EXAM  3. Mixed hyperlipidemia -continue medications, check lipids, decrease fatty foods, increase activity.  - Lipid panel  4. Varicose veins Varicose veins- weight loss discussed, continue compression stockings and elevation  5. Osteopenia DEXA due in 2017  6. Vitamin D deficiency - Vit D  25 hydroxy (rtn osteoporosis monitoring)  7. Medication management - Magnesium  8. Gastroesophageal reflux disease, esophagitis presence not specified Continue PPI/H2 blocker, diet discussed  9. Recurrent knee pain, right - Uric acid - Ambulatory referral to Physical Therapy - meloxicam (MOBIC) 15 MG tablet; Take one daily with food for 2 weeks, can take with tylenol, can not take with aleve, iburpofen, then as needed daily for pain  Dispense: 30 tablet; Refill: 1  10. Routine general medical examination at a health care facility - will call Dr. Carlean Purl for colonoscopy  11. Screening for depression negative  12. Bilateral low back pain without sciatica - Urine culture - Ambulatory referral to Physical Therapy - meloxicam (MOBIC) 15 MG tablet; Take one daily with food for 2 weeks, can take with tylenol, can not take with aleve, iburpofen, then as needed daily for pain  Dispense: 30 tablet; Refill: 1   Plan:    During the course of the visit the patient was educated and counseled about appropriate screening and preventive services including:    Pneumococcal vaccine   Influenza vaccine  Td vaccine  Screening electrocardiogram  Screening mammography  Bone densitometry screening  Colorectal cancer screening  Diabetes screening  Glaucoma screening  Nutrition counseling   Advanced directives: given information  Conditions/risks identified: BMI: Discussed weight loss, diet, and increase physical activity.  Increase physical activity: AHA recommends 150 minutes of physical activity a week.  Medications reviewed Diabetes is at goal Urinary Incontinence is not an issue: discussed non pharmacology and pharmacology options.  Fall risk: low- discussed PT, home fall assessment, medications.   Subjective:   Emily Jenkins is a 69 y.o. female who presents for Medicare Annual Wellness Visit and 3 month follow up on hypertension, prediabetes, hyperlipidemia, vitamin D def.  Date of last medicare wellness visit was 04/14/2014  Her blood pressure has been controlled at home, today their BP is BP: 122/68 mmHg She does workout. She denies shortness of breath, dizziness.  She is on cholesterol medication and denies myalgias. Her cholesterol is at goal. The cholesterol last visit was:   Lab Results  Component Value Date   CHOL 151 03/04/2015   HDL 57 03/04/2015   LDLCALC 78 03/04/2015   TRIG 80 03/04/2015   CHOLHDL 2.6 03/04/2015   She has been working on diet and exercise for prediabetes, and denies paresthesia of the feet, polydipsia and polyuria. Last A1C in the office was:  Lab Results  Component Value Date   HGBA1C 5.6 03/04/2015   Patient is on Vitamin D supplement. Has seen Dr. Lorin Mercy for right knee pain, has had injections but  continues to have pain, worse with walking, not catching/locking like in the past. She has not done PT for it.  She is also having some lower back  pain with increasing nocturia and would like a urine checked.   Names of Other Physician/Practitioners you currently use: 1. [facility]- here for primary care 2. [facility] eye doctor, Dr. Wynetta Emery, last visit last year 3. Dr. Harrington Challenger , dentist, last visit feb 2016 q 6 months  Patient Care Team: Unk Pinto, MD as PCP - General (Internal Medicine) Celestia Khat, OD (Optometry) Shela Leff, MD as Consulting Physician (Orthopedic Surgery)  Medication Review Current Outpatient Prescriptions on File Prior to Visit  Medication Sig Dispense Refill  . aspirin 81 MG tablet Take 81 mg by mouth daily.    Marland Kitchen atenolol (TENORMIN) 100 MG tablet Take 1/2 to 1 tablet daily as directed for heart palpitations & BP 90 tablet 99  . Calcium Carbonate-Vit D-Min (CALTRATE 600+D PLUS PO) Take 1 tablet by mouth 2 (two) times daily.     . ferrous sulfate 325 (65 FE) MG tablet Take 325 mg by mouth daily with breakfast.    . OVER THE COUNTER MEDICATION Take 1 tablet by mouth daily. Vitafushion v b12 500mg     . OVER THE COUNTER MEDICATION Take 1 tablet by mouth daily. Vitafushion omega -3  daily    . ranitidine (ZANTAC) 300 MG tablet Take 1 tablet daily with supper for indigestion and reflux 90 tablet 99  . valsartan-hydrochlorothiazide (DIOVAN-HCT) 320-25 MG per tablet TAKE ONE TABLET BY MOUTH ONCE DAILY FOR BLOOD PRESSURE 30 tablet 3  . vitamin C (ASCORBIC ACID) 500 MG tablet Take 500 mg by mouth daily.     No current facility-administered medications on file prior to visit.    Current Problems (verified) Patient Active Problem List   Diagnosis Date Noted  . Esophageal reflux 03/04/2015  . Palpitations 09/17/2014  . Medication management 01/05/2014  . Essential hypertension 12/02/2013  . Mixed hyperlipidemia 12/02/2013  . Prediabetes 12/02/2013  . Vitamin D deficiency 12/02/2013  . Varicose veins of lower extremities with other complications  25/95/6387    Screening Tests Health Maintenance  Topic Date Due  . PNA vac Low Risk Adult (1 of 2 - PCV13) 08/20/2010  . COLONOSCOPY  11/17/2013  . INFLUENZA VACCINE  07/12/2015  . MAMMOGRAM  10/15/2016  . TETANUS/TDAP  07/11/2022  . DEXA SCAN  Completed  . ZOSTAVAX  Completed     Immunization History  Administered Date(s) Administered  . DTaP 12/11/2000  . Influenza Split 10/24/2013  . Influenza, High Dose Seasonal PF 09/17/2014  . Pneumococcal Polysaccharide-23 12/11/2002  . Tdap 07/11/2012  . Zoster 08/06/2013    Preventative care: Last colonoscopy: 2004 Dr. Carlean Purl OVER DUE Last mammogram: 10/2014 at [place] Last pap smear/pelvic exam: 2014 DEXA: 11//2015 + osteopenia Ct chest 2014 CXR 07/2014 MRI spine 2010 Stress test 07/2014 Cath 07/2014 normal  Prior vaccinations: TD or Tdap: 2013  Influenza: 2015 Pneumococcal: 2004 Prevnar 13: DUE but out of in the office Shingles/Zostavax: 2014  Allergies Allergies  Allergen Reactions  . Fosamax [Alendronate Sodium] Itching  . Sulfa Antibiotics Itching   Surgical history Past Surgical History  Procedure Laterality Date  . Cholecystectomy    . Tubal ligation    . Ankle surgery    . Left heart catheterization with coronary angiogram N/A 07/29/2014    Procedure: LEFT HEART CATHETERIZATION WITH CORONARY ANGIOGRAM;  Surgeon: Burnell Blanks, MD;  Location: [facility] [place] CATH LAB;  Service: Cardiovascular;  Laterality: N/A;   Family history Family History  Problem Relation Age of Onset  . Heart disease Mother 27    stent  . Hypertension Mother   . Cancer Father   . Diabetes Father   . Hyperlipidemia Sister   . Hypertension Sister    Risk Factors: Osteoporosis: postmenopausal estrogen deficiency and dietary calcium and/or vitamin D deficiency History of fracture in the past year: no  Tobacco History  Substance Use Topics  . Smoking status: Never Smoker   . Smokeless tobacco: Not on file     Comment: SPOUSE  SMOKES  . Alcohol Use: No   She does not smoke.  Patient is not a former smoker. Are there smokers in your home (other than you)?  No  Alcohol Current alcohol use: none  Caffeine Current caffeine use: coffee 1 /day  Exercise Current exercise: walking  Nutrition/Diet Current diet: in general, a "healthy" diet    Cardiac risk factors: advanced age (older than 51 for men, 28 for women), dyslipidemia, family history of premature cardiovascular disease, hypertension, obesity (BMI >= 30 kg/m2) and sedentary lifestyle.  Depression Screen (Note: if answer to either of the following is "Yes", a more complete depression screening is indicated)   Q1: Over the past two weeks, have you felt down, depressed or hopeless? No  Q2: Over the past two weeks, have you felt little interest or pleasure in doing things? No  Have you lost interest or pleasure in daily life? No  Do you often feel hopeless? No  Do you cry easily over simple problems? No  Activities of Daily Living In your present state of health, do you have any difficulty performing the following activities?:  Driving? No Managing money?  No Feeding yourself? No Getting from bed to chair? No Climbing a flight of stairs? No Preparing food and eating?: No Bathing or showering? No Getting dressed: No Getting to the toilet? No Using the toilet:No Moving around from place to place: No In the past year have you fallen or had a near fall?:No   Are you sexually active?  No  Do you have more than one partner?  No  Vision Difficulties: No  Hearing Difficulties: No Do you often ask people to speak up or repeat themselves? No Do you experience ringing or noises in your ears? No Do you have difficulty understanding soft or whispered voices? No  Cognition  Do you feel that you have a problem with memory?No  Do you often misplace items? No  Do you feel safe at home?  Yes  Advanced directives Does patient have a [place]? No, has papers but have not finished them Does patient have a Living Will? No   Objective:   Blood pressure 122/68, pulse 76, temperature 97.7 F (36.5 C), resp. rate 16, height 5\' 6"  (1.676 m), weight 165 lb (74.844 kg). Body mass index is 26.64 kg/(m^2).  General appearance: alert, no distress, WD/WN,  female Cognitive Testing  Alert? Yes  Normal Appearance?Yes  Oriented to person? Yes  Place? Yes   Time? Yes  Recall of three objects?  Yes  Can perform simple calculations? Yes  Displays appropriate judgment?Yes  Can read the correct time from a watch face?Yes  HEENT: normocephalic, sclerae anicteric, TMs pearly, nares patent, no discharge or erythema, pharynx normal Oral cavity: MMM, no lesions Neck: supple, no lymphadenopathy, no thyromegaly, no masses Heart: RRR, normal S1, S2, no murmurs  Lungs: CTA bilaterally, no wheezes, rhonchi, or rales Abdomen: +bs, soft, tender epigastric, non distended, no masses, no hepatomegaly, no splenomegaly Musculoskeletal: tender right knee along joint line, pain with back with change in position, no swelling, no obvious deformity Extremities: 1+ edema, no cyanosis, no clubbing Pulses: 2+ symmetric, upper and lower extremities, normal cap refill Neurological: alert, oriented x 3, CN2-12 intact, strength normal upper extremities and lower extremities, sensation normal throughout, DTRs 2+ throughout, no cerebellar signs, gait normal Psychiatric: normal affect, behavior normal, pleasant  Breast: defer Gyn: defer Rectal: defer   Medicare Attestation I have personally reviewed: The patient's medical and social history Their use of alcohol, tobacco or illicit drugs Their current medications and supplements The patient's functional ability including ADLs,fall risks, home safety risks, cognitive, and hearing and visual impairment Diet and physical activities Evidence for depression or mood disorders  The patient's weight, height,  BMI, and visual acuity have been recorded in the chart.  I have made referrals, counseling, and provided education to the patient based on review of the above and I have provided the patient with a written personalized care plan for preventive services.     Vicie Mutters, PA-C   06/07/2015

## 2015-06-07 NOTE — Patient Instructions (Signed)
HOME CARE INSTRUCTIONS   Do not stand or sit in one position for long periods of time. Do not sit with your legs crossed. Rest with your legs raised during the day.  Your legs have to be higher than your heart so that gravity will force the valves to open, so please really elevate your legs.   Wear elastic stockings or support hose. Do not wear other tight, encircling garments around the legs, pelvis, or waist.  [employer]  has a wide variety of well priced compression stockings. [place], [place] [id number] [phone]  Walk as much as possible to increase blood flow.  Raise the foot of your bed at night with 2-inch blocks. SEEK MEDICAL CARE IF:   The skin around your ankle starts to break down.  You have pain, redness, tenderness, or hard swelling developing in your leg over a vein.  You are uncomfortable due to leg pain. Document Released: 09/06/2005 Document Revised: 02/19/2012 Document Reviewed: 01/23/2011 [facility] Patient Information 2014 [place].  Benefiber is good for constipation/diarrhea/irritable bowel syndrome, it helps with weight loss and can help lower your bad cholesterol. Please do 1-2 TBSP in the morning in water, coffee, or tea. It can take up to a month before you can see a difference with your bowel movements. It is cheapest from [employer], [employer], [employer].   Will set you up for PT and please take the mobic once a day as needed for pain. You may need to follow up with Dr. Lorin Mercy.   Preventive Care for Adults A healthy lifestyle and preventive care can promote health and wellness. Preventive health guidelines for women include the following key practices.  A routine yearly physical is a good way to check with your health care provider about your health and preventive screening. It is a chance to share any concerns and updates on your health and to receive a thorough exam.  Visit your dentist for a routine exam and preventive care every 6  months. Brush your teeth twice a day and floss once a day. Good oral hygiene prevents tooth decay and gum disease.  The frequency of eye exams is based on your age, health, family medical history, use of contact lenses, and other factors. Follow your health care provider's recommendations for frequency of eye exams.  Eat a healthy diet. Foods like vegetables, fruits, whole grains, low-fat dairy products, and lean protein foods contain the nutrients you need without too many calories. Decrease your intake of foods high in solid fats, added sugars, and salt. Eat the right amount of calories for you.Get information about a proper diet from your health care provider, if necessary.  Regular physical exercise is one of the most important things you can do for your health. Most adults should get at least 150 minutes of moderate-intensity exercise (any activity that increases your heart rate and causes you to sweat) each week. In addition, most adults need muscle-strengthening exercises on 2 or more days a week.  Maintain a healthy weight. The body mass index (BMI) is a screening tool to identify possible weight problems. It provides an estimate of body fat based on height and weight. Your health care provider can find your BMI and can help you achieve or maintain a healthy weight.For adults 20 years and older:  A BMI below 18.5 is considered underweight.  A BMI of 18.5 to 24.9 is normal.  A BMI of 25 to 29.9 is considered overweight.  A BMI of  30 and above is considered obese.  Maintain normal blood lipids and cholesterol levels by exercising and minimizing your intake of saturated fat. Eat a balanced diet with plenty of fruit and vegetables. If your lipid or cholesterol levels are high, you are over 50, or you are at high risk for heart disease, you may need your cholesterol levels checked more frequently.Ongoing high lipid and cholesterol levels should be treated with medicines if diet and exercise  are not working.  If you smoke, find out from your health care provider how to quit. If you do not use tobacco, do not start.  Lung cancer screening is recommended for adults aged 56-80 years who are at high risk for developing lung cancer because of a history of smoking. A yearly low-dose CT scan of the lungs is recommended for people who have at least a 30-pack-year history of smoking and are a current smoker or have quit within the past 15 years. A pack year of smoking is smoking an average of 1 pack of cigarettes a day for 1 year (for example: 1 pack a day for 30 years or 2 packs a day for 15 years). Yearly screening should continue until the smoker has stopped smoking for at least 15 years. Yearly screening should be stopped for people who develop a health problem that would prevent them from having lung cancer treatment.  Avoid use of street drugs. Do not share needles with anyone. Ask for help if you need support or instructions about stopping the use of drugs.  High blood pressure causes heart disease and increases the risk of stroke.  Ongoing high blood pressure should be treated with medicines if weight loss and exercise do not work.  If you are 21-23 years old, ask your health care provider if you should take aspirin to prevent strokes.  Diabetes screening involves taking a blood sample to check your fasting blood sugar level. This should be done once every 3 years, after age 105, if you are within normal weight and without risk factors for diabetes. Testing should be considered at a younger age or be carried out more frequently if you are overweight and have at least 1 risk factor for diabetes.  Breast cancer screening is essential preventive care for women. You should practice "breast self-awareness." This means understanding the normal appearance and feel of your breasts and may include breast self-examination. Any changes detected, no matter how small, should be reported to a health care  provider. Women in their 30s and 30s should have a clinical breast exam (CBE) by a health care provider as part of a regular health exam every 1 to 3 years. After age 4, women should have a CBE every year. Starting at age 19, women should consider having a mammogram (breast X-ray test) every year. Women who have a family history of breast cancer should talk to their health care provider about genetic screening. Women at a high risk of breast cancer should talk to their health care providers about having an MRI and a mammogram every year.  Breast cancer gene (BRCA)-related cancer risk assessment is recommended for women who have family members with BRCA-related cancers. BRCA-related cancers include breast, ovarian, tubal, and peritoneal cancers. Having family members with these cancers may be associated with an increased risk for harmful changes (mutations) in the breast cancer genes BRCA1 and BRCA2. Results of the assessment will determine the need for genetic counseling and BRCA1 and BRCA2 testing.  Routine pelvic exams to screen for  cancer are no longer recommended for nonpregnant women who are considered low risk for cancer of the pelvic organs (ovaries, uterus, and vagina) and who do not have symptoms. Ask your health care provider if a screening pelvic exam is right for you.  If you have had past treatment for cervical cancer or a condition that could lead to cancer, you need Pap tests and screening for cancer for at least 20 years after your treatment. If Pap tests have been discontinued, your risk factors (such as having a new sexual partner) need to be reassessed to determine if screening should be resumed. Some women have medical problems that increase the chance of getting cervical cancer. In these cases, your health care provider may recommend more frequent screening and Pap tests.    Colorectal cancer can be detected and often prevented. Most routine colorectal cancer screening begins at the  age of 66 years and continues through age 76 years. However, your health care provider may recommend screening at an earlier age if you have risk factors for colon cancer. On a yearly basis, your health care provider may provide home test kits to check for hidden blood in the stool. Use of a small camera at the end of a tube, to directly examine the colon (sigmoidoscopy or colonoscopy), can detect the earliest forms of colorectal cancer. Talk to your health care provider about this at age 35, when routine screening begins. Direct exam of the colon should be repeated every 5-10 years through age 96 years, unless early forms of pre-cancerous polyps or small growths are found.  Osteoporosis is a disease in which the bones lose minerals and strength with aging. This can result in serious bone fractures or breaks. The risk of osteoporosis can be identified using a bone density scan. Women ages 5 years and over and women at risk for fractures or osteoporosis should discuss screening with their health care providers. Ask your health care provider whether you should take a calcium supplement or vitamin D to reduce the rate of osteoporosis.  Menopause can be associated with physical symptoms and risks. Hormone replacement therapy is available to decrease symptoms and risks. You should talk to your health care provider about whether hormone replacement therapy is right for you.  Use sunscreen. Apply sunscreen liberally and repeatedly throughout the day. You should seek shade when your shadow is shorter than you. Protect yourself by wearing long sleeves, pants, a wide-brimmed hat, and sunglasses year round, whenever you are outdoors.  Once a month, do a whole body skin exam, using a mirror to look at the skin on your back. Tell your health care provider of new moles, moles that have irregular borders, moles that are larger than a pencil eraser, or moles that have changed in shape or color.  Stay current with  required vaccines (immunizations).  Influenza vaccine. All adults should be immunized every year.  Tetanus, diphtheria, and acellular pertussis (Td, Tdap) vaccine. Pregnant women should receive 1 dose of Tdap vaccine during each pregnancy. The dose should be obtained regardless of the length of time since the last dose. Immunization is preferred during the 27th-36th week of gestation. An adult who has not previously received Tdap or who does not know her vaccine status should receive 1 dose of Tdap. This initial dose should be followed by tetanus and diphtheria toxoids (Td) booster doses every 10 years. Adults with an unknown or incomplete history of completing a 3-dose immunization series with Td-containing vaccines should begin or complete  a primary immunization series including a Tdap dose. Adults should receive a Td booster every 10 years.    Zoster vaccine. One dose is recommended for adults aged 36 years or older unless certain conditions are present.    Pneumococcal 13-valent conjugate (PCV13) vaccine. When indicated, a person who is uncertain of her immunization history and has no record of immunization should receive the PCV13 vaccine. An adult aged 32 years or older who has certain medical conditions and has not been previously immunized should receive 1 dose of PCV13 vaccine. This PCV13 should be followed with a dose of pneumococcal polysaccharide (PPSV23) vaccine. The PPSV23 vaccine dose should be obtained at least 8 weeks after the dose of PCV13 vaccine. An adult aged 11 years or older who has certain medical conditions and previously received 1 or more doses of PPSV23 vaccine should receive 1 dose of PCV13. The PCV13 vaccine dose should be obtained 1 or more years after the last PPSV23 vaccine dose.    Pneumococcal polysaccharide (PPSV23) vaccine. When PCV13 is also indicated, PCV13 should be obtained first. All adults aged 83 years and older should be immunized. An adult younger than  age 63 years who has certain medical conditions should be immunized. Any person who resides in a nursing home or long-term care facility should be immunized. An adult smoker should be immunized. People with an immunocompromised condition and certain other conditions should receive both PCV13 and PPSV23 vaccines. People with human immunodeficiency virus (HIV) infection should be immunized as soon as possible after diagnosis. Immunization during chemotherapy or radiation therapy should be avoided. Routine use of PPSV23 vaccine is not recommended for American Indians, [place] Natives, or people younger than 65 years unless there are medical conditions that require PPSV23 vaccine. When indicated, people who have unknown immunization and have no record of immunization should receive PPSV23 vaccine. One-time revaccination 5 years after the first dose of PPSV23 is recommended for people aged 19-64 years who have chronic kidney failure, nephrotic syndrome, asplenia, or immunocompromised conditions. People who received 1-2 doses of PPSV23 before age 42 years should receive another dose of PPSV23 vaccine at age 32 years or later if at least 5 years have passed since the previous dose. Doses of PPSV23 are not needed for people immunized with PPSV23 at or after age 47 years.   Preventive Services / Frequency  Ages 67 years and over  Blood pressure check.  Lipid and cholesterol check.  Lung cancer screening. / Every year if you are aged 3-80 years and have a 30-pack-year history of smoking and currently smoke or have quit within the past 15 years. Yearly screening is stopped once you have quit smoking for at least 15 years or develop a health problem that would prevent you from having lung cancer treatment.  Clinical breast exam.** / Every year after age 26 years.  BRCA-related cancer risk assessment.** / For women who have family members with a BRCA-related cancer (breast, ovarian, tubal, or peritoneal  cancers).  Mammogram.** / Every year beginning at age 69 years and continuing for as long as you are in good health. Consult with your health care provider.  Pap test.** / Every 3 years starting at age 5 years through age 70 or 67 years with 3 consecutive normal Pap tests. Testing can be stopped between 65 and 70 years with 3 consecutive normal Pap tests and no abnormal Pap or HPV tests in the past 10 years.  Fecal occult blood test (FOBT) of stool. / Every  year beginning at age 65 years and continuing until age 29 years. You may not need to do this test if you get a colonoscopy every 10 years.  Flexible sigmoidoscopy or colonoscopy.** / Every 5 years for a flexible sigmoidoscopy or every 10 years for a colonoscopy beginning at age 60 years and continuing until age 63 years.  Hepatitis C blood test.** / For all people born from [birth year] through 1966 and any individual with known risks for hepatitis C.  Osteoporosis screening.** / A one-time screening for women ages 46 years and over and women at risk for fractures or osteoporosis.  Skin self-exam. / Monthly.  Influenza vaccine. / Every year.  Tetanus, diphtheria, and acellular pertussis (Tdap/Td) vaccine.** / 1 dose of Td every 10 years.  Zoster vaccine.** / 1 dose for adults aged 15 years or older.  Pneumococcal 13-valent conjugate (PCV13) vaccine.** / Consult your health care provider.  Pneumococcal polysaccharide (PPSV23) vaccine.** / 1 dose for all adults aged 82 years and older. Screening for abdominal aortic aneurysm (AAA)  by ultrasound is recommended for people who have history of high blood pressure or who are current or former smokers.

## 2015-06-08 LAB — HEPATIC FUNCTION PANEL
ALBUMIN: 4.1 g/dL (ref 3.5–5.2)
ALK PHOS: 81 U/L (ref 39–117)
ALT: 8 U/L (ref 0–35)
AST: 15 U/L (ref 0–37)
BILIRUBIN DIRECT: 0.1 mg/dL (ref 0.0–0.3)
BILIRUBIN TOTAL: 0.4 mg/dL (ref 0.2–1.2)
Indirect Bilirubin: 0.3 mg/dL (ref 0.2–1.2)
Total Protein: 6.9 g/dL (ref 6.0–8.3)

## 2015-06-08 LAB — BASIC METABOLIC PANEL WITH GFR
BUN: 11 mg/dL (ref 6–23)
CALCIUM: 10.1 mg/dL (ref 8.4–10.5)
CO2: 31 mEq/L (ref 19–32)
Chloride: 103 mEq/L (ref 96–112)
Creat: 0.67 mg/dL (ref 0.50–1.10)
GLUCOSE: 109 mg/dL — AB (ref 70–99)
POTASSIUM: 3.8 meq/L (ref 3.5–5.3)
Sodium: 142 mEq/L (ref 135–145)

## 2015-06-08 LAB — MICROALBUMIN / CREATININE URINE RATIO
CREATININE, URINE: 82.7 mg/dL
MICROALB UR: 0.4 mg/dL (ref <2.0)
MICROALB/CREAT RATIO: 4.8 mg/g (ref 0.0–30.0)

## 2015-06-08 LAB — URINALYSIS, ROUTINE W REFLEX MICROSCOPIC
Bilirubin Urine: NEGATIVE
Glucose, UA: NEGATIVE mg/dL
HGB URINE DIPSTICK: NEGATIVE
Ketones, ur: NEGATIVE mg/dL
LEUKOCYTES UA: NEGATIVE
NITRITE: NEGATIVE
PROTEIN: NEGATIVE mg/dL
SPECIFIC GRAVITY, URINE: 1.013 (ref 1.005–1.030)
UROBILINOGEN UA: 0.2 mg/dL (ref 0.0–1.0)
pH: 5 (ref 5.0–8.0)

## 2015-06-08 LAB — URIC ACID: URIC ACID, SERUM: 5.1 mg/dL (ref 2.4–7.0)

## 2015-06-08 LAB — LIPID PANEL
CHOL/HDL RATIO: 2.4 ratio
CHOLESTEROL: 160 mg/dL (ref 0–200)
HDL: 66 mg/dL (ref >=46)
LDL CALC: 79 mg/dL (ref 0–99)
TRIGLYCERIDES: 76 mg/dL (ref <150)
VLDL: 15 mg/dL (ref 0–40)

## 2015-06-08 LAB — HEMOGLOBIN A1C
Hgb A1c MFr Bld: 6.1 % — ABNORMAL HIGH (ref <5.7)
Mean Plasma Glucose: 128 mg/dL — ABNORMAL HIGH (ref <117)

## 2015-06-08 LAB — INSULIN, FASTING: Insulin fasting, serum: 14.1 u[IU]/mL (ref 2.0–19.6)

## 2015-06-08 LAB — VITAMIN D 25 HYDROXY (VIT D DEFICIENCY, FRACTURES): Vit D, 25-Hydroxy: 56 ng/mL (ref 30–100)

## 2015-06-08 LAB — MAGNESIUM: MAGNESIUM: 2.1 mg/dL (ref 1.5–2.5)

## 2015-06-08 LAB — TSH: TSH: 1.609 u[IU]/mL (ref 0.350–4.500)

## 2015-06-09 LAB — URINE CULTURE
Colony Count: NO GROWTH
ORGANISM ID, BACTERIA: NO GROWTH

## 2015-06-23 ENCOUNTER — Other Ambulatory Visit: Payer: Self-pay | Admitting: Internal Medicine

## 2015-07-13 ENCOUNTER — Other Ambulatory Visit: Payer: Self-pay | Admitting: Internal Medicine

## 2015-07-14 ENCOUNTER — Other Ambulatory Visit: Payer: Self-pay | Admitting: *Deleted

## 2015-07-14 MED ORDER — VALSARTAN-HYDROCHLOROTHIAZIDE 320-25 MG PO TABS: ORAL_TABLET | ORAL | Status: DC

## 2015-08-19 ENCOUNTER — Encounter: Payer: Self-pay | Admitting: Internal Medicine

## 2015-09-09 ENCOUNTER — Ambulatory Visit (INDEPENDENT_AMBULATORY_CARE_PROVIDER_SITE_OTHER): Payer: Medicare PPO | Admitting: *Deleted

## 2015-09-09 DIAGNOSIS — Z23 Encounter for immunization: Secondary | ICD-10-CM

## 2015-09-21 ENCOUNTER — Other Ambulatory Visit: Payer: Self-pay | Admitting: Physician Assistant

## 2015-10-11 ENCOUNTER — Encounter: Payer: Self-pay | Admitting: Internal Medicine

## 2015-10-29 ENCOUNTER — Ambulatory Visit (INDEPENDENT_AMBULATORY_CARE_PROVIDER_SITE_OTHER): Payer: Medicare PPO | Admitting: Internal Medicine

## 2015-10-29 ENCOUNTER — Encounter: Payer: Self-pay | Admitting: Internal Medicine

## 2015-10-29 VITALS — BP 108/72 | HR 88 | Temp 97.5°F | Resp 16 | Ht 65.5 in | Wt 158.6 lb

## 2015-10-29 DIAGNOSIS — Z1331 Encounter for screening for depression: Secondary | ICD-10-CM

## 2015-10-29 DIAGNOSIS — Z9181 History of falling: Secondary | ICD-10-CM

## 2015-10-29 DIAGNOSIS — K219 Gastro-esophageal reflux disease without esophagitis: Secondary | ICD-10-CM | POA: Diagnosis not present

## 2015-10-29 DIAGNOSIS — F4321 Adjustment disorder with depressed mood: Secondary | ICD-10-CM | POA: Diagnosis not present

## 2015-10-29 DIAGNOSIS — Z79899 Other long term (current) drug therapy: Secondary | ICD-10-CM

## 2015-10-29 DIAGNOSIS — Z6826 Body mass index (BMI) 26.0-26.9, adult: Secondary | ICD-10-CM

## 2015-10-29 DIAGNOSIS — E782 Mixed hyperlipidemia: Secondary | ICD-10-CM | POA: Diagnosis not present

## 2015-10-29 DIAGNOSIS — Z1389 Encounter for screening for other disorder: Secondary | ICD-10-CM | POA: Diagnosis not present

## 2015-10-29 DIAGNOSIS — F329 Major depressive disorder, single episode, unspecified: Secondary | ICD-10-CM

## 2015-10-29 DIAGNOSIS — E559 Vitamin D deficiency, unspecified: Secondary | ICD-10-CM | POA: Diagnosis not present

## 2015-10-29 DIAGNOSIS — R7303 Prediabetes: Secondary | ICD-10-CM | POA: Diagnosis not present

## 2015-10-29 DIAGNOSIS — Z1212 Encounter for screening for malignant neoplasm of rectum: Secondary | ICD-10-CM

## 2015-10-29 DIAGNOSIS — I10 Essential (primary) hypertension: Secondary | ICD-10-CM

## 2015-10-29 DIAGNOSIS — E663 Overweight: Secondary | ICD-10-CM | POA: Insufficient documentation

## 2015-10-29 DIAGNOSIS — Z789 Other specified health status: Secondary | ICD-10-CM | POA: Diagnosis not present

## 2015-10-29 LAB — CBC WITH DIFFERENTIAL/PLATELET
BASOS PCT: 1 % (ref 0–1)
Basophils Absolute: 0 10*3/uL (ref 0.0–0.1)
Eosinophils Absolute: 0.1 10*3/uL (ref 0.0–0.7)
Eosinophils Relative: 4 % (ref 0–5)
HCT: 37.2 % (ref 36.0–46.0)
HEMOGLOBIN: 12.7 g/dL (ref 12.0–15.0)
LYMPHS ABS: 0.8 10*3/uL (ref 0.7–4.0)
Lymphocytes Relative: 26 % (ref 12–46)
MCH: 27.3 pg (ref 26.0–34.0)
MCHC: 34.1 g/dL (ref 30.0–36.0)
MCV: 80 fL (ref 78.0–100.0)
MONO ABS: 0.2 10*3/uL (ref 0.1–1.0)
MONOS PCT: 7 % (ref 3–12)
MPV: 10.6 fL (ref 8.6–12.4)
NEUTROS ABS: 1.9 10*3/uL (ref 1.7–7.7)
Neutrophils Relative %: 62 % (ref 43–77)
Platelets: 243 10*3/uL (ref 150–400)
RBC: 4.65 MIL/uL (ref 3.87–5.11)
RDW: 14.5 % (ref 11.5–15.5)
WBC: 3 10*3/uL — ABNORMAL LOW (ref 4.0–10.5)

## 2015-10-29 LAB — HEMOGLOBIN A1C
Hgb A1c MFr Bld: 6.1 % — ABNORMAL HIGH (ref <5.7)
MEAN PLASMA GLUCOSE: 128 mg/dL — AB (ref <117)

## 2015-10-29 LAB — HEPATIC FUNCTION PANEL
ALBUMIN: 4.1 g/dL (ref 3.6–5.1)
ALT: 7 U/L (ref 6–29)
AST: 15 U/L (ref 10–35)
Alkaline Phosphatase: 78 U/L (ref 33–130)
Bilirubin, Direct: 0.1 mg/dL (ref <=0.2)
Indirect Bilirubin: 0.4 mg/dL (ref 0.2–1.2)
TOTAL PROTEIN: 6.7 g/dL (ref 6.1–8.1)
Total Bilirubin: 0.5 mg/dL (ref 0.2–1.2)

## 2015-10-29 LAB — BASIC METABOLIC PANEL WITH GFR
BUN: 11 mg/dL (ref 7–25)
CALCIUM: 9.8 mg/dL (ref 8.6–10.4)
CHLORIDE: 104 mmol/L (ref 98–110)
CO2: 29 mmol/L (ref 20–31)
CREATININE: 0.71 mg/dL (ref 0.60–0.93)
GFR, Est African American: >89 mL/min (ref >=60)
GFR, Est Non African American: 87 mL/min (ref >=60)
GLUCOSE: 99 mg/dL (ref 65–99)
Potassium: 4 mmol/L (ref 3.5–5.3)
SODIUM: 142 mmol/L (ref 135–146)

## 2015-10-29 LAB — LIPID PANEL
CHOLESTEROL: 150 mg/dL (ref 125–200)
HDL: 64 mg/dL (ref >=46)
LDL CALC: 73 mg/dL (ref <130)
TRIGLYCERIDES: 66 mg/dL (ref <150)
Total CHOL/HDL Ratio: 2.3 Ratio (ref <=5.0)
VLDL: 13 mg/dL (ref <30)

## 2015-10-29 LAB — MAGNESIUM: MAGNESIUM: 1.9 mg/dL (ref 1.5–2.5)

## 2015-10-29 LAB — TSH: TSH: 2.226 u[IU]/mL (ref 0.350–4.500)

## 2015-10-29 NOTE — Patient Instructions (Signed)

## 2015-10-29 NOTE — Progress Notes (Signed)
Patient ID: Emily Jenkins, female   DOB: [birth year], 69 y.o.   MRN: [id number]    Comprehensive Evaluation & Examination  This very nice 69 y.o. MBF presents for presents for a  comprehensive evaluation and management of multiple medical co-morbidities.  Patient has been followed for HTN, Prediabetes, Hyperlipidemia, and Vitamin D Deficiency. Patient reports being under increased stress recently dealing with a very critical spouse who has moved his daughter back in the house and he has become more indifferent to her and inconsiderate and critical of her. She reports frequent crying spells.     HTN predates circa 1990's. Patient's BP has been controlled at home and patient denies any cardiac symptoms as chest pain, palpitations, shortness of breath, dizziness or ankle swelling. Today's BP: 108/72 mmHg    Patient's hyperlipidemia is controlled with diet and medications. Patient denies myalgias or other medication SE's. Last lipids were at goal with Cholesterol 160; HDL 66; LDL 79; Triglycerides 76 on 06/07/2015.   Patient has prediabetes predating since May 2013 with A1c 5.9% and patient denies reactive hypoglycemic symptoms, visual blurring, diabetic polys, or paresthesias. Last A1c was 6.1% on 06/07/2015.   Finally, patient has history of Vitamin D Deficiency  Of 41 in 2014 and last Vitamin D was 56 on 06/07/2015.  Medication Sig  . aspirin 81 MG tablet Take 81 mg by mouth daily.  Marland Kitchen atenolol  100 MG tablet Take 1/2 to 1 tablet daily as directed for heart palpitations & BP  . CALTRATE 600+D PLUS  Take 1 tablet by mouth 2 (two) times daily.   . ferrous sulfate 325 (65 FE) MG tablet Take 325 mg by mouth daily with breakfast.  . meloxicam (MOBIC) 15 MG tablet Take one daily with food for 2 weeks, can take with tylenol, can not take with aleve, iburpofen, then as needed daily for pain  . omeprazole 20 MG capsule TAKE ONE CAPSULE BY MOUTH ONCE DAILY  .  Vitafushion v b12 500mg  Take 1 tablet by mouth  daily.  . Vitafushion omega - Take 1 tablet by mouth daily. 3  daily  . ranitidine (300 MG tablet Take 1 tablet daily with supper for indigestion and reflux  . simvastatin  20 MG tablet TAKE ONE TABLET BY MOUTH AT BEDTIME  . valsartan-hctz320-25 MG per tablet TAKE ONE TABLET BY MOUTH ONCE DAILY FOR  BLOOD  PRESSURE  . vitamin C  500 MG tablet Take 500 mg by mouth daily.   Allergies  Allergen Reactions  . Fosamax [Alendronate Sodium] Itching  . Sulfa Antibiotics Itching   Past Medical History  Diagnosis Date  . Hypertension   . Hyperlipidemia   . Pre-diabetes   . GERD (gastroesophageal reflux disease)   . RLS (restless legs syndrome)   . DJD (degenerative joint disease)    Health Maintenance  Topic Date Due  . Hepatitis C Screening  03-Oct-1946  . PNA vac Low Risk Adult (1 of 2 - PCV13) 08/20/2010  . COLONOSCOPY  11/17/2013  . INFLUENZA VACCINE  07/11/2016  . MAMMOGRAM  10/15/2016  . TETANUS/TDAP  07/11/2022  . DEXA SCAN  Completed  . ZOSTAVAX  Completed   Immunization History  Administered Date(s) Administered  . DTaP 12/11/2000  . Influenza Split 10/24/2013  . Influenza, High Dose Seasonal PF 09/17/2014, 09/09/2015  . Pneumococcal Polysaccharide-23 12/11/2002  . Tdap 07/11/2012  . Zoster 08/06/2013   Past Surgical History  Procedure Laterality Date  . Cholecystectomy    . Tubal ligation    .  Ankle surgery    . Left heart catheterization with coronary angiogram N/A 07/29/2014    Procedure: LEFT HEART CATHETERIZATION WITH CORONARY ANGIOGRAM;  Surgeon: Burnell Blanks, MD;  [facility] C[facility] LAB;  Service: Cardiovascular;  Laterality: N/A;   Family History  Problem Relation Age of Onset  . Heart disease Mother 40    stent  . Hypertension Mother   . Cancer Father   . Diabetes Father   . Hyperlipidemia Sister   . Hypertension Sister    Social History  Substance Use Topics  . Smoking status: Never Smoker   . Smokeless tobacco: None     Comment: SPOUSE  SMOKES  . Alcohol Use: No    ROS Constitutional: Denies fever, chills, weight loss/gain, headaches, insomnia,  night sweats, and change in appetite. Does c/o fatigue. Eyes: Denies redness, blurred vision, diplopia, discharge, itchy, watery eyes.  ENT: Denies discharge, congestion, post nasal drip, epistaxis, sore throat, earache, hearing loss, dental pain, Tinnitus, Vertigo, Sinus pain, snoring.  Cardio: Denies chest pain, palpitations, irregular heartbeat, syncope, dyspnea, diaphoresis, orthopnea, PND, claudication, edema Respiratory: denies cough, dyspnea, DOE, pleurisy, hoarseness, laryngitis, wheezing.  Gastrointestinal: Denies dysphagia, heartburn, reflux, water brash, pain, cramps, nausea, vomiting, bloating, diarrhea, constipation, hematemesis, melena, hematochezia, jaundice, hemorrhoids Genitourinary: Denies dysuria, frequency, urgency, nocturia, hesitancy, discharge, hematuria, flank pain Breast: Breast lumps, nipple discharge, bleeding.  Musculoskeletal: Denies arthralgia, myalgia, stiffness, Jt. Swelling, pain, limp, and strain/sprain. Denies falls. Skin: Denies puritis, rash, hives, warts, acne, eczema, changing in skin lesion Neuro: No weakness, tremor, incoordination, spasms, paresthesia, pain Psychiatric: Denies confusion, memory loss, sensory loss. Denies Depression. Endocrine: Denies change in weight, skin, hair change, nocturia, and paresthesia, diabetic polys, visual blurring, hyper / hypo glycemic episodes.  Heme/Lymph: No excessive bleeding, bruising, enlarged lymph nodes.  Physical Exam  BP 108/72 mmHg  Pulse 88  Temp(Src) 97.5 F (36.4 C)  Resp 16  Ht 5' 5.5" (1.664 m)  Wt 158 lb 9.6 oz (71.94 kg)  BMI 25.98 kg/m2  General Appearance: Well nourished and in no apparent distress. Eyes: PERRLA, EOMs, conjunctiva no swelling or erythema, normal fundi and vessels. Sinuses: No frontal/maxillary tenderness ENT/Mouth: EACs patent / TMs  nl. Nares clear without  erythema, swelling, mucoid exudates. Oral hygiene is good. No erythema, swelling, or exudate. Tongue normal, non-obstructing. Tonsils not swollen or erythematous. Hearing normal.  Neck: Supple, thyroid normal. No bruits, nodes or JVD. Respiratory: Respiratory effort normal.  BS equal and clear bilateral without rales, rhonci, wheezing or stridor. Cardio: Heart sounds are normal with regular rate and rhythm and no murmurs, rubs or gallops. Peripheral pulses are normal and equal bilaterally without edema. No aortic or femoral bruits. Chest: symmetric with normal excursions and percussion. Breasts: Symmetric, without lumps, nipple discharge, retractions, or fibrocystic changes.  Abdomen: Flat, soft, with bowl sounds. Nontender, no guarding, rebound, hernias, masses, or organomegaly.  Lymphatics: Non tender without lymphadenopathy.  Genitourinary:  Musculoskeletal: Full ROM all peripheral extremities, joint stability, 5/5 strength, and normal gait. Skin: Warm and dry without rashes, lesions, cyanosis, clubbing or  ecchymosis.  Neuro: Cranial nerves intact, reflexes equal bilaterally. Normal muscle tone, no cerebellar symptoms. Sensation intact.  Pysch: Awake and oriented X 3, normal affect, Insight and Judgment appropriate.   Assessment and Plan  1. Essential hypertension  - Microalbumin / creatinine urine ratio - EKG 12-Lead - [place], RETROPERITNL ABD,  LTD - TSH  2. Mixed hyperlipidemia  - Lipid panel  3. Prediabetes  - Hemoglobin A1c - Insulin, random  4.  Vitamin D deficiency  - VITAMIN D 25 Hydroxy   5. Gastroesophageal reflux disease   6. Screening for rectal cancer  - POC Hemoccult Bld/Stl   7. Depression screen   8. Situational Depression  - Rx - Citalopram 20 mg #90 x 1 rf - Rx - Alprazolam 1 mg #30 x2 rf - 1/2-1 qhs prn - f/u in 6 weeks   9. At low risk for fall   10. BMI 26.0-26.9,adult   11. Medication management  - Urinalysis, Routine w reflex  microscopic  - CBC with Differential/Platelet - BASIC METABOLIC PANEL WITH GFR - Hepatic function panel - Magnesium   Continue prudent diet as discussed, weight control, BP monitoring, regular exercise, and medications. Discussed med's effects and SE's. Screening labs and tests as requested with regular follow-up as recommended.

## 2015-10-30 LAB — URINALYSIS, ROUTINE W REFLEX MICROSCOPIC
BILIRUBIN URINE: NEGATIVE
GLUCOSE, UA: NEGATIVE
HGB URINE DIPSTICK: NEGATIVE
Ketones, ur: NEGATIVE
Leukocytes, UA: NEGATIVE
Nitrite: NEGATIVE
PH: 7 (ref 5.0–8.0)
PROTEIN: NEGATIVE
Specific Gravity, Urine: 1.017 (ref 1.001–1.035)

## 2015-10-30 LAB — MICROALBUMIN / CREATININE URINE RATIO
Creatinine, Urine: 156 mg/dL (ref 20–320)
MICROALB UR: 1.1 mg/dL
MICROALB/CREAT RATIO: 7 ug/mg{creat} (ref <30)

## 2015-10-30 LAB — INSULIN, RANDOM: INSULIN: 8.8 u[IU]/mL (ref 2.0–19.6)

## 2015-10-30 LAB — VITAMIN D 25 HYDROXY (VIT D DEFICIENCY, FRACTURES): Vit D, 25-Hydroxy: 50 ng/mL (ref 30–100)

## 2015-12-09 ENCOUNTER — Ambulatory Visit: Payer: Self-pay | Admitting: Internal Medicine

## 2015-12-23 ENCOUNTER — Ambulatory Visit: Payer: Self-pay | Admitting: Internal Medicine

## 2015-12-28 ENCOUNTER — Other Ambulatory Visit (HOSPITAL_COMMUNITY): Payer: Self-pay | Admitting: Orthopaedic Surgery

## 2016-01-24 ENCOUNTER — Inpatient Hospital Stay (HOSPITAL_COMMUNITY): Admission: RE | Admit: 2016-01-24 | Payer: Medicare PPO | Source: Ambulatory Visit | Admitting: Orthopaedic Surgery

## 2016-01-24 ENCOUNTER — Encounter (HOSPITAL_COMMUNITY): Admission: RE | Payer: Self-pay | Source: Ambulatory Visit

## 2016-01-24 SURGERY — ARTHROPLASTY, KNEE, TOTAL, USING IMAGELESS COMPUTER-ASSISTED NAVIGATION
Anesthesia: General | Laterality: Right

## 2016-02-10 ENCOUNTER — Ambulatory Visit: Payer: Self-pay | Admitting: Internal Medicine

## 2016-02-29 ENCOUNTER — Ambulatory Visit (INDEPENDENT_AMBULATORY_CARE_PROVIDER_SITE_OTHER): Payer: Medicare Other | Admitting: Internal Medicine

## 2016-02-29 ENCOUNTER — Encounter: Payer: Self-pay | Admitting: Internal Medicine

## 2016-02-29 VITALS — BP 112/60 | HR 98 | Temp 97.8°F | Resp 16 | Ht 65.5 in | Wt 145.0 lb

## 2016-02-29 DIAGNOSIS — E559 Vitamin D deficiency, unspecified: Secondary | ICD-10-CM | POA: Diagnosis not present

## 2016-02-29 DIAGNOSIS — I10 Essential (primary) hypertension: Secondary | ICD-10-CM

## 2016-02-29 DIAGNOSIS — F4329 Adjustment disorder with other symptoms: Secondary | ICD-10-CM

## 2016-02-29 DIAGNOSIS — F4321 Adjustment disorder with depressed mood: Secondary | ICD-10-CM

## 2016-02-29 DIAGNOSIS — E782 Mixed hyperlipidemia: Secondary | ICD-10-CM | POA: Diagnosis not present

## 2016-02-29 DIAGNOSIS — R7303 Prediabetes: Secondary | ICD-10-CM

## 2016-02-29 DIAGNOSIS — Z79899 Other long term (current) drug therapy: Secondary | ICD-10-CM

## 2016-02-29 DIAGNOSIS — Z634 Disappearance and death of family member: Secondary | ICD-10-CM

## 2016-02-29 MED ORDER — ALPRAZOLAM 0.5 MG PO TABS: 0.5000 mg | ORAL_TABLET | Freq: Three times a day (TID) | ORAL | Status: DC | PRN

## 2016-02-29 MED ORDER — SERTRALINE HCL 50 MG PO TABS: 50.0000 mg | ORAL_TABLET | Freq: Every day | ORAL | Status: DC

## 2016-02-29 NOTE — Progress Notes (Signed)
Assessment and Plan:  Hypertension:  -Continue medication,  -monitor blood pressure at home.  -Continue DASH diet.   -Reminder to go to the ER if any CP, SOB, nausea, dizziness, severe HA, changes vision/speech, left arm numbness and tingling, and jaw pain.  Cholesterol: -Continue diet and exercise.    Pre-diabetes: -Continue diet and exercise.   Vitamin D Def: -continue medications.  Complicated grieving and anxiety -start xanax prn for panic attacks and sleep -zoloft -recheck in 1 month   Continue diet and meds as discussed. Further disposition pending results of labs.  HPI 70 y.o. female  presents for 3 month follow up with hypertension, hyperlipidemia, prediabetes and vitamin D.   Her blood pressure has been controlled at home, today their BP is BP: 112/60 mmHg.   She does workout. She denies chest pain, shortness of breath, dizziness.   She is on cholesterol medication and denies myalgias. Her cholesterol is at goal. The cholesterol last visit was:   Lab Results  Component Value Date   CHOL 150 10/29/2015   HDL 64 10/29/2015   LDLCALC 73 10/29/2015   TRIG 66 10/29/2015   CHOLHDL 2.3 10/29/2015     She has been working on diet and exercise for prediabetes, and denies foot ulcerations, hyperglycemia, hypoglycemia , increased appetite, nausea, paresthesia of the feet, polydipsia, polyuria, visual disturbances, vomiting and weight loss. Last A1C in the office was:  Lab Results  Component Value Date   HGBA1C 6.1* 10/29/2015    Patient is on Vitamin D supplement.  Lab Results  Component Value Date   VD25OH 46 10/29/2015     She reports that since her last visit her husband passed away and it is causing her to have a lot of anxiety and also to have a lot of issues with sleeping.  She is also very angry because her husband gave the house that they live in to his daughters and gave them most of their savings and she was left with very little to nothing.  She also  reports that his daughters are being very ugly to her.  She is having some stress and panic attacks.     Current Medications:  Current Outpatient Prescriptions on File Prior to Visit  Medication Sig Dispense Refill  . aspirin 81 MG tablet Take 81 mg by mouth daily.    Marland Kitchen atenolol (TENORMIN) 100 MG tablet Take 1/2 to 1 tablet daily as directed for heart palpitations & BP 90 tablet 99  . Calcium Carbonate-Vit D-Min (CALTRATE 600+D PLUS PO) Take 1 tablet by mouth 2 (two) times daily.     . ferrous sulfate 325 (65 FE) MG tablet Take 325 mg by mouth daily with breakfast.    . meloxicam (MOBIC) 15 MG tablet Take one daily with food for 2 weeks, can take with tylenol, can not take with aleve, iburpofen, then as needed daily for pain 30 tablet 1  . omeprazole (PRILOSEC) 20 MG capsule TAKE ONE CAPSULE BY MOUTH ONCE DAILY 90 capsule 0  . OVER THE COUNTER MEDICATION Take 1 tablet by mouth daily. Vitafushion v b12 500mg     . OVER THE COUNTER MEDICATION Take 1 tablet by mouth daily. Vitafushion omega -3  daily    . ranitidine (ZANTAC) 300 MG tablet Take 1 tablet daily with supper for indigestion and reflux 90 tablet 99  . simvastatin (ZOCOR) 20 MG tablet TAKE ONE TABLET BY MOUTH AT BEDTIME 90 tablet 1  . valsartan-hydrochlorothiazide (DIOVAN-HCT) 320-25 MG per tablet TAKE ONE  TABLET BY MOUTH ONCE DAILY FOR  BLOOD  PRESSURE 90 tablet 1  . vitamin C (ASCORBIC ACID) 500 MG tablet Take 500 mg by mouth daily.     No current facility-administered medications on file prior to visit.    Medical History:  Past Medical History  Diagnosis Date  . Hypertension   . Hyperlipidemia   . Pre-diabetes   . GERD (gastroesophageal reflux disease)   . RLS (restless legs syndrome)   . DJD (degenerative joint disease)     Allergies:  Allergies  Allergen Reactions  . Fosamax [Alendronate Sodium] Itching  . Sulfa Antibiotics Itching     Review of Systems:  Review of Systems  Cardiovascular: Negative for chest  pain, palpitations and leg swelling.  Gastrointestinal: Negative for heartburn, diarrhea, constipation, blood in stool and melena.  Genitourinary: Negative.   Neurological: Negative for dizziness, sensory change and loss of consciousness.  Psychiatric/Behavioral: Positive for depression. The patient is nervous/anxious and has insomnia.     Family history- Review and unchanged  Social history- Review and unchanged  Physical Exam: BP 112/60 mmHg  Pulse 98  Temp(Src) 97.8 F (36.6 C) (Temporal)  Resp 16  Ht 5' 5.5" (1.664 m)  Wt 145 lb (65.772 kg)  BMI 23.75 kg/m2 Wt Readings from Last 3 Encounters:  02/29/16 145 lb (65.772 kg)  10/29/15 158 lb 9.6 oz (71.94 kg)  06/07/15 165 lb (74.844 kg)    General Appearance: Well nourished well developed, in no apparent distress. Eyes: PERRLA, EOMs, conjunctiva no swelling or erythema ENT/Mouth: Ear canals normal without obstruction, swelling, erythma, discharge.  TMs normal bilaterally.  Oropharynx moist, clear, without exudate, or postoropharyngeal swelling. Neck: Supple, thyroid normal,no cervical adenopathy  Respiratory: Respiratory effort normal, Breath sounds clear A&P without rhonchi, wheeze, or rale.  No retractions, no accessory usage. Cardio: RRR with no MRGs. Brisk peripheral pulses without edema.  Abdomen: Soft, + BS,  Non tender, no guarding, rebound, hernias, masses. Musculoskeletal: Full ROM, 5/5 strength, Normal gait Skin: Warm, dry without rashes, lesions, ecchymosis.  Neuro: Awake and oriented X 3, Cranial nerves intact. Normal muscle tone, no cerebellar symptoms. Psych: Normal affect, Insight and Judgment appropriate.    Starlyn Skeans, PA-C 3:37 PM [facility]

## 2016-02-29 NOTE — Patient Instructions (Signed)
Please take 1/2 tablet of zoloft daily with a small meal.  If you do well with this you can increase to a full tablet daily after that.  Please take the alprazolam for sleep or panic during the day time as needed.  Please look into going to the hospice.    [facility] Address: [address], [place], [place] [address]  Phone: [phone]

## 2016-03-16 ENCOUNTER — Encounter: Payer: Self-pay | Admitting: Internal Medicine

## 2016-03-16 ENCOUNTER — Ambulatory Visit (INDEPENDENT_AMBULATORY_CARE_PROVIDER_SITE_OTHER): Payer: Medicare Other | Admitting: Internal Medicine

## 2016-03-16 ENCOUNTER — Ambulatory Visit (HOSPITAL_COMMUNITY)
Admission: RE | Admit: 2016-03-16 | Discharge: 2016-03-16 | Disposition: A | Discharge status: Home / Self Care | Payer: Medicare Other | Source: Ambulatory Visit | Attending: Internal Medicine | Admitting: Internal Medicine

## 2016-03-16 VITALS — BP 110/60 | HR 86 | Temp 98.0°F | Resp 16 | Ht 65.5 in | Wt 140.0 lb

## 2016-03-16 DIAGNOSIS — R1012 Left upper quadrant pain: Secondary | ICD-10-CM | POA: Diagnosis not present

## 2016-03-16 DIAGNOSIS — K5900 Constipation, unspecified: Secondary | ICD-10-CM | POA: Insufficient documentation

## 2016-03-16 DIAGNOSIS — Z9049 Acquired absence of other specified parts of digestive tract: Secondary | ICD-10-CM | POA: Insufficient documentation

## 2016-03-16 MED ORDER — OMEPRAZOLE 40 MG PO CPDR: 40.0000 mg | DELAYED_RELEASE_CAPSULE | Freq: Every day | ORAL | Status: DC

## 2016-03-16 MED ORDER — RANITIDINE HCL 300 MG PO TABS: ORAL_TABLET | ORAL | Status: DC

## 2016-03-16 MED ORDER — OMEPRAZOLE 20 MG PO CPDR: 20.0000 mg | DELAYED_RELEASE_CAPSULE | Freq: Every day | ORAL | Status: DC

## 2016-03-16 NOTE — Progress Notes (Signed)
Subjective:    Patient ID: Emily Jenkins, female    DOB: [birth year], 70 y.o.   MRN: [id number]  Abdominal Pain Associated symptoms include constipation and frequency. Pertinent negatives include no diarrhea, dysuria, fever, hematuria, nausea or vomiting.   Patient presents to the office for evaluation of left sided pain which has been going on for the past couple months.  This pain is intermittent and is an aching type of pain and sometimes radiates down to her back.  She reports that she feels like urinating a lot at night time.  She has a lot of urinary frequency without urgency.  She does urinate a fair amount when she goes.  NO foul smell to urine.  No pain with urinating.  She reports that sometimes when she eats the pain gets a little bit worse.  She does not have any pain when she lays down.  Pain never wakes her up from sleeping.  It only happens during the day.  She does tend to get tingling and numbness in her hands.  No history of this type of pain.  No injury that she can think of.  She does feel like she is having some more reflux lately and she is having some water brash. She is also constipated.  She did take a laxative and had a large BM on Monday.     Review of Systems  Constitutional: Negative for fever, chills and fatigue.  Respiratory: Negative for chest tightness and shortness of breath.   Cardiovascular: Negative for chest pain and palpitations.  Gastrointestinal: Positive for abdominal pain and constipation. Negative for nausea, vomiting, diarrhea, blood in stool and anal bleeding.  Genitourinary: Positive for frequency. Negative for dysuria, urgency, hematuria, flank pain and difficulty urinating.  Neurological: Negative for dizziness, speech difficulty, weakness and light-headedness.       Objective:   Physical Exam  Constitutional: She is oriented to person, place, and time. She appears well-developed and well-nourished. No distress.  HENT:  Head:  Normocephalic.  Mouth/Throat: Oropharynx is clear and moist. No oropharyngeal exudate.  Eyes: Conjunctivae are normal. No scleral icterus.  Neck: Normal range of motion. Neck supple. No JVD present. No thyromegaly present.  Cardiovascular: Normal rate, regular rhythm, normal heart sounds and intact distal pulses.  Exam reveals no gallop and no friction rub.   No murmur heard. Pulmonary/Chest: Effort normal and breath sounds normal. No respiratory distress. She has no wheezes. She has no rales. She exhibits no tenderness.  Abdominal: Soft. Normal appearance and bowel sounds are normal. She exhibits no distension and no mass. There is tenderness in the epigastric area and left upper quadrant. There is no rigidity, no rebound, no guarding, no CVA tenderness, no tenderness at McBurney's point and negative Murphy's sign.  Musculoskeletal: Normal range of motion.  Lymphadenopathy:    She has no cervical adenopathy.  Neurological: She is alert and oriented to person, place, and time.  Skin: Skin is warm and dry. She is not diaphoretic.  Psychiatric: She has a normal mood and affect. Her behavior is normal. Judgment and thought content normal.  Nursing note and vitals reviewed.   Filed Vitals:   03/16/16 1011  BP: 110/60  Pulse: 86  Temp: 98 F (36.7 C)  Resp: 16   Wt Readings from Last 3 Encounters:  03/16/16 140 lb (63.504 kg)  02/29/16 145 lb (65.772 kg)  10/29/15 158 lb 9.6 oz (71.94 kg)      Assessment & Plan:   1. LUQ  abdominal pain -increase prilosec to 40 mg instead of 20 mg -start zantac daily -over due for colonoscopy -rapid weight loss of 18 lbs, but cloudy with recent death of husband.  Patient reports that she does not have an appetite.   -possibly due to some constipation.    - ranitidine (ZANTAC) 300 MG tablet; Take 1 tablet daily with supper for indigestion and reflux  Dispense: 90 tablet; Refill: 99 - omeprazole (PRILOSEC) 40 MG capsule; Take 1 capsule (40 mg total)  by mouth daily.  Dispense: 90 capsule; Refill: 1 - DG Abd 2 Views; Future - Urinalysis, Routine w reflex microscopic (not at [facility]) - Culture, Urine - Ambulatory referral to Gastroenterology

## 2016-03-16 NOTE — Patient Instructions (Signed)
Gastritis, Adult Gastritis is soreness and swelling (inflammation) of the lining of the stomach. Gastritis can develop as a sudden onset (acute) or long-term (chronic) condition. If gastritis is not treated, it can lead to stomach bleeding and ulcers. CAUSES  Gastritis occurs when the stomach lining is weak or damaged. Digestive juices from the stomach then inflame the weakened stomach lining. The stomach lining may be weak or damaged due to viral or bacterial infections. One common bacterial infection is the Helicobacter pylori infection. Gastritis can also result from excessive alcohol consumption, taking certain medicines, or having too much acid in the stomach.  SYMPTOMS  In some cases, there are no symptoms. When symptoms are present, they may include:  Pain or a burning sensation in the upper abdomen.  Nausea.  Vomiting.  An uncomfortable feeling of fullness after eating. DIAGNOSIS  Your caregiver may suspect you have gastritis based on your symptoms and a physical exam. To determine the cause of your gastritis, your caregiver may perform the following:  Blood or stool tests to check for the H pylori bacterium.  Gastroscopy. A thin, flexible tube (endoscope) is passed down the esophagus and into the stomach. The endoscope has a light and camera on the end. Your caregiver uses the endoscope to view the inside of the stomach.  Taking a tissue sample (biopsy) from the stomach to examine under a microscope. TREATMENT  Depending on the cause of your gastritis, medicines may be prescribed. If you have a bacterial infection, such as an H pylori infection, antibiotics may be given. If your gastritis is caused by too much acid in the stomach, H2 blockers or antacids may be given. Your caregiver may recommend that you stop taking aspirin, ibuprofen, or other nonsteroidal anti-inflammatory drugs (NSAIDs). HOME CARE INSTRUCTIONS  Only take over-the-counter or prescription medicines as directed by  your caregiver.  If you were given antibiotic medicines, take them as directed. Finish them even if you start to feel better.  Drink enough fluids to keep your urine clear or pale yellow.  Avoid foods and drinks that make your symptoms worse, such as:  Caffeine or alcoholic drinks.  Chocolate.  Peppermint or mint flavorings.  Garlic and onions.  Spicy foods.  Citrus fruits, such as oranges, lemons, or limes.  Tomato-based foods such as sauce, chili, salsa, and pizza.  Fried and fatty foods.  Eat small, frequent meals instead of large meals. SEEK IMMEDIATE MEDICAL CARE IF:   You have black or dark red stools.  You vomit blood or material that looks like coffee grounds.  You are unable to keep fluids down.  Your abdominal pain gets worse.  You have a fever.  You do not feel better after 1 week.  You have any other questions or concerns. MAKE SURE YOU:  Understand these instructions.  Will watch your condition.  Will get help right away if you are not doing well or get worse.   This information is not intended to replace advice given to you by your health care provider. Make sure you discuss any questions you have with your health care provider.   Document Released: 11/21/2001 Document Revised: 05/28/2012 Document Reviewed: 01/10/2012 Elsevier Interactive Patient Education 2016 [place] for Gastroesophageal Reflux Disease, Adult When you have gastroesophageal reflux disease (GERD), the foods you eat and your eating habits are very important. Choosing the right foods can help ease the discomfort of GERD. WHAT GENERAL GUIDELINES DO I NEED TO FOLLOW?  Choose fruits, vegetables, whole  grains, low-fat dairy products, and low-fat meat, fish, and poultry.  Limit fats such as oils, salad dressings, butter, nuts, and avocado.  Keep a food diary to identify foods that cause symptoms.  Avoid foods that cause reflux. These may be different for  different people.  Eat frequent small meals instead of three large meals each day.  Eat your meals slowly, in a relaxed setting.  Limit fried foods.  Cook foods using methods other than frying.  Avoid drinking alcohol.  Avoid drinking large amounts of liquids with your meals.  Avoid bending over or lying down until 2-3 hours after eating. WHAT FOODS ARE NOT RECOMMENDED? The following are some foods and drinks that may worsen your symptoms: Vegetables Tomatoes. Tomato juice. Tomato and spaghetti sauce. Chili peppers. Onion and garlic. Horseradish. Fruits Oranges, grapefruit, and lemon (fruit and juice). Meats High-fat meats, fish, and poultry. This includes hot dogs, ribs, ham, sausage, salami, and bacon. Dairy Whole milk and chocolate milk. Sour cream. Cream. Butter. Ice cream. Cream cheese.  Beverages Coffee and tea, with or without caffeine. Carbonated beverages or energy drinks. Condiments Hot sauce. Barbecue sauce.  Sweets/Desserts Chocolate and cocoa. Donuts. Peppermint and spearmint. Fats and Oils High-fat foods, including [place] fries and potato chips. Other Vinegar. Strong spices, such as black pepper, white pepper, red pepper, cayenne, curry powder, cloves, ginger, and chili powder. The items listed above may not be a complete list of foods and beverages to avoid. Contact your dietitian for more information.   This information is not intended to replace advice given to you by your health care provider. Make sure you discuss any questions you have with your health care provider.   Document Released: 11/27/2005 Document Revised: 12/18/2014 Document Reviewed: 10/01/2013 Elsevier Interactive Patient Education [employer].

## 2016-03-17 ENCOUNTER — Other Ambulatory Visit: Payer: Self-pay | Admitting: Internal Medicine

## 2016-03-17 DIAGNOSIS — R1012 Left upper quadrant pain: Secondary | ICD-10-CM

## 2016-03-17 DIAGNOSIS — R319 Hematuria, unspecified: Secondary | ICD-10-CM

## 2016-03-17 LAB — URINALYSIS, MICROSCOPIC ONLY
Bacteria, UA: NONE SEEN [HPF]
CRYSTALS: NONE SEEN [HPF]
Casts: NONE SEEN [LPF]
Squamous Epithelial / LPF: NONE SEEN [HPF] (ref <=5)
WBC, UA: NONE SEEN WBC/HPF (ref <=5)
Yeast: NONE SEEN [HPF]

## 2016-03-17 LAB — URINALYSIS, ROUTINE W REFLEX MICROSCOPIC
Bilirubin Urine: NEGATIVE
Glucose, UA: NEGATIVE
HGB URINE DIPSTICK: NEGATIVE
KETONES UR: NEGATIVE
NITRITE: NEGATIVE
Protein, ur: NEGATIVE
SPECIFIC GRAVITY, URINE: 1.016 (ref 1.001–1.035)
pH: 5.5 (ref 5.0–8.0)

## 2016-03-17 LAB — URINE CULTURE

## 2016-03-24 ENCOUNTER — Ambulatory Visit
Admission: RE | Admit: 2016-03-24 | Discharge: 2016-03-24 | Disposition: A | Discharge status: Home / Self Care | Payer: Medicare Other | Source: Ambulatory Visit | Attending: Internal Medicine | Admitting: Internal Medicine

## 2016-03-24 DIAGNOSIS — R319 Hematuria, unspecified: Secondary | ICD-10-CM

## 2016-03-24 DIAGNOSIS — R1012 Left upper quadrant pain: Secondary | ICD-10-CM

## 2016-04-03 ENCOUNTER — Ambulatory Visit (INDEPENDENT_AMBULATORY_CARE_PROVIDER_SITE_OTHER): Payer: Medicare Other | Admitting: Internal Medicine

## 2016-04-03 ENCOUNTER — Encounter: Payer: Self-pay | Admitting: Internal Medicine

## 2016-04-03 VITALS — BP 122/60 | HR 78 | Temp 98.4°F | Resp 16 | Ht 65.5 in | Wt 142.0 lb

## 2016-04-03 DIAGNOSIS — R319 Hematuria, unspecified: Secondary | ICD-10-CM

## 2016-04-03 DIAGNOSIS — F4323 Adjustment disorder with mixed anxiety and depressed mood: Secondary | ICD-10-CM | POA: Diagnosis not present

## 2016-04-03 NOTE — Progress Notes (Signed)
Assessment and Plan:   1. Hematuria  - Urinalysis, Routine w reflex microscopic (not at [facility]) - Culture, Urine  2. Adjustment disorder with mixed anxiety and depressed mood -increase zoloft to 100 mg -cont xanax prn  HPI 70 y.o.female presents for 1 month follow up of abdominal pain and constipation.  At last office visit patient did have hematuria.  CT scan was done to look for possible renal stones and there were no acute stones. Patient reports that they have been doing a little better.  She reports that she has been continuing to have some pain in the left side and also in the LLQ.  She reports that she has had mild improvement of constipation.  She is still having a lot of gas.  She reports that this is mildly improved.  She also reports that she is still mildly nervous.    Past Medical History  Diagnosis Date  . Hypertension   . Hyperlipidemia   . Pre-diabetes   . GERD (gastroesophageal reflux disease)   . RLS (restless legs syndrome)   . DJD (degenerative joint disease)      Allergies  Allergen Reactions  . Fosamax [Alendronate Sodium] Itching  . Sulfa Antibiotics Itching      Current Outpatient Prescriptions on File Prior to Visit  Medication Sig Dispense Refill  . ALPRAZolam (XANAX) 0.5 MG tablet Take 1 tablet (0.5 mg total) by mouth 3 (three) times daily as needed for sleep or anxiety. 90 tablet 0  . aspirin 81 MG tablet Take 81 mg by mouth daily.    Marland Kitchen atenolol (TENORMIN) 100 MG tablet Take 1/2 to 1 tablet daily as directed for heart palpitations & BP 90 tablet 99  . Calcium Carbonate-Vit D-Min (CALTRATE 600+D PLUS PO) Take 1 tablet by mouth 2 (two) times daily.     . ferrous sulfate 325 (65 FE) MG tablet Take 325 mg by mouth daily with breakfast.    . meloxicam (MOBIC) 15 MG tablet Take one daily with food for 2 weeks, can take with tylenol, can not take with aleve, iburpofen, then as needed daily for pain 30 tablet 1  . omeprazole (PRILOSEC) 40 MG capsule Take 1  capsule (40 mg total) by mouth daily. 90 capsule 1  . OVER THE COUNTER MEDICATION Take 1 tablet by mouth daily. Vitafushion v b12 500mg     . OVER THE COUNTER MEDICATION Take 1 tablet by mouth daily. Vitafushion omega -3  daily    . ranitidine (ZANTAC) 300 MG tablet Take 1 tablet daily with supper for indigestion and reflux 90 tablet 99  . sertraline (ZOLOFT) 50 MG tablet Take 1 tablet (50 mg total) by mouth daily. 90 tablet 2  . simvastatin (ZOCOR) 20 MG tablet TAKE ONE TABLET BY MOUTH AT BEDTIME 90 tablet 1  . valsartan-hydrochlorothiazide (DIOVAN-HCT) 320-25 MG per tablet TAKE ONE TABLET BY MOUTH ONCE DAILY FOR  BLOOD  PRESSURE 90 tablet 1  . vitamin C (ASCORBIC ACID) 500 MG tablet Take 500 mg by mouth daily.     No current facility-administered medications on file prior to visit.    ROS: all negative except above.   Physical Exam: Filed Weights   04/03/16 1101  Weight: 142 lb (64.411 kg)   BP 122/60 mmHg  Pulse 78  Temp(Src) 98.4 F (36.9 C) (Temporal)  Resp 16  Ht 5' 5.5" (1.664 m)  Wt 142 lb (64.411 kg)  BMI 23.26 kg/m2 General Appearance: Well developed well nourished, non-toxic appearing in no apparent distress.  Eyes: PERRLA, EOMs, conjunctiva w/ no swelling or erythema or discharge Sinuses: No Frontal/maxillary tenderness ENT/Mouth: Ear canals clear without swelling or erythema.  TM's normal bilaterally with no retractions, bulging, or loss of landmarks.   Neck: Supple, thyroid normal, no notable JVD  Respiratory: Respiratory effort normal, Clear breath sounds anteriorly and posteriorly bilaterally without rales, rhonchi, wheezing or stridor. No retractions or accessory muscle usage. Cardio: RRR with no MRGs.   Abdomen: Soft, + BS.  Non tender, no guarding, rebound, hernias, masses.  Musculoskeletal: Full ROM, 5/5 strength, normal gait.  Skin: Warm, dry without rashes  Neuro: Awake and oriented X 3, Cranial nerves intact. Normal muscle tone, no cerebellar symptoms.  Sensation intact.  Psych: normal affect, Insight and Judgment appropriate.     Starlyn Skeans, PA-C 11:11 AM [facility]

## 2016-04-03 NOTE — Patient Instructions (Addendum)
Please increase the zoloft to 100 mg daily.  Use two tablets of the 50 mg zoloft or sertraline until you run out. This is to help with your nerves.    Please take the xanax if you feel like the nerves are really starting to get worse.  The xanax is your rescue medication for when your nerves get really bad.   Continue the zantac and the prilosec currently.  These are your acid reflux medications  Please go see one of the counselors at hospice to help talk through some of the issues with your nerves.

## 2016-04-04 LAB — URINALYSIS, ROUTINE W REFLEX MICROSCOPIC
BILIRUBIN URINE: NEGATIVE
Glucose, UA: NEGATIVE
HGB URINE DIPSTICK: NEGATIVE
Ketones, ur: NEGATIVE
LEUKOCYTES UA: NEGATIVE
Nitrite: NEGATIVE
PROTEIN: NEGATIVE
Specific Gravity, Urine: 1.016 (ref 1.001–1.035)
pH: 5 (ref 5.0–8.0)

## 2016-04-04 LAB — URINE CULTURE: Colony Count: 1000

## 2016-04-11 ENCOUNTER — Other Ambulatory Visit: Payer: Self-pay | Admitting: *Deleted

## 2016-04-11 MED ORDER — TRAMADOL HCL 50 MG PO TABS: 50.0000 mg | ORAL_TABLET | Freq: Three times a day (TID) | ORAL | Status: DC | PRN

## 2016-04-28 ENCOUNTER — Other Ambulatory Visit: Payer: Self-pay | Admitting: Internal Medicine

## 2016-05-16 ENCOUNTER — Other Ambulatory Visit: Payer: Self-pay | Admitting: *Deleted

## 2016-05-16 ENCOUNTER — Encounter: Payer: Self-pay | Admitting: Internal Medicine

## 2016-05-16 ENCOUNTER — Ambulatory Visit (INDEPENDENT_AMBULATORY_CARE_PROVIDER_SITE_OTHER): Payer: Medicare Other | Admitting: Internal Medicine

## 2016-05-16 VITALS — BP 124/72 | HR 76 | Temp 97.3°F | Resp 16 | Ht 65.5 in | Wt 146.8 lb

## 2016-05-16 DIAGNOSIS — E782 Mixed hyperlipidemia: Secondary | ICD-10-CM | POA: Diagnosis not present

## 2016-05-16 DIAGNOSIS — M5441 Lumbago with sciatica, right side: Secondary | ICD-10-CM

## 2016-05-16 DIAGNOSIS — Z79899 Other long term (current) drug therapy: Secondary | ICD-10-CM | POA: Diagnosis not present

## 2016-05-16 DIAGNOSIS — I10 Essential (primary) hypertension: Secondary | ICD-10-CM

## 2016-05-16 DIAGNOSIS — E559 Vitamin D deficiency, unspecified: Secondary | ICD-10-CM

## 2016-05-16 DIAGNOSIS — R300 Dysuria: Secondary | ICD-10-CM

## 2016-05-16 DIAGNOSIS — R7303 Prediabetes: Secondary | ICD-10-CM

## 2016-05-16 LAB — CBC WITH DIFFERENTIAL/PLATELET
BASOS ABS: 42 {cells}/uL (ref 0–200)
BASOS PCT: 1 %
EOS ABS: 42 {cells}/uL (ref 15–500)
Eosinophils Relative: 1 %
HCT: 36.2 % (ref 35.0–45.0)
HEMOGLOBIN: 11.9 g/dL (ref 11.7–15.5)
LYMPHS ABS: 1008 {cells}/uL (ref 850–3900)
Lymphocytes Relative: 24 %
MCH: 27 pg (ref 27.0–33.0)
MCHC: 32.9 g/dL (ref 32.0–36.0)
MCV: 82.3 fL (ref 80.0–100.0)
MONO ABS: 252 {cells}/uL (ref 200–950)
MPV: 10.9 fL (ref 7.5–12.5)
Monocytes Relative: 6 %
NEUTROS ABS: 2856 {cells}/uL (ref 1500–7800)
Neutrophils Relative %: 68 %
PLATELETS: 249 10*3/uL (ref 140–400)
RBC: 4.4 MIL/uL (ref 3.80–5.10)
RDW: 14.9 % (ref 11.0–15.0)
WBC: 4.2 10*3/uL (ref 3.8–10.8)

## 2016-05-16 LAB — LIPID PANEL
Cholesterol: 176 mg/dL (ref 125–200)
HDL: 78 mg/dL (ref >=46)
LDL CALC: 85 mg/dL (ref <130)
Total CHOL/HDL Ratio: 2.3 Ratio (ref <=5.0)
Triglycerides: 64 mg/dL (ref <150)
VLDL: 13 mg/dL (ref <30)

## 2016-05-16 LAB — BASIC METABOLIC PANEL WITH GFR
BUN: 13 mg/dL (ref 7–25)
CHLORIDE: 101 mmol/L (ref 98–110)
CO2: 29 mmol/L (ref 20–31)
CREATININE: 0.75 mg/dL (ref 0.60–0.93)
Calcium: 10 mg/dL (ref 8.6–10.4)
GFR, Est African American: >89 mL/min (ref >=60)
GFR, Est Non African American: 81 mL/min (ref >=60)
Glucose, Bld: 79 mg/dL (ref 65–99)
POTASSIUM: 3.8 mmol/L (ref 3.5–5.3)
Sodium: 142 mmol/L (ref 135–146)

## 2016-05-16 LAB — HEPATIC FUNCTION PANEL
ALBUMIN: 4.2 g/dL (ref 3.6–5.1)
ALK PHOS: 80 U/L (ref 33–130)
ALT: 8 U/L (ref 6–29)
AST: 16 U/L (ref 10–35)
BILIRUBIN DIRECT: 0.1 mg/dL (ref <=0.2)
BILIRUBIN TOTAL: 0.3 mg/dL (ref 0.2–1.2)
Indirect Bilirubin: 0.2 mg/dL (ref 0.2–1.2)
Total Protein: 6.7 g/dL (ref 6.1–8.1)

## 2016-05-16 LAB — MAGNESIUM: MAGNESIUM: 1.9 mg/dL (ref 1.5–2.5)

## 2016-05-16 MED ORDER — PREDNISONE 20 MG PO TABS
ORAL_TABLET | ORAL | Status: DC
Start: 2016-05-16 — End: 2016-08-21

## 2016-05-16 MED ORDER — SIMVASTATIN 20 MG PO TABS: 20.0000 mg | ORAL_TABLET | Freq: Every day | ORAL | Status: DC

## 2016-05-16 NOTE — Progress Notes (Signed)
Patient ID: Emily Jenkins, female   DOB: 02-16-1946, 70 y.o.   MRN: [id number]  [facility]                       Unk Pinto, M.D.        Uvaldo Bristle. Silverio Lay, P.A.-C       Starlyn Skeans, P.A.-C   [facility]                [address] [place], [place] [id number] Telephone [phone] Telefax [phone] _________________________________________________________________________   This very nice 70 y.o. [name] presents for 6 month follow up with Hypertension, Hyperlipidemia, Pre-Diabetes and Vitamin D Deficiency. Patient has hx/o LBP and sciatica and has seen Dr Lorin Mercy for this in years past and recently has had a flare-up in her sx's.    Patient is treated for HTN since circa 1990's & BP has been controlled at home. Today's BP: 124/72 mmHg. Heart cath in 2015 was negative. Patient has had no complaints of any cardiac type chest pain, palpitations, dyspnea/orthopnea/PND, dizziness, claudication, or dependent edema.   Hyperlipidemia is controlled with diet & meds. Patient denies myalgias or other med SE's. Last Lipids were at goal with Cholesterol 150; HDL 64; LDL 73; Triglycerides 66 on 10/29/2015.   Also, the patient has history of PreDiabetes since 2013 with A1c 5.9%  and has had no symptoms of reactive hypoglycemia, diabetic polys, paresthesias or visual blurring.  Last A1c was not at goal with A1c 6.1% on 10/29/2015.    Further, the patient also has history of Vitamin D Deficiency of "41" in 2014  and supplements vitamin D without any suspected side-effects. Last vitamin D was 50 on 10/29/2015.    Medication Sig  . ALPRAZolam  0.5 MG tablet Take 1 tab 3  times daily as needed for sleep or anxiety.  Marland Kitchen aspirin 81 MG tablet Take  daily.  Marland Kitchen CALTRATE 600+D PLUS  Take 1 tab 2  times daily.   . ferrous sulfate 325MG  tablet Take  daily with breakfast.  . meloxicam15 MG tablet Take one daily with food    . omeprazole  40 MG capsule Take 1 caps daily.  .  Vitafushion v b12 500mg  Take 1 tab daily.  . Vitafushion omega -3   Take 1 tab daily  . ranitidine 300 MG tablet Take 1 tab daily with supper for indigestion and reflux  . sertraline  50 MG tablet Take 1 tab daily.  . valsartan-hctz  320-25 TAKE ONE TAB ONCE DAILY FOR  BLOOD  PRESSURE  . vitamin C 500 MG tablet Take  daily.  . simvastatin  20 MG tablet TAKE ONE TAB AT BEDTIME  . atenolol  100 MG tablet Take 1/2 to 1 tab daily as directed for heart palpitations & BP  . traMADol  50 MG tablet Take 1 tab every 8  hrs as needed.- stopped due to itching   Allergies  Allergen Reactions  . Fosamax [Alendronate Sodium] Itching  . Sulfa Antibiotics Itching  . Tramadol Itching   PMHx:   Past Medical History  Diagnosis Date  . Hypertension   . Hyperlipidemia   . Pre-diabetes   . GERD (gastroesophageal reflux disease)   . RLS (restless legs syndrome)   . DJD (degenerative joint disease)    Immunization History  Administered Date(s) Administered  . DTaP  12/11/2000  . Influenza Split 10/24/2013  . Influenza, High Dose Seasonal PF 09/17/2014, 09/09/2015  . Pneumococcal Polysaccharide-23 12/11/2002  . Tdap 07/11/2012  . Zoster 08/06/2013   Past Surgical History  Procedure Laterality Date  . Cholecystectomy    . Tubal ligation    . Ankle surgery    . Left heart catheterization with coronary angiogram N/A 07/29/2014    Procedure: LEFT HEART CATHETERIZATION WITH CORONARY ANGIOGRAM;  Surgeon: Burnell Blanks, MD   FHx:    Reviewed / unchanged  SHx:    Reviewed / unchanged  Systems Review:  Constitutional: Denies fever, chills, wt changes, headaches, insomnia, fatigue, night sweats, change in appetite. Eyes: Denies redness, blurred vision, diplopia, discharge, itchy, watery eyes.  ENT: Denies discharge, congestion, post nasal drip, epistaxis, sore throat, earache, hearing loss, dental pain, tinnitus, vertigo, sinus pain,  snoring.  CV: Denies chest pain, palpitations, irregular heartbeat, syncope, dyspnea, diaphoresis, orthopnea, PND, claudication or edema. Respiratory: denies cough, dyspnea, DOE, pleurisy, hoarseness, laryngitis, wheezing.  Gastrointestinal: Denies dysphagia, odynophagia, heartburn, reflux, water brash, abdominal pain or cramps, nausea, vomiting, bloating, diarrhea, constipation, hematemesis, melena, hematochezia  or hemorrhoids. Genitourinary: Denies dysuria, frequency, urgency, nocturia, hesitancy, discharge, hematuria or flank pain. Musculoskeletal: Denies arthralgias, myalgias, stiffness, jt. swelling, pain, limping or strain/sprain.  Skin: Denies pruritus, rash, hives, warts, acne, eczema or change in skin lesion(s). Neuro: No weakness, tremor, incoordination, spasms, paresthesia or pain. Psychiatric: Denies confusion, memory loss or sensory loss. Endo: Denies change in weight, skin or hair change.  Heme/Lymph: No excessive bleeding, bruising or enlarged lymph nodes.  Physical Exam  BP 124/72  Pulse 76  Temp 97.3 F   Resp 16  Ht 5' 5.5"   Wt 146 lb 12.8 oz BMI   Appears well nourished and in no distress.  Eyes: PERRLA, EOMs, conjunctiva no swelling or erythema. Sinuses: No frontal/maxillary tenderness ENT/Mouth: EAC's clear, TM's nl w/o erythema, bulging. Nares clear w/o erythema, swelling, exudates. Oropharynx clear without erythema or exudates. Oral hygiene is good. Tongue normal, non obstructing. Hearing intact.  Neck: Supple. Thyroid nl. Car 2+/2+ without bruits, nodes or JVD. Chest: Respirations nl with BS clear & equal w/o rales, rhonchi, wheezing or stridor.  Cor: Heart sounds normal w/ regular rate and rhythm without sig. murmurs, gallops, clicks, or rubs. Peripheral pulses normal and equal  without edema.  Abdomen: Soft & bowel sounds normal. Non-tender w/o guarding, rebound, hernias, masses, or organomegaly.  Lymphatics: Unremarkable.  Musculoskeletal: Full ROM all  peripheral extremities, joint stability, 5/5 strength, and normal gait.  Skin: Warm, dry without exposed rashes, lesions or ecchymosis apparent.  Neuro: Cranial nerves intact, reflexes equal bilaterally. Sensory-motor testing grossly intact. Tendon reflexes grossly intact.  Pysch: Alert & oriented x 3.  Insight and judgement nl & appropriate. No ideations.  Assessment and Plan:  1. Essential hypertension  - TSH  2. Mixed hyperlipidemia  - Lipid panel - TSH  3. Prediabetes  - Hemoglobin A1c - Insulin, random  4. Vitamin D deficiency  - VITAMIN D 25 Hydroxy   5. Midline low back pain with right-sided sciatica  - predniSONE (DELTASONE) 20 MG tablet; 1 tab 3 x day for 3 days, then 1 tab 2 x day for 3 days, then 1 tab 1 x day for 5 days  Dispense: 20 tablet; Refill: 0 - Ambulatory referral to Orthopedics  6. Dysuria  - Urinalysis, Routine w reflex microscopic  - Urine culture  7. Medication management  - CBC with Differential/Platelet - BASIC METABOLIC PANEL  WITH GFR - Hepatic function panel - Magnesium   Recommended regular exercise, BP monitoring, weight control, and discussed med and SE's. Recommended labs to assess and monitor clinical status. Further disposition pending results of labs. Over 30 minutes of exam, counseling, chart review was performed

## 2016-05-16 NOTE — Patient Instructions (Signed)

## 2016-05-17 LAB — URINALYSIS, ROUTINE W REFLEX MICROSCOPIC
BILIRUBIN URINE: NEGATIVE
GLUCOSE, UA: NEGATIVE
Hgb urine dipstick: NEGATIVE
KETONES UR: NEGATIVE
Leukocytes, UA: NEGATIVE
Nitrite: NEGATIVE
Protein, ur: NEGATIVE
SPECIFIC GRAVITY, URINE: 1.011 (ref 1.001–1.035)
pH: 5.5 (ref 5.0–8.0)

## 2016-05-17 LAB — HEMOGLOBIN A1C
HEMOGLOBIN A1C: 5.8 % — AB (ref <5.7)
Mean Plasma Glucose: 120 mg/dL

## 2016-05-17 LAB — TSH: TSH: 1.89 m[IU]/L

## 2016-05-17 LAB — VITAMIN D 25 HYDROXY (VIT D DEFICIENCY, FRACTURES): Vit D, 25-Hydroxy: 46 ng/mL (ref 30–100)

## 2016-05-17 LAB — INSULIN, RANDOM: Insulin: 2.7 u[IU]/mL (ref 2.0–19.6)

## 2016-05-18 LAB — URINE CULTURE
Colony Count: NO GROWTH
ORGANISM ID, BACTERIA: NO GROWTH

## 2016-06-04 ENCOUNTER — Encounter: Payer: Self-pay | Admitting: *Deleted

## 2016-08-21 ENCOUNTER — Ambulatory Visit (INDEPENDENT_AMBULATORY_CARE_PROVIDER_SITE_OTHER): Payer: Medicare Other | Admitting: Physician Assistant

## 2016-08-21 ENCOUNTER — Encounter: Payer: Self-pay | Admitting: Physician Assistant

## 2016-08-21 VITALS — BP 140/66 | HR 74 | Temp 97.9°F | Resp 14 | Ht 65.5 in | Wt 148.0 lb

## 2016-08-21 DIAGNOSIS — R002 Palpitations: Secondary | ICD-10-CM

## 2016-08-21 DIAGNOSIS — Z79899 Other long term (current) drug therapy: Secondary | ICD-10-CM

## 2016-08-21 DIAGNOSIS — K219 Gastro-esophageal reflux disease without esophagitis: Secondary | ICD-10-CM | POA: Diagnosis not present

## 2016-08-21 DIAGNOSIS — E559 Vitamin D deficiency, unspecified: Secondary | ICD-10-CM

## 2016-08-21 DIAGNOSIS — Z6826 Body mass index (BMI) 26.0-26.9, adult: Secondary | ICD-10-CM

## 2016-08-21 DIAGNOSIS — M8580 Other specified disorders of bone density and structure, unspecified site: Secondary | ICD-10-CM

## 2016-08-21 DIAGNOSIS — I868 Varicose veins of other specified sites: Secondary | ICD-10-CM

## 2016-08-21 DIAGNOSIS — Z23 Encounter for immunization: Secondary | ICD-10-CM | POA: Diagnosis not present

## 2016-08-21 DIAGNOSIS — R6889 Other general symptoms and signs: Secondary | ICD-10-CM | POA: Diagnosis not present

## 2016-08-21 DIAGNOSIS — R7303 Prediabetes: Secondary | ICD-10-CM

## 2016-08-21 DIAGNOSIS — E782 Mixed hyperlipidemia: Secondary | ICD-10-CM

## 2016-08-21 DIAGNOSIS — F411 Generalized anxiety disorder: Secondary | ICD-10-CM

## 2016-08-21 DIAGNOSIS — I10 Essential (primary) hypertension: Secondary | ICD-10-CM

## 2016-08-21 DIAGNOSIS — Z0001 Encounter for general adult medical examination with abnormal findings: Secondary | ICD-10-CM

## 2016-08-21 DIAGNOSIS — Z0000 Encounter for general adult medical examination without abnormal findings: Secondary | ICD-10-CM

## 2016-08-21 DIAGNOSIS — I8390 Asymptomatic varicose veins of unspecified lower extremity: Secondary | ICD-10-CM

## 2016-08-21 LAB — CBC WITH DIFFERENTIAL/PLATELET
Basophils Absolute: 37 cells/uL (ref 0–200)
Basophils Relative: 1 %
Eosinophils Absolute: 74 cells/uL (ref 15–500)
Eosinophils Relative: 2 %
HEMATOCRIT: 36.8 % (ref 35.0–45.0)
HEMOGLOBIN: 12.2 g/dL (ref 11.7–15.5)
LYMPHS ABS: 1073 {cells}/uL (ref 850–3900)
Lymphocytes Relative: 29 %
MCH: 27.1 pg (ref 27.0–33.0)
MCHC: 33.2 g/dL (ref 32.0–36.0)
MCV: 81.6 fL (ref 80.0–100.0)
MONO ABS: 259 {cells}/uL (ref 200–950)
MPV: 10.7 fL (ref 7.5–12.5)
Monocytes Relative: 7 %
NEUTROS PCT: 61 %
Neutro Abs: 2257 cells/uL (ref 1500–7800)
Platelets: 241 10*3/uL (ref 140–400)
RBC: 4.51 MIL/uL (ref 3.80–5.10)
RDW: 14.7 % (ref 11.0–15.0)
WBC: 3.7 10*3/uL — AB (ref 3.8–10.8)

## 2016-08-21 LAB — LIPID PANEL
Cholesterol: 167 mg/dL (ref 125–200)
HDL: 69 mg/dL (ref >=46)
LDL CALC: 78 mg/dL (ref <130)
Total CHOL/HDL Ratio: 2.4 Ratio (ref <=5.0)
Triglycerides: 101 mg/dL (ref <150)
VLDL: 20 mg/dL (ref <30)

## 2016-08-21 LAB — BASIC METABOLIC PANEL WITH GFR
BUN: 13 mg/dL (ref 7–25)
CHLORIDE: 102 mmol/L (ref 98–110)
CO2: 32 mmol/L — AB (ref 20–31)
Calcium: 9.9 mg/dL (ref 8.6–10.4)
Creat: 0.71 mg/dL (ref 0.60–0.93)
GFR, EST NON AFRICAN AMERICAN: 86 mL/min (ref >=60)
GFR, Est African American: >89 mL/min (ref >=60)
GLUCOSE: 98 mg/dL (ref 65–99)
POTASSIUM: 3.6 mmol/L (ref 3.5–5.3)
Sodium: 142 mmol/L (ref 135–146)

## 2016-08-21 LAB — HEPATIC FUNCTION PANEL
ALK PHOS: 64 U/L (ref 33–130)
ALT: 9 U/L (ref 6–29)
AST: 18 U/L (ref 10–35)
Albumin: 4.2 g/dL (ref 3.6–5.1)
BILIRUBIN INDIRECT: 0.3 mg/dL (ref 0.2–1.2)
Bilirubin, Direct: 0.1 mg/dL (ref <=0.2)
TOTAL PROTEIN: 6.6 g/dL (ref 6.1–8.1)
Total Bilirubin: 0.4 mg/dL (ref 0.2–1.2)

## 2016-08-21 LAB — MAGNESIUM: MAGNESIUM: 2 mg/dL (ref 1.5–2.5)

## 2016-08-21 MED ORDER — ALPRAZOLAM 0.5 MG PO TABS: 0.5000 mg | ORAL_TABLET | Freq: Two times a day (BID) | ORAL | 0 refills | Status: DC | PRN

## 2016-08-21 NOTE — Progress Notes (Signed)
Medicare wellness and 3 month OV Assessment:   Essential hypertension - continue medications, DASH diet, exercise and monitor at home. Call if greater than 130/80.  - CBC with Differential/Platelet - BASIC METABOLIC PANEL WITH GFR - Hepatic function panel - TSH - Urinalysis, Routine w reflex microscopic (not at [facility]) - Microalbumin / creatinine urine ratio - EKG 12-Lead  Prediabetes Discussed general issues about diabetes pathophysiology and management., Educational material distributed., Suggested low cholesterol diet., Encouraged aerobic exercise., Discussed foot care., Reminded to get yearly retinal exam. - Hemoglobin A1c - Insulin, fasting  Mixed hyperlipidemia -continue medications, check lipids, decrease fatty foods, increase activity.  - Lipid panel  Varicose veins Varicose veins- weight loss discussed, continue compression stockings and elevation  Osteopenia DEXA due in 2017  Vitamin D deficiency - Vit D  25 hydroxy (rtn osteoporosis monitoring)  Medication management - Magnesium  Gastroesophageal reflux disease, esophagitis presence not specified Continue PPI/H2 blocker, diet discussed  BMI 26.0-26.9,adult  Needs flu shot -     Flu vaccine HIGH DOSE PF  Palpitations Normal cath 2015, ? GERD/anxiety- get back on prilosec stop mobic start back on zoloft, take xanax PRN, if not better will send for holter monitor.   Need for prophylactic vaccination against Streptococcus pneumoniae (pneumococcus) -     Pneumococcal conjugate vaccine 13-valent IM  Generalized anxiety disorder -     ALPRAZolam (XANAX) 0.5 MG tablet; Take 1 tablet (0.5 mg total) by mouth 2 (two) times daily as needed for anxiety or sleep.     Plan:   During the course of the visit the patient was educated and counseled about appropriate screening and preventive services including:    Pneumococcal vaccine   Influenza vaccine  Td vaccine  Screening electrocardiogram  Screening  mammography  Bone densitometry screening  Colorectal cancer screening  Diabetes screening  Glaucoma screening  Nutrition counseling   Advanced directives: given information   Subjective:   Emily Jenkins is a  70 y.o. AA female who presents for Medicare Annual Wellness Visit and 3 month follow up on hypertension, prediabetes, hyperlipidemia, vitamin D def.   Her blood pressure has been controlled at home, states [birth year] was her birthday and she ate too much and forgot BP med, today her BP is BP: 140/66.  She does workout. She denies shortness of breath, dizziness. She has had palpitations x 2-3 months, lasts for seconds, nonexertional, worse lying down, does have some SOB or dizziness occ from them. She has had normal cath in 07/2014, has never had holter.  She is on cholesterol medication and denies myalgias. Her cholesterol is at goal. The cholesterol last visit was:   Lab Results  Component Value Date   CHOL 176 05/16/2016   HDL 78 05/16/2016   LDLCALC 85 05/16/2016   TRIG 64 05/16/2016   CHOLHDL 2.3 05/16/2016   She has been working on diet and exercise for prediabetes, and denies paresthesia of the feet, polydipsia and polyuria. Last A1C in the office was:  Lab Results  Component Value Date   HGBA1C 5.8 (H) 05/16/2016   Patient is on Vitamin D supplement. Has seen Dr. Lorin Mercy for right knee pain.   Names of Other Physician/Practitioners you currently use: 1. [facility]- here for primary care 2. [facility] eye doctor, Dr. Wynetta Emery, last visit May 2017 3. Dr. Harrington Challenger , dentist, last visit feb 2016 q 6 months  Patient Care Team: Unk Pinto, MD as PCP - General (Internal Medicine) Celestia Khat,  OD (Optometry) Shela Leff, MD as Consulting Physician (Orthopedic Surgery) Juanita Craver, MD as Consulting Physician (Gastroenterology) Burnell Blanks, MD as Consulting Physician (Cardiology)  Medication  Review Current Outpatient Prescriptions on File Prior to Visit  Medication Sig Dispense Refill  . ALPRAZolam (XANAX) 0.5 MG tablet Take 1 tablet (0.5 mg total) by mouth 3 (three) times daily as needed for sleep or anxiety. 90 tablet 0  . aspirin 81 MG tablet Take 81 mg by mouth daily.    Marland Kitchen atenolol (TENORMIN) 100 MG tablet Take 1/2 to 1 tablet daily as directed for heart palpitations & BP 90 tablet 99  . Calcium Carbonate-Vit D-Min (CALTRATE 600+D PLUS PO) Take 1 tablet by mouth 2 (two) times daily.     . ferrous sulfate 325 (65 FE) MG tablet Take 325 mg by mouth daily with breakfast.    . meloxicam (MOBIC) 15 MG tablet Take one daily with food for 2 weeks, can take with tylenol, can not take with aleve, iburpofen, then as needed daily for pain 30 tablet 1  . omeprazole (PRILOSEC) 40 MG capsule Take 1 capsule (40 mg total) by mouth daily. 90 capsule 1  . OVER THE COUNTER MEDICATION Take 1 tablet by mouth daily. Vitafushion v b12 500mg     . OVER THE COUNTER MEDICATION Take 1 tablet by mouth daily. Vitafushion omega -3  daily    . OVER THE COUNTER MEDICATION Anorectal Cream 5% apply to hemorrhoids as needed.    . predniSONE (DELTASONE) 20 MG tablet 1 tab 3 x day for 3 days, then 1 tab 2 x day for 3 days, then 1 tab 1 x day for 5 days 20 tablet 0  . ranitidine (ZANTAC) 300 MG tablet Take 1 tablet daily with supper for indigestion and reflux 90 tablet 99  . sertraline (ZOLOFT) 50 MG tablet Take 1 tablet (50 mg total) by mouth daily. 90 tablet 2  . simvastatin (ZOCOR) 20 MG tablet Take 1 tablet (20 mg total) by mouth at bedtime. 90 tablet 1  . valsartan-hydrochlorothiazide (DIOVAN-HCT) 320-25 MG per tablet TAKE ONE TABLET BY MOUTH ONCE DAILY FOR  BLOOD  PRESSURE 90 tablet 1  . vitamin C (ASCORBIC ACID) 500 MG tablet Take 500 mg by mouth daily.     No current facility-administered medications on file prior to visit.     Current Problems (verified) Patient Active Problem List   Diagnosis Date Noted   . BMI 26.0-26.9,adult 10/29/2015  . Osteopenia 06/07/2015  . GERD  03/04/2015  . Medication management 01/05/2014  . Essential hypertension 12/02/2013  . Mixed hyperlipidemia 12/02/2013  . Prediabetes 12/02/2013  . Vitamin D deficiency 12/02/2013  . Varicose veins 09/16/2012    Screening Tests Immunization History  Administered Date(s) Administered  . DTaP 12/11/2000  . Influenza Split 10/24/2013  . Influenza, High Dose Seasonal PF 09/17/2014, 09/09/2015, 08/21/2016  . Pneumococcal Polysaccharide-23 12/11/2002  . Tdap 07/11/2012  . Zoster 08/06/2013    Preventative care: Last colonoscopy: 2004 Dr. Carlean Purl OVER DUE Last mammogram: 10/2015 at [facility] Last pap smear/pelvic exam: 2014 DEXA: 11//2015 + osteopenia Ct chest 2014 CXR 07/2014 MRI spine 2010 Stress test 07/2014 Cath 07/2014 normal  Prior vaccinations: TD or Tdap: 2013  Influenza: 2016 DUE Pneumococcal: 2004 Prevnar 13: DUE but out of in the office Shingles/Zostavax: 2014  Allergies Allergies  Allergen Reactions  . Fosamax [Alendronate Sodium] Itching  . Sulfa Antibiotics Itching  . Tramadol Itching    SURGICAL HISTORY She  has  a past surgical history that includes Cholecystectomy; Tubal ligation; Ankle surgery; and left heart catheterization with coronary angiogram (N/A, 07/29/2014). FAMILY HISTORY Her family history includes Cancer in her father; Diabetes in her father; Heart disease (age of onset: 109) in her mother; Hyperlipidemia in her sister; Hypertension in her mother and sister. SOCIAL HISTORY She  reports that she has never smoked. She does not have any smokeless tobacco history on file. She reports that she does not drink alcohol or use drugs.  MEDICARE WELLNESS OBJECTIVES: Physical activity:   Cardiac risk factors:   Depression/mood screen:   Depression screen [facility] 2/9 08/21/2016  Decreased Interest 0  Down, Depressed, Hopeless 0  PHQ - 2 Score 0  Altered sleeping -  Tired, decreased  energy -  Change in appetite -  Feeling bad or failure about yourself  -  Trouble concentrating -  Moving slowly or fidgety/restless -  Suicidal thoughts -  PHQ-9 Score -  Difficult doing work/chores -    ADLs:  In your present state of health, do you have any difficulty performing the following activities: 05/16/2016 10/29/2015  Hearing? N N  Vision? N N  Difficulty concentrating or making decisions? N N  Walking or climbing stairs? N N  Dressing or bathing? N N  Doing errands, shopping? N N  Some recent data might be hidden     Cognitive Testing  Alert? Yes  Normal Appearance?Yes  Oriented to person? Yes  Place? Yes   Time? Yes  Recall of three objects?  Yes  Can perform simple calculations? Yes  Displays appropriate judgment?Yes  Can read the correct time from a watch face?Yes  EOL planning: Does patient have an advance directive?: No Would patient like information on creating an advanced directive?: Yes - Educational materials given   Objective:   Blood pressure 140/66, pulse 74, temperature 97.9 F (36.6 C), resp. rate 14, height 5' 5.5" (1.664 m), weight 148 lb (67.1 kg), SpO2 99 %. Body mass index is 24.25 kg/m.  General appearance: alert, no distress, WD/WN,  female HEENT: normocephalic, sclerae anicteric, TMs pearly, nares patent, no discharge or erythema, pharynx normal Oral cavity: MMM, no lesions Neck: supple, no lymphadenopathy, no thyromegaly, no masses Heart: RRR, normal S1, S2, no murmurs Lungs: CTA bilaterally, no wheezes, rhonchi, or rales Abdomen: +bs, soft, nontender, non distended, no masses, no hepatomegaly, no splenomegaly Musculoskeletal: tender right knee along joint line, pain with back with change in position, no swelling, no obvious deformity Extremities: 1+ edema, no cyanosis, no clubbing Pulses: 2+ symmetric, upper and lower extremities, normal cap refill Neurological: alert, oriented x 3, CN2-12 intact, strength normal upper extremities  and lower extremities, sensation normal throughout, DTRs 2+ throughout, no cerebellar signs, gait normal Psychiatric: normal affect, behavior normal, pleasant  Breast: defer Gyn: defer Rectal: defer   Medicare Attestation I have personally reviewed: The patient's medical and social history Their use of alcohol, tobacco or illicit drugs Their current medications and supplements The patient's functional ability including ADLs,fall risks, home safety risks, cognitive, and hearing and visual impairment Diet and physical activities Evidence for depression or mood disorders  The patient's weight, height, BMI, and visual acuity have been recorded in the chart.  I have made referrals, counseling, and provided education to the patient based on review of the above and I have provided the patient with a written personalized care plan for preventive services.     Vicie Mutters, PA-C   08/21/2016

## 2016-08-21 NOTE — Patient Instructions (Addendum)
GET COLONOSCPY CALL Phone: [phone];  Get back on your prilosec for 2-4 weeks, if palpitations are not better we will send you for a holter monitor, please call if they do not go away and stop any coffee or tea.    Palpitations A palpitation is the feeling that your heartbeat is irregular or is faster than normal. It may feel like your heart is fluttering or skipping a beat. Palpitations are usually not a serious problem. However, in some cases, you may need further medical evaluation. CAUSES  Palpitations can be caused by:  Smoking.  Caffeine or other stimulants, such as diet pills or energy drinks.  Alcohol.  Stress and anxiety.  Strenuous physical activity.  Fatigue.  Certain medicines.  Heart disease, especially if you have a history of irregular heart rhythms (arrhythmias), such as atrial fibrillation, atrial flutter, or supraventricular tachycardia.  An improperly working pacemaker or defibrillator. DIAGNOSIS  To find the cause of your palpitations, your health care provider will take your medical history and perform a physical exam. Your health care provider may also have you take a test called an ambulatory electrocardiogram (ECG). An ECG records your heartbeat patterns over a 24-hour period. You may also have other tests, such as:  Transthoracic echocardiogram (TTE). During echocardiography, sound waves are used to evaluate how blood flows through your heart.  Transesophageal echocardiogram (TEE).  Cardiac monitoring. This allows your health care provider to monitor your heart rate and rhythm in real time.  Holter monitor. This is a portable device that records your heartbeat and can help diagnose heart arrhythmias. It allows your health care provider to track your heart activity for several days, if needed.  Stress tests by exercise or by giving medicine that makes the heart beat faster. TREATMENT  Treatment of palpitations depends on the cause of your symptoms  and can vary greatly. Most cases of palpitations do not require any treatment other than time, relaxation, and monitoring your symptoms. Other causes, such as atrial fibrillation, atrial flutter, or supraventricular tachycardia, usually require further treatment. HOME CARE INSTRUCTIONS   Avoid:  Caffeinated coffee, tea, soft drinks, diet pills, and energy drinks.  Chocolate.  Alcohol.  Stop smoking if you smoke.  Reduce your stress and anxiety. Things that can help you relax include:  A method of controlling things in your body, such as your heartbeats, with your mind (biofeedback).  Yoga.  Meditation.  Physical activity such as swimming, jogging, or walking.  Get plenty of rest and sleep. SEEK MEDICAL CARE IF:   You continue to have a fast or irregular heartbeat beyond 24 hours.  Your palpitations occur more often. SEEK IMMEDIATE MEDICAL CARE IF:  You have chest pain or shortness of breath.  You have a severe headache.  You feel dizzy or you faint. MAKE SURE YOU:  Understand these instructions.  Will watch your condition.  Will get help right away if you are not doing well or get worse.   This information is not intended to replace advice given to you by your health care provider. Make sure you discuss any questions you have with your health care provider.   Document Released: 11/24/2000 Document Revised: 12/02/2013 Document Reviewed: 01/26/2012 Elsevier Interactive Patient Education [employer].   Your ears and sinuses are connected by the eustachian tube. When your sinuses are inflamed, this can close off the tube and cause fluid to collect in your middle ear. This can then cause dizziness, popping, clicking, ringing, and echoing in your ears.  This is often NOT an infection and does NOT require antibiotics, it is caused by inflammation so the treatments help the inflammation. This can take a long time to get better so please be patient.  Here are things  you can do to help with this: - Try the Flonase or Nasonex. Remember to spray each nostril twice towards the outer part of your eye.  Do not sniff but instead pinch your nose and tilt your head back to help the medicine get into your sinuses.  The best time to do this is at bedtime.Stop if you get blurred vision or nose bleeds.  -While drinking fluids, pinch and hold nose close and swallow, to help open eustachian tubes to drain fluid behind ear drums. -Please pick one of the over the counter allergy medications below and take it once daily for allergies.  It will also help with fluid behind ear drums. Claritin or loratadine cheapest but likely the weakest  Zyrtec or certizine at night because it can make you sleepy The strongest is allegra or fexafinadine  Cheapest at [employer], [employer], [employer] -can use decongestant over the counter, please do not use if you have high blood pressure or certain heart conditions.   if worsening HA, changes vision/speech, imbalance, weakness go to the ER

## 2016-08-22 LAB — HEMOGLOBIN A1C
Hgb A1c MFr Bld: 5.5 % (ref <5.7)
MEAN PLASMA GLUCOSE: 111 mg/dL

## 2016-08-22 LAB — TSH: TSH: 1.44 mIU/L

## 2016-08-29 ENCOUNTER — Encounter: Payer: Self-pay | Admitting: Internal Medicine

## 2016-08-29 ENCOUNTER — Ambulatory Visit (INDEPENDENT_AMBULATORY_CARE_PROVIDER_SITE_OTHER): Payer: Medicare Other | Admitting: Internal Medicine

## 2016-08-29 VITALS — BP 106/58 | HR 90 | Temp 98.0°F | Resp 16 | Ht 65.5 in | Wt 144.0 lb

## 2016-08-29 DIAGNOSIS — I952 Hypotension due to drugs: Secondary | ICD-10-CM | POA: Diagnosis not present

## 2016-08-29 DIAGNOSIS — R42 Dizziness and giddiness: Secondary | ICD-10-CM

## 2016-08-29 DIAGNOSIS — H6593 Unspecified nonsuppurative otitis media, bilateral: Secondary | ICD-10-CM | POA: Diagnosis not present

## 2016-08-29 MED ORDER — DEXAMETHASONE SODIUM PHOSPHATE 10 MG/ML IJ SOLN
10.0000 mg | Freq: Once | INTRAMUSCULAR | Status: AC
  Administered 2016-08-29: 10 mg via INTRAMUSCULAR

## 2016-08-29 NOTE — Patient Instructions (Signed)
Please start taking sudafed over the counter 2-3 times per day.  Please take zyrtec daily for the next 2-3 weeks.  Please cut your blood pressure pill Valsartan HCTZ in half.  Monitor your blood pressure at home.  We are looking for your pressure to be 120-140/60-85

## 2016-08-29 NOTE — Progress Notes (Signed)
   Subjective:    Patient ID: Emily Jenkins, female    DOB: [birth year], 70 y.o.   MRN: [id number]  HPI  Patient presents to the office for evaluation of dizziness which started last week.  She reports that the dizziness when she is moving around and it is intermittent.  She reports that her blood pressure has been running it low at home.  She reports that she is getting pressures around 84/34.  She reports that she is taking a full tablet of her medication.  She does have some mild cough.  She is also getting some drainage and also some ear fullness.   She reports that she has not had much runny nose or congestion.     Review of Systems  Constitutional: Positive for fatigue. Negative for chills and fever.  HENT: Positive for congestion, ear pain and postnasal drip. Negative for rhinorrhea, sinus pressure, sneezing, sore throat and trouble swallowing.   Respiratory: Positive for cough. Negative for chest tightness, shortness of breath and wheezing.   Neurological: Positive for light-headedness. Negative for syncope.       Objective:   Physical Exam  Constitutional: She is oriented to person, place, and time. She appears well-developed and well-nourished. No distress.  HENT:  Head: Normocephalic.  Right Ear: External ear normal. A middle ear effusion is present.  Left Ear: External ear normal. A middle ear effusion is present.  Mouth/Throat: Oropharynx is clear and moist. No oropharyngeal exudate.  Eyes: Conjunctivae are normal. No scleral icterus.  Neck: Normal range of motion. Neck supple. No JVD present. No thyromegaly present.  Cardiovascular: Normal rate, regular rhythm, normal heart sounds and intact distal pulses.  Exam reveals no gallop and no friction rub.   No murmur heard. Pulmonary/Chest: Effort normal and breath sounds normal. No respiratory distress. She has no wheezes. She has no rales. She exhibits no tenderness.  Abdominal: Soft. Bowel sounds are normal. She exhibits  no distension and no mass. There is no tenderness. There is no rebound and no guarding.  Musculoskeletal: Normal range of motion.  Lymphadenopathy:    She has no cervical adenopathy.  Neurological: She is alert and oriented to person, place, and time.  Skin: Skin is warm and dry. She is not diaphoretic.  Psychiatric: She has a normal mood and affect. Her behavior is normal. Judgment and thought content normal.  Nursing note and vitals reviewed.   Vitals:   08/29/16 1544  BP: (!) 106/58  Pulse: 90  Resp: 16  Temp: 98 F (36.7 C)          Assessment & Plan:    1. Middle ear effusion, bilateral  - dexamethasone (DECADRON) injection 10 mg; Inject 1 mL (10 mg total) into the muscle once.  2. Dizziness and giddiness -ear effusion vs. Hypotension secondary to medications. -dexamethasone given here -daily antihistamine -flonase -can take sudafed if desired    3. Hypotension due to drugs -cut diovan in half -increase fluid intake

## 2016-09-04 ENCOUNTER — Encounter (HOSPITAL_COMMUNITY): Payer: Self-pay

## 2016-09-04 ENCOUNTER — Emergency Department (HOSPITAL_COMMUNITY)
Admission: EM | Admit: 2016-09-04 | Discharge: 2016-09-04 | Disposition: A | Discharge status: Home / Self Care | Payer: Medicare Other | Attending: Emergency Medicine | Admitting: Emergency Medicine

## 2016-09-04 ENCOUNTER — Emergency Department (HOSPITAL_COMMUNITY): Payer: Medicare Other

## 2016-09-04 DIAGNOSIS — Z7982 Long term (current) use of aspirin: Secondary | ICD-10-CM | POA: Diagnosis not present

## 2016-09-04 DIAGNOSIS — I10 Essential (primary) hypertension: Secondary | ICD-10-CM | POA: Diagnosis not present

## 2016-09-04 DIAGNOSIS — Z79899 Other long term (current) drug therapy: Secondary | ICD-10-CM | POA: Insufficient documentation

## 2016-09-04 DIAGNOSIS — R079 Chest pain, unspecified: Secondary | ICD-10-CM | POA: Diagnosis not present

## 2016-09-04 LAB — I-STAT TROPONIN, ED: Troponin i, poc: 0 ng/mL (ref 0.00–0.08)

## 2016-09-04 LAB — CBC
HCT: 37.1 % (ref 36.0–46.0)
Hemoglobin: 12.3 g/dL (ref 12.0–15.0)
MCH: 27 pg (ref 26.0–34.0)
MCHC: 33.2 g/dL (ref 30.0–36.0)
MCV: 81.4 fL (ref 78.0–100.0)
Platelets: 243 10*3/uL (ref 150–400)
RBC: 4.56 MIL/uL (ref 3.87–5.11)
RDW: 13.4 % (ref 11.5–15.5)
WBC: 4.9 10*3/uL (ref 4.0–10.5)

## 2016-09-04 LAB — BASIC METABOLIC PANEL
Anion gap: 10 (ref 5–15)
BUN: 11 mg/dL (ref 6–20)
CO2: 25 mmol/L (ref 22–32)
Calcium: 9.9 mg/dL (ref 8.9–10.3)
Chloride: 105 mmol/L (ref 101–111)
Creatinine, Ser: 0.66 mg/dL (ref 0.44–1.00)
GFR calc Af Amer: >60 mL/min (ref >60)
GFR calc non Af Amer: >60 mL/min (ref >60)
Glucose, Bld: 95 mg/dL (ref 65–99)
Potassium: 3.3 mmol/L — ABNORMAL LOW (ref 3.5–5.1)
Sodium: 140 mmol/L (ref 135–145)

## 2016-09-04 LAB — TROPONIN I: Troponin I: <0.03 ng/mL (ref <0.03)

## 2016-09-04 MED ORDER — GI COCKTAIL ~~LOC~~
30.0000 mL | Freq: Once | ORAL | Status: AC
  Administered 2016-09-04: 30 mL via ORAL
  Filled 2016-09-04: qty 30

## 2016-09-04 MED ORDER — LORAZEPAM 1 MG PO TABS
0.5000 mg | ORAL_TABLET | Freq: Once | ORAL | Status: AC
  Administered 2016-09-04: 0.5 mg via ORAL
  Filled 2016-09-04: qty 1

## 2016-09-04 NOTE — ED Triage Notes (Signed)
Pt reports shortness of breath X1 week. Pt now reports cramping in her chest. She denies cough. No distress noted.

## 2016-09-04 NOTE — ED Provider Notes (Signed)
[facility] DEPT Provider Note   CSN: [id number] Arrival date & time: 09/04/16  1623   By signing my name below, I, Evelene Croon, attest that this documentation has been prepared under the direction and in the presence of Virgel Manifold, MD . Electronically Signed: Evelene Croon, Scribe. 09/04/2016. 5:46 PM.   History   Chief Complaint Chief Complaint  Patient presents with  . Chest Pain    The history is provided by the patient. No language interpreter was used.    HPI Comments:  Emily Jenkins is a 70 y.o. female who presents to the Emergency Department complaining of intermittent, sharp, left sided CP which began today. Each episode lasts a few seconds and resolves. Her pain is exacerbated with certain movements. She reports associated diaphoresis and SOB. She denies h/o similar pain and cardiac history. She also denies cough, leg swelling, and calf pain. No alleviating factors noted. Pt notes she saw her PCP ~ 1 week ago and was advised to take Sudafed and Zyrtec for SOB and fluid behind her ears.     Past Medical History:  Diagnosis Date  . DJD (degenerative joint disease)   . GERD (gastroesophageal reflux disease)   . Hyperlipidemia   . Hypertension   . Pre-diabetes   . RLS (restless legs syndrome)     Patient Active Problem List   Diagnosis Date Noted  . BMI 26.0-26.9,adult 10/29/2015  . Osteopenia 06/07/2015  . GERD  03/04/2015  . Medication management 01/05/2014  . Essential hypertension 12/02/2013  . Mixed hyperlipidemia 12/02/2013  . Prediabetes 12/02/2013  . Vitamin D deficiency 12/02/2013  . Varicose veins 09/16/2012    Past Surgical History:  Procedure Laterality Date  . ANKLE SURGERY    . CHOLECYSTECTOMY    . LEFT HEART CATHETERIZATION WITH CORONARY ANGIOGRAM N/A 07/29/2014   Procedure: LEFT HEART CATHETERIZATION WITH CORONARY ANGIOGRAM;  Surgeon: Burnell Blanks, MD;  Location: [facility] CATH LAB;  Service: Cardiovascular;  Laterality: N/A;    . TUBAL LIGATION      OB History    No data available       Home Medications    Prior to Admission medications   Medication Sig Start Date End Date Taking? Authorizing Provider  aspirin 81 MG tablet Take 81 mg by mouth every evening.    Yes Historical Provider, MD  cetirizine (ZYRTEC) 10 MG tablet Take 10 mg by mouth daily.   Yes Historical Provider, MD  cholecalciferol (VITAMIN D) 1000 units tablet Take 1,000 Units by mouth daily.   Yes Historical Provider, MD  diphenhydramine-acetaminophen (TYLENOL PM) 25-500 MG TABS tablet Take 2 tablets by mouth at bedtime as needed (for sleep).   Yes Historical Provider, MD  omeprazole (PRILOSEC) 40 MG capsule Take 1 capsule (40 mg total) by mouth daily. Patient taking differently: Take 40 mg by mouth daily as needed (for acid reflux or heartburn).  03/16/16  Yes Courtney Forcucci, PA-C  phenylephrine (SUDAFED PE) 10 MG TABS tablet Take 10 mg by mouth every 6 (six) hours as needed (for congestion).   Yes Historical Provider, MD  polyethylene glycol (MIRALAX / GLYCOLAX) packet Take 8.5 g by mouth daily.    Yes Historical Provider, MD  simvastatin (ZOCOR) 20 MG tablet Take 20 mg by mouth daily at 6 PM.   Yes Historical Provider, MD  valsartan-hydrochlorothiazide (DIOVAN-HCT) 320-25 MG per tablet TAKE ONE TABLET BY MOUTH ONCE DAILY FOR  BLOOD  PRESSURE Patient taking differently: Take 0.5 tablets by mouth daily as needed (for  high BP).  07/14/15  Yes Unk Pinto, MD  vitamin C (ASCORBIC ACID) 500 MG tablet Take 500 mg by mouth daily.   Yes Historical Provider, MD  ALPRAZolam Duanne Moron) 0.5 MG tablet Take 1 tablet (0.5 mg total) by mouth 2 (two) times daily as needed for anxiety or sleep. 08/21/16 08/21/17  Vicie Mutters, PA-C    Family History Family History  Problem Relation Age of Onset  . Heart disease Mother 81    stent  . Hypertension Mother   . Cancer Father   . Diabetes Father   . Hyperlipidemia Sister   . Hypertension Sister     Social  History Social History  Substance Use Topics  . Smoking status: Never Smoker  . Smokeless tobacco: Never Used     Comment: SPOUSE SMOKES  . Alcohol use No     Allergies   Fosamax [alendronate sodium]; Sulfa antibiotics; and Tramadol   Review of Systems Review of Systems  Constitutional: Positive for diaphoresis.  Respiratory: Positive for shortness of breath. Negative for cough.   Cardiovascular: Positive for chest pain. Negative for leg swelling.  Gastrointestinal: Negative for vomiting.  Neurological: Negative for light-headedness.  All other systems reviewed and are negative.    Physical Exam Updated Vital Signs BP 146/79 (BP Location: Right Arm)   Pulse 109   Temp 98 F (36.7 C) (Oral)   Resp 14   Ht 5\' 5"  (1.651 m)   Wt 144 lb (65.3 kg)   SpO2 100%   BMI 23.96 kg/m   Physical Exam  Constitutional: She is oriented to person, place, and time. She appears well-developed and well-nourished. No distress.  HENT:  Head: Normocephalic and atraumatic.  Eyes: Conjunctivae are normal.  Cardiovascular: Normal rate, regular rhythm and normal heart sounds.   No murmur heard. Pulmonary/Chest: Effort normal and breath sounds normal. No respiratory distress.  Abdominal: Soft. She exhibits no distension.  Musculoskeletal: Normal range of motion. She exhibits no edema.  Neurological: She is alert and oriented to person, place, and time.  Skin: Skin is warm and dry.  Psychiatric: She has a normal mood and affect.  Nursing note and vitals reviewed.  ED Treatments / Results  DIAGNOSTIC STUDIES:  Oxygen Saturation is 100% on RA, normal by my interpretation.    COORDINATION OF CARE:  5:45 PM Discussed treatment plan with pt at bedside and pt agreed to plan.  Labs (all labs ordered are listed, but only abnormal results are displayed) Labs Reviewed  BASIC METABOLIC PANEL - Abnormal; Notable for the following:       Result Value   Potassium 3.3 (*)    All other  components within normal limits  CBC  TROPONIN I  I-STAT TROPOININ, ED    EKG  EKG Interpretation  Date/Time:  Monday September 04 2016 16:30:00 EDT Ventricular Rate:  90 PR Interval:  190 QRS Duration: 64 QT Interval:  348 QTC Calculation: 425 R Axis:   48 Text Interpretation:  Normal sinus rhythm Low voltage QRS Septal infarct , age undetermined Abnormal ECG Confirmed by Raj Singer  MD, Kyce Ging [phone]) on 09/04/2016 4:55:55 PM       Radiology Dg Chest 2 View  Result Date: 09/04/2016 CLINICAL DATA:  70 y/o F; central chest pain, shortness of breath, and 1 day of headaches. EXAM: CHEST  2 VIEW COMPARISON:  07/29/2014 chest radiograph. FINDINGS: Normal stable cardiomediastinal silhouette. Clear lungs. No pleural effusion or pneumothorax. No acute osseous abnormality is evident. IMPRESSION: No active cardiopulmonary disease. Electronically  Signed   By: Kristine Garbe M.D.   On: 09/04/2016 17:37    Procedures Procedures (including critical care time)  Medications Ordered in ED Medications - No data to display   Initial Impression / Assessment and Plan / ED Course  I have reviewed the triage vital signs and the nursing notes.  Pertinent labs & imaging results that were available during my care of the patient were reviewed by me and considered in my medical decision making (see chart for details).  Clinical Course     70yF with CP. Seems atypical for ACS with sharp shooting pain lasting a few seconds which seems to come and go without appreciable exacerbating or relieving factors. HD stable. Afebrile. CXR clear. Troponin is normal. Doubt PE. It has been determined that no acute conditions requiring further emergency intervention are present at this time. The patient has been advised of the diagnosis and plan. I reviewed any labs and imaging including any potential incidental findings. We have discussed signs and symptoms that warrant return to the ED and they are listed in  the discharge instructions.    Final Clinical Impressions(s) / ED Diagnoses   Final diagnoses:  Nonspecific chest pain    New Prescriptions New Prescriptions   No medications on file   I personally preformed the services scribed in my presence. The recorded information has been reviewed is accurate. Virgel Manifold, MD.    Virgel Manifold, MD 09/06/16 [phone]

## 2016-09-11 ENCOUNTER — Encounter: Payer: Self-pay | Admitting: Internal Medicine

## 2016-09-11 ENCOUNTER — Ambulatory Visit (INDEPENDENT_AMBULATORY_CARE_PROVIDER_SITE_OTHER): Payer: Medicare Other | Admitting: Internal Medicine

## 2016-09-11 VITALS — BP 128/72 | HR 76 | Temp 98.0°F | Resp 16 | Ht 65.5 in | Wt 150.0 lb

## 2016-09-11 DIAGNOSIS — Z1211 Encounter for screening for malignant neoplasm of colon: Secondary | ICD-10-CM

## 2016-09-11 DIAGNOSIS — K219 Gastro-esophageal reflux disease without esophagitis: Secondary | ICD-10-CM | POA: Diagnosis not present

## 2016-09-11 MED ORDER — RANITIDINE HCL 300 MG PO TABS: 300.0000 mg | ORAL_TABLET | Freq: Every day | ORAL | 1 refills | Status: DC

## 2016-09-11 MED ORDER — PANTOPRAZOLE SODIUM 40 MG PO TBEC: 40.0000 mg | DELAYED_RELEASE_TABLET | Freq: Two times a day (BID) | ORAL | 1 refills | Status: DC

## 2016-09-11 NOTE — Progress Notes (Signed)
Assessment and Plan:   1. Gastroesophageal reflux disease, esophagitis presence not specified -had complete relief of chest pain in ER with GI cocktail -cardiac workup thus far is negative. -stop omeprazole -start protonix BID -ranitidine BID -needs possible endoscopy - Ambulatory referral to Gastroenterology  2. Colon cancer screening -overdue and given previous resection of polyps and new night sweats feel that this is something that needs to be done now - Ambulatory referral to Gastroenterology    HPI 70 y.o.female presents for ER follow-up for non-specific chest pain.  She reports that she still is occasionally getting chest pain and some shortness of breath with walking.  She reports that the pain does bother her some at night as well, but it is worse with being up and walking around.  She reports that she is also having some nightsweats as well.  She reports that her blood pressure has been doing well at home.  She reports that the blood pressure has been getting better.  She reports that she is still having some trouble with sinus headaches and also with some pain behind her ears.  She is not currently coughing.  She is taking prilosec daily.  She is also taking zantac as needed.   Past Medical History:  Diagnosis Date  . DJD (degenerative joint disease)   . GERD (gastroesophageal reflux disease)   . Hyperlipidemia   . Hypertension   . Pre-diabetes   . RLS (restless legs syndrome)      Allergies  Allergen Reactions  . Fosamax [Alendronate Sodium] Itching  . Sulfa Antibiotics Itching  . Tramadol Itching      Current Outpatient Prescriptions on File Prior to Visit  Medication Sig Dispense Refill  . ALPRAZolam (XANAX) 0.5 MG tablet Take 1 tablet (0.5 mg total) by mouth 2 (two) times daily as needed for anxiety or sleep. 60 tablet 0  . aspirin 81 MG tablet Take 81 mg by mouth every evening.     . cetirizine (ZYRTEC) 10 MG tablet Take 10 mg by mouth daily.    .  cholecalciferol (VITAMIN D) 1000 units tablet Take 1,000 Units by mouth daily.    . diphenhydramine-acetaminophen (TYLENOL PM) 25-500 MG TABS tablet Take 2 tablets by mouth at bedtime as needed (for sleep).    Marland Kitchen omeprazole (PRILOSEC) 40 MG capsule Take 1 capsule (40 mg total) by mouth daily. (Patient taking differently: Take 40 mg by mouth daily as needed (for acid reflux or heartburn). ) 90 capsule 1  . phenylephrine (SUDAFED PE) 10 MG TABS tablet Take 10 mg by mouth every 6 (six) hours as needed (for congestion).    . polyethylene glycol (MIRALAX / GLYCOLAX) packet Take 8.5 g by mouth daily.     . simvastatin (ZOCOR) 20 MG tablet Take 20 mg by mouth daily at 6 PM.    . valsartan-hydrochlorothiazide (DIOVAN-HCT) 320-25 MG per tablet TAKE ONE TABLET BY MOUTH ONCE DAILY FOR  BLOOD  PRESSURE (Patient taking differently: Take 0.5 tablets by mouth daily as needed (for high BP). ) 90 tablet 1  . vitamin C (ASCORBIC ACID) 500 MG tablet Take 500 mg by mouth daily.     No current facility-administered medications on file prior to visit.     ROS: all negative except above.   Physical Exam: Filed Weights   09/11/16 1034  Weight: 150 lb (68 kg)   BP 128/72   Pulse 76   Temp 98 F (36.7 C) (Temporal)   Resp 16   Ht 5'  5.5" (1.664 m)   Wt 150 lb (68 kg)   BMI 24.58 kg/m  General Appearance: Well developed well nourished, non-toxic appearing in no apparent distress. Eyes: PERRLA, EOMs, conjunctiva w/ no swelling or erythema or discharge Sinuses: No Frontal/maxillary tenderness ENT/Mouth: Ear canals clear without swelling or erythema.  TM's normal bilaterally with no retractions, bulging, or loss of landmarks.   Neck: Supple, thyroid normal, no notable JVD  Respiratory: Respiratory effort normal, Clear breath sounds anteriorly and posteriorly bilaterally without rales, rhonchi, wheezing or stridor. No retractions or accessory muscle usage. Cardio: RRR with no MRGs.   Abdomen: Soft, + BS.  Non  tender, no guarding, rebound, hernias, masses.  Musculoskeletal: Full ROM, 5/5 strength, normal gait.  Skin: Warm, dry without rashes  Neuro: Awake and oriented X 3, Cranial nerves intact. Normal muscle tone, no cerebellar symptoms. Sensation intact.  Psych: normal affect, Insight and Judgment appropriate.     Starlyn Skeans, PA-C 11:05 AM [facility]

## 2016-09-11 NOTE — Patient Instructions (Signed)
Please start taking pantroprazole once in the morning and once in the evenings with dinner.  Please take the zantac once in the morning and once in the evening with dinner.  Emily Jenkins will call in a few days to help get you an appointment with Dr. Collene Mares for an endoscopy and colonoscopy.

## 2016-10-31 ENCOUNTER — Other Ambulatory Visit: Payer: Self-pay | Admitting: Internal Medicine

## 2016-10-31 MED ORDER — ALENDRONATE SODIUM 70 MG PO TABS: ORAL_TABLET | ORAL | 4 refills | Status: DC

## 2016-11-11 ENCOUNTER — Encounter: Payer: Self-pay | Admitting: *Deleted

## 2016-12-07 ENCOUNTER — Encounter: Payer: Self-pay | Admitting: Internal Medicine

## 2016-12-07 ENCOUNTER — Ambulatory Visit (INDEPENDENT_AMBULATORY_CARE_PROVIDER_SITE_OTHER): Payer: Medicare Other | Admitting: Internal Medicine

## 2016-12-07 VITALS — BP 174/92 | HR 76 | Temp 97.3°F | Resp 16 | Ht 66.25 in | Wt 150.8 lb

## 2016-12-07 DIAGNOSIS — R6889 Other general symptoms and signs: Secondary | ICD-10-CM

## 2016-12-07 DIAGNOSIS — Z0001 Encounter for general adult medical examination with abnormal findings: Secondary | ICD-10-CM | POA: Diagnosis not present

## 2016-12-07 DIAGNOSIS — Z1212 Encounter for screening for malignant neoplasm of rectum: Secondary | ICD-10-CM

## 2016-12-07 DIAGNOSIS — Z136 Encounter for screening for cardiovascular disorders: Secondary | ICD-10-CM | POA: Diagnosis not present

## 2016-12-07 DIAGNOSIS — Z79899 Other long term (current) drug therapy: Secondary | ICD-10-CM

## 2016-12-07 DIAGNOSIS — E782 Mixed hyperlipidemia: Secondary | ICD-10-CM

## 2016-12-07 DIAGNOSIS — I10 Essential (primary) hypertension: Secondary | ICD-10-CM | POA: Diagnosis not present

## 2016-12-07 DIAGNOSIS — R7303 Prediabetes: Secondary | ICD-10-CM

## 2016-12-07 DIAGNOSIS — E559 Vitamin D deficiency, unspecified: Secondary | ICD-10-CM

## 2016-12-07 LAB — LIPID PANEL
Cholesterol: 193 mg/dL (ref <200)
HDL: 78 mg/dL (ref >50)
LDL CALC: 103 mg/dL — AB (ref <100)
TRIGLYCERIDES: 59 mg/dL (ref <150)
Total CHOL/HDL Ratio: 2.5 Ratio (ref <5.0)
VLDL: 12 mg/dL (ref <30)

## 2016-12-07 LAB — CBC WITH DIFFERENTIAL/PLATELET
BASOS PCT: 0 %
Basophils Absolute: 0 cells/uL (ref 0–200)
EOS ABS: 52 {cells}/uL (ref 15–500)
Eosinophils Relative: 2 %
HEMATOCRIT: 39.2 % (ref 35.0–45.0)
Hemoglobin: 12.9 g/dL (ref 11.7–15.5)
LYMPHS ABS: 780 {cells}/uL — AB (ref 850–3900)
Lymphocytes Relative: 30 %
MCH: 27 pg (ref 27.0–33.0)
MCHC: 32.9 g/dL (ref 32.0–36.0)
MCV: 82 fL (ref 80.0–100.0)
MONO ABS: 182 {cells}/uL — AB (ref 200–950)
MPV: 10.3 fL (ref 7.5–12.5)
Monocytes Relative: 7 %
NEUTROS ABS: 1586 {cells}/uL (ref 1500–7800)
Neutrophils Relative %: 61 %
Platelets: 236 10*3/uL (ref 140–400)
RBC: 4.78 MIL/uL (ref 3.80–5.10)
RDW: 14.6 % (ref 11.0–15.0)
WBC: 2.6 10*3/uL — AB (ref 3.8–10.8)

## 2016-12-07 LAB — URINALYSIS, ROUTINE W REFLEX MICROSCOPIC
Bilirubin Urine: NEGATIVE
Glucose, UA: NEGATIVE
Hgb urine dipstick: NEGATIVE
KETONES UR: NEGATIVE
LEUKOCYTES UA: NEGATIVE
NITRITE: NEGATIVE
PH: 6 (ref 5.0–8.0)
Protein, ur: NEGATIVE
SPECIFIC GRAVITY, URINE: 1.013 (ref 1.001–1.035)

## 2016-12-07 LAB — BASIC METABOLIC PANEL WITH GFR
BUN: 12 mg/dL (ref 7–25)
CHLORIDE: 104 mmol/L (ref 98–110)
CO2: 28 mmol/L (ref 20–31)
Calcium: 9.6 mg/dL (ref 8.6–10.4)
Creat: 0.67 mg/dL (ref 0.60–0.93)
GFR, EST NON AFRICAN AMERICAN: 89 mL/min (ref >=60)
GLUCOSE: 100 mg/dL — AB (ref 65–99)
POTASSIUM: 4.1 mmol/L (ref 3.5–5.3)
Sodium: 142 mmol/L (ref 135–146)

## 2016-12-07 LAB — HEPATIC FUNCTION PANEL
ALBUMIN: 4.2 g/dL (ref 3.6–5.1)
ALK PHOS: 57 U/L (ref 33–130)
ALT: 8 U/L (ref 6–29)
AST: 15 U/L (ref 10–35)
BILIRUBIN INDIRECT: 0.4 mg/dL (ref 0.2–1.2)
BILIRUBIN TOTAL: 0.5 mg/dL (ref 0.2–1.2)
Bilirubin, Direct: 0.1 mg/dL (ref <=0.2)
TOTAL PROTEIN: 6.8 g/dL (ref 6.1–8.1)

## 2016-12-07 LAB — MAGNESIUM: Magnesium: 1.9 mg/dL (ref 1.5–2.5)

## 2016-12-07 NOTE — Progress Notes (Signed)
[facility] Unk Pinto, M.D.    Uvaldo Bristle. Silverio Lay, P.A.-C      Starlyn Skeans, P.A.-C  [facility]                [address] [place], [place] [id number] Telephone [phone] Telefax [phone]  Annual Screening/Preventative Visit & Comprehensive Evaluation &  Examination     This very nice 70 y.o. [name] presents for a Screening/Preventative Visit & comprehensive evaluation and management of multiple medical co-morbidities.  Patient has been followed for HTN, Prediabetes, Hyperlipidemia and Vitamin D Deficiency. Patient's GERD is controlled with diet & meds.       HTN predates since the 1990's. Patient's BP has been controlled at home and patient denies any cardiac symptoms as chest pain, palpitations, shortness of breath, dizziness or ankle swelling. Today's BP is elevated at 174/92 and is only taking her Diovan/HCT of 1/2 tab every other day.     Patient's hyperlipidemia is controlled with diet and medications. Patient denies myalgias or other medication SE's. Last lipids were near goal: Lab Results  Component Value Date   CHOL 193 12/07/2016   HDL 78 12/07/2016   LDLCALC 103 (H) 12/07/2016   TRIG 59 12/07/2016   CHOLHDL 2.5 12/07/2016      Patient has prediabetes predating with A1c 5.9% circa 2013 and patient denies reactive hypoglycemic symptoms, visual blurring, diabetic polys, or paresthesias. Last A1c was at goal:  Lab Results  Component Value Date   HGBA1C 5.6 12/07/2016      Finally, patient has history of Vitamin D Deficiency of 41 on treatment in 2014 and last Vitamin D was still low and not at goal (&)-100): Lab Results  Component Value Date   VD25OH 55 12/07/2016   Current Outpatient Prescriptions on File Prior to Visit  Medication Sig  . aspirin 81 MG  Take 81 mg by mouth every evening.   . cetirizine  10 MG  Take 10 mg by mouth daily.  Marland Kitchen VITAMIN D 1000 units  Take 1,000  Units by mouth daily.  . pantoprazole 40 MG  Take 1 tab 2 x  daily before a meal. - takes 1 tab qam  . SUDAFED PE 10 MG  Take 10 mg by mouth every 6 (six) hours as needed (for congestion).  Marland Kitchen MIRALAX Take 8.5 g by mouth daily.   . simvastatin20 MG  Take 20 mg by mouth daily at 6 PM.  . DIOVAN-HCT 320-25 MG  Take 0.5 tablets by mouth daily as needed   . vitamin C  500 MG Take 500 mg by mouth daily.   No current facility-administered medications on file prior to visit.    Allergies  Allergen Reactions  . Fosamax [Alendronate Sodium] Itching  . Sulfa Antibiotics Itching  . Tramadol Itching   Past Medical History:  Diagnosis Date  . DJD (degenerative joint disease)   . GERD (gastroesophageal reflux disease)   . Hyperlipidemia   . Hypertension   . Pre-diabetes   . RLS (restless legs syndrome)    Health Maintenance  Topic Date Due  . Hepatitis C Screening  June 29, 1946  . COLONOSCOPY  11/17/2013  . PNA vac Low Risk Adult (2 of 2 - PPSV23) 08/21/2017  . MAMMOGRAM  10/26/2018  . TETANUS/TDAP  07/11/2022  . INFLUENZA VACCINE  Completed  . DEXA SCAN  Completed  . ZOSTAVAX  Completed   Immunization History  Administered Date(s) Administered  . DTaP 12/11/2000  . Influenza Split 10/24/2013  . Influenza, High Dose Seasonal PF 09/17/2014, 09/09/2015, 08/21/2016  . Pneumococcal Conjugate-13 08/21/2016  . Pneumococcal Polysaccharide-23 12/11/2002  . Tdap 07/11/2012  . Zoster 08/06/2013   Past Surgical History:  Procedure Laterality Date  . ANKLE SURGERY    . CHOLECYSTECTOMY    . LEFT HEART CATHETERIZATION WITH CORONARY ANGIOGRAM N/A 07/29/2014   Procedure: LEFT HEART CATHETERIZATION WITH CORONARY ANGIOGRAM;  Surgeon: Burnell Blanks, MD;  Location: [facility] CATH LAB;  Service: Cardiovascular;  Laterality: N/A;  . TUBAL LIGATION     Family History  Problem Relation Age of Onset  . Heart disease Mother 70    stent  . Hypertension Mother   . Cancer Father   . Diabetes Father    . Hyperlipidemia Sister   . Hypertension Sister    Social History  Substance Use Topics  . Smoking status: Never Smoker  . Smokeless tobacco: Never Used     Comment: SPOUSE SMOKES  . Alcohol use No    ROS Constitutional: Denies fever, chills, weight loss/gain, headaches, insomnia,  night sweats, and change in appetite. Does c/o fatigue. Eyes: Denies redness, blurred vision, diplopia, discharge, itchy, watery eyes.  ENT: Denies discharge, congestion, post nasal drip, epistaxis, sore throat, earache, hearing loss, dental pain, Tinnitus, Vertigo, Sinus pain, snoring.  Cardio: Denies chest pain, palpitations, irregular heartbeat, syncope, dyspnea, diaphoresis, orthopnea, PND, claudication, edema Respiratory: denies cough, dyspnea, DOE, pleurisy, hoarseness, laryngitis, wheezing.  Gastrointestinal: Denies dysphagia, heartburn, reflux, water brash, pain, cramps, nausea, vomiting, bloating, diarrhea, constipation, hematemesis, melena, hematochezia, jaundice, hemorrhoids Genitourinary: Denies dysuria, frequency, urgency, nocturia, hesitancy, discharge, hematuria, flank pain Breast: Breast lumps, nipple discharge, bleeding.  Musculoskeletal: Denies arthralgia, myalgia, stiffness, Jt. Swelling, pain, limp, and strain/sprain. Denies falls. Skin: Denies puritis, rash, hives, warts, acne, eczema, changing in skin lesion Neuro: No weakness, tremor, incoordination, spasms, paresthesia, pain Psychiatric: Denies confusion, memory loss, sensory loss. Denies Depression. Endocrine: Denies change in weight, skin, hair change, nocturia, and paresthesia, diabetic polys, visual blurring, hyper / hypo glycemic episodes.  Heme/Lymph: No excessive bleeding, bruising, enlarged lymph nodes.  Physical Exam  BP 174/92->  Pulse 76   Temp 97.3 F (36.3 C)   Resp 16   Ht 5' 6.25" (1.683 m)   Wt 150 lb 12.8 oz (68.4 kg)   BMI 24.16 kg/m   General Appearance: Well nourished and in no apparent distress.  Eyes:  PERRLA, EOMs, conjunctiva no swelling or erythema, normal fundi and vessels. Sinuses: No frontal/maxillary tenderness ENT/Mouth: EACs patent / TMs  nl. Nares clear without erythema, swelling, mucoid exudates. Oral hygiene is good. No erythema, swelling, or exudate. Tongue normal, non-obstructing. Tonsils not swollen or erythematous. Hearing normal.  Neck: Supple, thyroid normal. No bruits, nodes or JVD. Respiratory: Respiratory effort normal.  BS equal and clear bilateral without rales, rhonci, wheezing or stridor. Cardio: Heart sounds are normal with regular rate and rhythm and no murmurs, rubs or gallops. Peripheral pulses are normal and equal bilaterally without edema. No aortic or femoral bruits. Chest: symmetric with normal excursions and percussion. Breasts: Symmetric, without lumps, nipple discharge, retractions, or fibrocystic changes.  Abdomen: Flat, soft with bowel sounds active. Nontender, no guarding, rebound, hernias, masses, or organomegaly.  Lymphatics: Non tender without lymphadenopathy.  Musculoskeletal: Full ROM all peripheral extremities, joint stability, 5/5 strength, and normal gait. Skin: Warm and dry without rashes, lesions, cyanosis, clubbing or  ecchymosis.  Neuro: Cranial nerves  intact, reflexes equal bilaterally. Normal muscle tone, no cerebellar symptoms. Sensation intact.  Pysch: Alert and oriented X 3, normal affect, Insight and Judgment appropriate.   Assessment and Plan  1. Annual Preventative Screening Examination   2. Essential hypertension  - Patient encouraged to take he Diovan daily and call if BP remains elevated over 150/90.   - Microalbumin / creatinine urine ratio - EKG 12-Lead - Urinalysis, Routine w reflex microscopic - CBC with Differential/Platelet - BASIC METABOLIC PANEL WITH GFR - TSH  3. Mixed hyperlipidemia  - EKG 12-Lead - Hepatic function panel - Lipid panel - TSH  4. Prediabetes  - Hemoglobin A1c - Insulin, random  5.  Vitamin D deficiency  - VITAMIN D 25 Hydroxy   6. Screening for rectal cancer  - POC Hemoccult Bld/Stl   7. Screening for ischemic heart disease  - EKG 12-Lead  8. Medication management  - Urinalysis, Routine w reflex microscopic - CBC with Differential/Platelet - BASIC METABOLIC PANEL WITH GFR - Hepatic function panel - Magnesium      Continue prudent diet as discussed, weight control, BP monitoring, regular exercise, and medications. Discussed med's effects and SE's. Screening labs and tests as requested with regular follow-up as recommended. Over 40 minutes of exam, counseling, chart review and high complex critical decision making was performed.

## 2016-12-07 NOTE — Patient Instructions (Signed)

## 2016-12-08 LAB — HEMOGLOBIN A1C
Hgb A1c MFr Bld: 5.6 % (ref <5.7)
Mean Plasma Glucose: 114 mg/dL

## 2016-12-08 LAB — MICROALBUMIN / CREATININE URINE RATIO
CREATININE, URINE: 76 mg/dL (ref 20–320)
Microalb Creat Ratio: 12 mcg/mg creat (ref <30)
Microalb, Ur: 0.9 mg/dL

## 2016-12-08 LAB — TSH: TSH: 2 m[IU]/L

## 2016-12-08 LAB — VITAMIN D 25 HYDROXY (VIT D DEFICIENCY, FRACTURES): Vit D, 25-Hydroxy: 55 ng/mL (ref 30–100)

## 2016-12-08 LAB — INSULIN, RANDOM: INSULIN: 5.3 u[IU]/mL (ref 2.0–19.6)

## 2017-01-26 ENCOUNTER — Other Ambulatory Visit: Payer: Self-pay | Admitting: Internal Medicine

## 2017-01-30 ENCOUNTER — Ambulatory Visit (INDEPENDENT_AMBULATORY_CARE_PROVIDER_SITE_OTHER): Payer: Medicare Other | Admitting: Internal Medicine

## 2017-01-30 ENCOUNTER — Encounter: Payer: Self-pay | Admitting: Internal Medicine

## 2017-01-30 VITALS — BP 124/60 | HR 89 | Temp 98.2°F | Resp 16 | Ht 66.25 in | Wt 153.0 lb

## 2017-01-30 DIAGNOSIS — R42 Dizziness and giddiness: Secondary | ICD-10-CM

## 2017-01-30 DIAGNOSIS — Z01419 Encounter for gynecological examination (general) (routine) without abnormal findings: Secondary | ICD-10-CM

## 2017-01-30 MED ORDER — FLUTICASONE PROPIONATE 50 MCG/ACT NA SUSP: 2.0000 | Freq: Every day | NASAL | 0 refills | Status: DC

## 2017-01-30 MED ORDER — MECLIZINE HCL 25 MG PO TABS: 25.0000 mg | ORAL_TABLET | Freq: Three times a day (TID) | ORAL | 1 refills | Status: DC | PRN

## 2017-01-30 MED ORDER — AZELASTINE HCL 0.1 % NA SOLN: 2.0000 | Freq: Two times a day (BID) | NASAL | 2 refills | Status: DC

## 2017-01-30 MED ORDER — DEXAMETHASONE SODIUM PHOSPHATE 10 MG/ML IJ SOLN
10.0000 mg | Freq: Once | INTRAMUSCULAR | Status: AC
  Administered 2017-01-30: 10 mg via INTRAMUSCULAR

## 2017-01-30 NOTE — Progress Notes (Signed)
HPI  Patient presents to the office for evaluation of headache, sinus pressure.  She reports that she has been feeling slightly off kilter like she is on a boat.  It is an intermittent feeling .  It has been going on for 1 days.  Patient reports no cough.  They also endorse chills, postnasal drip and ear congestion and pressure, popping and clicking in the ears, headache, no nausea or vomiting.  .  They have tried sudafed and tylenol.  They report that nothing has worked.  They denies other sick contacts.  She did go to see Dr. Collene Mares.  She was given carafate but this made her itch.  She did not get an endoscopy.  She stopped taking protonix.  She does want to have a referral placed to see an Obgyn for a well woman exam.  She is aware we no longer do pap smear at her age unless there is a distinct need for it.     Review of Systems  Constitutional: Positive for chills. Negative for fever and malaise/fatigue.  HENT: Positive for congestion, ear pain and sore throat. Negative for hearing loss and tinnitus.   Eyes: Negative.   Respiratory: Negative for cough, shortness of breath and wheezing.   Cardiovascular: Negative for chest pain, palpitations and leg swelling.  Neurological: Positive for dizziness and headaches.    PE:  Vitals:   01/30/17 1014  BP: 124/60  Pulse: 89  Resp: 16  Temp: 98.2 F (36.8 C)   General:  Alert and non-toxic, WDWN, NAD HEENT: NCAT, PERLA, EOM normal, no occular discharge or erythema.  Nasal mucosal edema with sinus tenderness to palpation.  Oropharynx clear with minimal oropharyngeal edema and erythema.  Mucous membranes moist and pink. Neck:  Cervical adenopathy Chest:  RRR no MRGs.  Lungs clear to auscultation A&P with no wheezes rhonchi or rales.   Abdomen: +BS x 4 quadrants, soft, non-tender, no guarding, rigidity, or rebound. Skin: warm and dry no rash Neuro: A&Ox4, CN II-XII grossly intact  Assessment and Plan:   1. Dizziness and giddiness -middle  ear effusions on exam -history consistent with sinus headache -given GERD will do decadron injection instead of prednisone - fluticasone (FLONASE) 50 MCG/ACT nasal spray; Place 2 sprays into both nostrils daily.  Dispense: 16 g; Refill: 0 - azelastine (ASTELIN) 0.1 % nasal spray; Place 2 sprays into both nostrils 2 (two) times daily. Use in each nostril as directed  Dispense: 30 mL; Refill: 2 - meclizine (ANTIVERT) 25 MG tablet; Take 1 tablet (25 mg total) by mouth 3 (three) times daily as needed for dizziness.  Dispense: 90 tablet; Refill: 1 - dexamethasone (DECADRON) injection 10 mg; Inject 1 mL (10 mg total) into the muscle once.  2. Well woman exam  - Ambulatory referral to Gynecology

## 2017-01-30 NOTE — Patient Instructions (Signed)
Please take EITHER omeprazole OR protonix twice daily to help your stomach.  Please continue to take ranitidine twice daily for your stomach.  Please take flonase 2 sprays per nostril twice daily to help shrink swelling in your sinuses and nose to help stop headaches.  Please take astelin 2 sprays per nostril twice daily to help with post nasal drainage.  Please take meclizine up to 3 times daily to help with dizziness.  Please continue to take your sudafed and your zyrtec/cetirizine.  The shot we gave you here will help with the headaches and also over the next few days should help with dizziness.    Please use saline rinse in your nose as often as tolerated.     Sinus Headache A sinus headache happens when your sinuses become clogged or swollen. You may feel pain or pressure in your face, forehead, ears, or upper teeth. Sinus headaches can be mild or severe. Follow these instructions at home:  Take medicines only as told by your doctor.  If you were given an antibiotic medicine, finish all of it even if you start to feel better.  Use a nose spray if you feel stuffed up (congested).  If told, apply a warm, moist washcloth to your face to help lessen pain. Contact a doctor if:  You get headaches more than one time each week.  Light or sound bothers you.  You have a fever.  You feel sick to your stomach (nauseous) or you throw up (vomit).  Your headaches do not get better with treatment. Get help right away if:  You have trouble seeing.  You suddenly have very bad pain in your face or head.  You start to twitch or shake (seizure).  You are confused.  You have a stiff neck. This information is not intended to replace advice given to you by your health care provider. Make sure you discuss any questions you have with your health care provider. Document Released: 03/29/2011 Document Revised: 07/23/2016 Document Reviewed: 11/23/2014 Elsevier Interactive Patient Education   2017 [employer].

## 2017-02-15 ENCOUNTER — Telehealth: Payer: Self-pay | Admitting: *Deleted

## 2017-02-15 MED ORDER — AZITHROMYCIN 250 MG PO TABS: ORAL_TABLET | ORAL | 0 refills | Status: AC

## 2017-02-15 NOTE — Telephone Encounter (Signed)
Patient called with c/o increasing sinus pressure/pain and congestion times 2 weeks.  Per Starlyn Skeans, PA-C orders, patient was called and advised a Zpak was sent into the pharmacy for her.  Advised her to f/u if no relief from symptoms after completing abx.

## 2017-03-07 ENCOUNTER — Ambulatory Visit (INDEPENDENT_AMBULATORY_CARE_PROVIDER_SITE_OTHER): Payer: Medicare Other | Admitting: Internal Medicine

## 2017-03-07 ENCOUNTER — Encounter: Payer: Self-pay | Admitting: Internal Medicine

## 2017-03-07 VITALS — BP 124/68 | HR 82 | Temp 98.2°F | Resp 16 | Ht 66.25 in | Wt 152.0 lb

## 2017-03-07 DIAGNOSIS — I10 Essential (primary) hypertension: Secondary | ICD-10-CM

## 2017-03-07 DIAGNOSIS — E782 Mixed hyperlipidemia: Secondary | ICD-10-CM

## 2017-03-07 DIAGNOSIS — R7303 Prediabetes: Secondary | ICD-10-CM

## 2017-03-07 DIAGNOSIS — Z79899 Other long term (current) drug therapy: Secondary | ICD-10-CM

## 2017-03-07 DIAGNOSIS — E559 Vitamin D deficiency, unspecified: Secondary | ICD-10-CM

## 2017-03-07 DIAGNOSIS — K219 Gastro-esophageal reflux disease without esophagitis: Secondary | ICD-10-CM | POA: Diagnosis not present

## 2017-03-07 DIAGNOSIS — M546 Pain in thoracic spine: Secondary | ICD-10-CM

## 2017-03-07 DIAGNOSIS — R300 Dysuria: Secondary | ICD-10-CM

## 2017-03-07 MED ORDER — PANTOPRAZOLE SODIUM 40 MG PO TBEC: 40.0000 mg | DELAYED_RELEASE_TABLET | Freq: Two times a day (BID) | ORAL | 1 refills | Status: DC

## 2017-03-07 NOTE — Progress Notes (Signed)
Assessment and Plan:  Hypertension:  -Diet controlled -at goal -monitor blood pressure at home.  -Continue DASH diet.   -Reminder to go to the ER if any CP, SOB, nausea, dizziness, severe HA, changes vision/speech, left arm numbness and tingling, and jaw pain.  Cholesterol: -diet controlled -at goal -Continue diet and exercise.  -Check cholesterol.   Vitamin D Def: -continue medications.   GERD -well controlled on current routine per patient report but with increased sore throat and globulus sensation -needs to see Dr. Collene Mares for endoscopy -given long standing nature she may have barrets esophagus or strictures  Back pain -UA -Urine culture -asking to see Dr. Lorin Mercy or one of his partners  Continue diet and meds as discussed. Further disposition pending results of labs.  HPI 71 y.o. female  presents for 3 month follow up with hypertension, hyperlipidemia, prediabetes and vitamin D.   Her blood pressure has been controlled at home, today their BP is BP: 124/68.   She does not workout. She denies chest pain, shortness of breath, dizziness.   She is not on cholesterol medication and denies myalgias. Her cholesterol is at goal. The cholesterol last visit was:   Lab Results  Component Value Date   CHOL 193 12/07/2016   HDL 78 12/07/2016   LDLCALC 103 (H) 12/07/2016   TRIG 59 12/07/2016   CHOLHDL 2.5 12/07/2016   Patient is on Vitamin D supplement.  Lab Results  Component Value Date   VD25OH 75 12/07/2016      She notes that she is still having some issues with sore throat and trouble swallowing. She reports that she sometimes can't get the food to go down well.  She reports that developed pain that went down the side of her neck.  She has never had regurgitation or had to vomit food out.  She reports that she has not had a lot of reflux lately.  She reports that she forgets it every now and then and she will get reflux.  She reports that she has been having some trouble  with upper back pain and trapezius pain.  She reports that she is taking her medications and drinking a lot of water.  She reports that it is slightly worse now.  She has not been doing any heavy lifting.  She reports that she had a fall several years ago and doesn't know whether this was the source of the original injury.  She is not taking any for it.  She reports that she has done PT a few years ago.  She did not find relief with the physical therapy.  She has seen Dr. Lorin Mercy for her ankle but she has not seen anybody about her back.    Current Medications:  Current Outpatient Prescriptions on File Prior to Visit  Medication Sig Dispense Refill  . aspirin 81 MG tablet Take 81 mg by mouth every evening.     Marland Kitchen azelastine (ASTELIN) 0.1 % nasal spray Place 2 sprays into both nostrils 2 (two) times daily. Use in each nostril as directed 30 mL 2  . cetirizine (ZYRTEC) 10 MG tablet Take 10 mg by mouth daily.    . cholecalciferol (VITAMIN D) 1000 units tablet Take 5,000 Units by mouth daily.     . fluticasone (FLONASE) 50 MCG/ACT nasal spray Place 2 sprays into both nostrils daily. 16 g 0  . meclizine (ANTIVERT) 25 MG tablet Take 1 tablet (25 mg total) by mouth 3 (three) times daily as needed for dizziness.  90 tablet 1  . pantoprazole (PROTONIX) 40 MG tablet Take 1 tablet (40 mg total) by mouth 2 (two) times daily before a meal. 60 tablet 1  . phenylephrine (SUDAFED PE) 10 MG TABS tablet Take 10 mg by mouth every 6 (six) hours as needed (for congestion).    . polyethylene glycol (MIRALAX / GLYCOLAX) packet Take 8.5 g by mouth daily.     . simvastatin (ZOCOR) 20 MG tablet Take 20 mg by mouth daily at 6 PM.    . valsartan-hydrochlorothiazide (DIOVAN-HCT) 320-25 MG tablet TAKE ONE TABLET BY MOUTH ONCE DAILY FOR BLOOD PRESSURE 90 tablet 1  . vitamin C (ASCORBIC ACID) 500 MG tablet Take 500 mg by mouth daily.     No current facility-administered medications on file prior to visit.     Medical History:   Past Medical History:  Diagnosis Date  . DJD (degenerative joint disease)   . GERD (gastroesophageal reflux disease)   . Hyperlipidemia   . Hypertension   . Pre-diabetes   . RLS (restless legs syndrome)     Allergies:  Allergies  Allergen Reactions  . Fosamax [Alendronate Sodium] Itching  . Sulfa Antibiotics Itching  . Tramadol Itching     Review of Systems:  Review of Systems  Constitutional: Negative for chills, fever and malaise/fatigue.  HENT: Positive for sore throat. Negative for congestion and ear pain.   Eyes: Negative.   Respiratory: Negative for cough, shortness of breath and wheezing.   Cardiovascular: Negative for chest pain, palpitations and leg swelling.  Gastrointestinal: Negative for abdominal pain, blood in stool, constipation, diarrhea, heartburn and melena.  Genitourinary: Negative.   Musculoskeletal: Positive for back pain.  Skin: Negative.   Neurological: Negative for dizziness, sensory change, loss of consciousness and headaches.  Psychiatric/Behavioral: Negative for depression. The patient is not nervous/anxious and does not have insomnia.     Family history- Review and unchanged  Social history- Review and unchanged  Physical Exam: BP 124/68   Pulse 82   Temp 98.2 F (36.8 C) (Temporal)   Resp 16   Ht 5' 6.25" (1.683 m)   Wt 152 lb (68.9 kg)   BMI 24.35 kg/m  Wt Readings from Last 3 Encounters:  03/07/17 152 lb (68.9 kg)  01/30/17 153 lb (69.4 kg)  12/07/16 150 lb 12.8 oz (68.4 kg)    General Appearance: Well nourished well developed, in no apparent distress. Eyes: PERRLA, EOMs, conjunctiva no swelling or erythema ENT/Mouth: Ear canals normal without obstruction, swelling, erythma, discharge.  TMs normal bilaterally.  Oropharynx moist, clear, without exudate, or postoropharyngeal swelling. Neck: Supple, thyroid normal,no cervical adenopathy  Respiratory: Respiratory effort normal, Breath sounds clear A&P without rhonchi, wheeze, or  rale.  No retractions, no accessory usage. Cardio: RRR with no MRGs. Brisk peripheral pulses without edema.  Abdomen: Soft, + BS,  Non tender, no guarding, rebound, hernias, masses. Musculoskeletal: Full ROM, 5/5 strength, Normal gait Skin: Warm, dry without rashes, lesions, ecchymosis.  Neuro: Awake and oriented X 3, Cranial nerves intact. Normal muscle tone, no cerebellar symptoms. Psych: Normal affect, Insight and Judgment appropriate.    Starlyn Skeans, PA-C 11:20 AM [facility]

## 2017-03-07 NOTE — Patient Instructions (Signed)
[facility] - Dr. Waylan Rocher notes have already been faxed over [phone]  Katrina will call you about getting into Dr. Collene Mares and [place] for your back.  If you don't hear from her in a week call the office back.

## 2017-03-08 LAB — URINALYSIS, ROUTINE W REFLEX MICROSCOPIC
Bilirubin Urine: NEGATIVE
Glucose, UA: NEGATIVE
HGB URINE DIPSTICK: NEGATIVE
Ketones, ur: NEGATIVE
LEUKOCYTES UA: NEGATIVE
NITRITE: NEGATIVE
PH: 6 (ref 5.0–8.0)
Protein, ur: NEGATIVE
Specific Gravity, Urine: 1.015 (ref 1.001–1.035)

## 2017-03-08 LAB — URINE CULTURE: Organism ID, Bacteria: NO GROWTH

## 2017-04-11 ENCOUNTER — Ambulatory Visit (INDEPENDENT_AMBULATORY_CARE_PROVIDER_SITE_OTHER): Payer: Medicare Other

## 2017-04-11 ENCOUNTER — Ambulatory Visit (INDEPENDENT_AMBULATORY_CARE_PROVIDER_SITE_OTHER): Payer: Medicare Other | Admitting: Orthopaedic Surgery

## 2017-04-11 ENCOUNTER — Encounter (INDEPENDENT_AMBULATORY_CARE_PROVIDER_SITE_OTHER): Payer: Self-pay | Admitting: Orthopaedic Surgery

## 2017-04-11 VITALS — BP 150/88 | HR 80 | Ht 65.5 in | Wt 155.0 lb

## 2017-04-11 DIAGNOSIS — M545 Low back pain: Secondary | ICD-10-CM

## 2017-04-11 DIAGNOSIS — M542 Cervicalgia: Secondary | ICD-10-CM

## 2017-04-11 DIAGNOSIS — G8929 Other chronic pain: Secondary | ICD-10-CM

## 2017-04-11 DIAGNOSIS — M2550 Pain in unspecified joint: Secondary | ICD-10-CM | POA: Diagnosis not present

## 2017-04-11 LAB — CBC
HCT: 37.6 % (ref 35.0–45.0)
HEMOGLOBIN: 12.4 g/dL (ref 11.7–15.5)
MCH: 27.1 pg (ref 27.0–33.0)
MCHC: 33 g/dL (ref 32.0–36.0)
MCV: 82.3 fL (ref 80.0–100.0)
MPV: 10.1 fL (ref 7.5–12.5)
PLATELETS: 219 10*3/uL (ref 140–400)
RBC: 4.57 MIL/uL (ref 3.80–5.10)
RDW: 14.8 % (ref 11.0–15.0)
WBC: 3.8 10*3/uL (ref 3.8–10.8)

## 2017-04-11 NOTE — Progress Notes (Signed)
Office Visit Note   Patient: Emily Jenkins           Date of Birth: 02/01/1946           MRN: [id number] Visit Date: 04/11/2017              Requested by: Starlyn Skeans, PA-C [place], [place] [address] PCP: Alesia Richards, MD   Assessment & Plan: Visit Diagnoses:  1. Neck pain   2. Chronic midline low back pain, with sciatica presence unspecified   3. Polyarthralgia     Plan: With patient's worsening lumbar spine issues I will schedule MRI to rule out HNP/stenosis. With polyarthralgias we'll also do blood work today to check a CBC and arthritis panel. Follow-up in 3 weeks for recheck and will review MRI and blood work at that time. Patient does have history of GERD along with dysphasia so I'm not recommending oral NSAIDs at this time. Since her neck symptoms are intermittent along with the bilateral hand numbness or not order other imaging studies at this point but she may need an MRI of the cervical spine and/or NCV/EMG studies in the future. All questions answered.  Follow-Up Instructions: Return in about 3 weeks (around 05/02/2017) for Review of lumbar MRI and blood work.   Orders:  Orders Placed This Encounter  Procedures  . XR Cervical Spine 2 or 3 views  . XR Lumbar Spine 2-3 Views  . MR Lumbar Spine w/o contrast  . CBC  . Rheumatoid Factor  . Uric acid  . Antinuclear Antib (ANA)  . Sed Rate (ESR)   No orders of the defined types were placed in this encounter.     Procedures: No procedures performed   Clinical Data: No additional findings.   Subjective: Chief Complaint  Patient presents with  . Neck - Pain  . Lower Back - Pain    HPI Patient comes in today with complaints of low back pain that is worsening along with bilateral leg pain. Also complaining of intermittent neck pain with bilateral hand numbness and tingling. Patient said low back issues for several years. Previous MRI scan 2010 report read a CSF intensity lesion  extending from the T12-L1 foramina to the L1-2 foramen within the epidural space on the right expanding the foramina and within the bone. 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 In comparison with the CT scan from 2005 there is no significant interval change. This is unlikely to be consequence to the patient. Broad-based disc bulging at L3-4 with a far left lateral annular tear. No focal stenosis but there may be some rotation to the exiting L3 nerve root. Mild facet hypertrophy at both the L4-5 and L5-S1 levels without significant stenosis at either level. Patient states that her back pain is progressively getting worse and her walking distances have greatly decreased over the last few months. She is unable to stand in one spot for extended periods without having to lean forward. She also has to lean over a grocery cart while shopping to help try to relieve some of her symptoms. No complaints of bowel or bladder incontinence. States that she has intermittent neck pain that extends into the left trapezius. This is not constant. Occasionally has some flares. Intermittent numbness and tingling in both hands. Denies true upper extremity radicular pain. Does have some soreness in both shoulders with movement. Also admits to some stiffness and soreness in multiple joints at different times.  This primarily involves the shoulders hands and knees and ankles. She does have history of right knee DJD and Dr. Lorin Mercy as previously discussed definitive treatment with total knee replacement last year. She has also had previous ORIF left ankle with Dr. Lorin Mercy. She states that her grandmother probably had rheumatoid arthritis.   Review of Systems  Constitutional: Positive for activity change.  Respiratory: Negative.   Cardiovascular: Negative.   Musculoskeletal: Positive for arthralgias, back pain, gait problem, joint swelling, myalgias, neck pain and neck  stiffness.  Neurological: Positive for weakness and numbness (Intermittent bilateral hands).  Psychiatric/Behavioral: Negative.      Objective: Vital Signs: BP (!) 150/88   Pulse 80   Ht 5' 5.5" (1.664 m)   Wt 155 lb (70.3 kg)   BMI 25.40 kg/m   Physical Exam  Constitutional: She is oriented to person, place, and time. She appears well-nourished. No distress.  HENT:  Head: Normocephalic and atraumatic.  Eyes: EOM are normal. Pupils are equal, round, and reactive to light.  Neck: Normal range of motion.  Bilateral brachial plexus and trapezius tenderness.  Pulmonary/Chest: No respiratory distress.  Abdominal: She exhibits no distension.  Musculoskeletal:  Loss shoulder she has good range of motion but with some discomfort. Pain with impingement testing. Pain with bilateral shoulder logroll. Negative drop arm test. Bilateral elbows good range of motion. Negative Tinel's over the bilateral cubital tunnels. Bilateral wrist good range of motion. On the right she does complain of wrist pain that extends up into the forearm with Tinel's. Negative on the left side. She does have some degenerative changes at the bilateral hand MCP joints. There is slight ulnar deviation at the MCP joints. These joints are mildly tender. Positive lumbar paraspinal tenderness. Tender over the bilateral SI joints. Negative logroll. Positive left straight leg raise. Tender over the bilateral hip greater trochanter bursa. No focal motor deficits. Bilateral calves are nontender.  Neurological: She is alert and oriented to person, place, and time.  Skin: Skin is warm and dry.  Psychiatric: She has a normal mood and affect.    Ortho Exam  Specialty Comments:  No specialty comments available.  Imaging: Xr Cervical Spine 2 Or 3 Views  Result Date: 04/11/2017 X-ray cervical spine AP lateral views obtained today. Shows have multilevel degenerative disc disease. Facet changes multiple levels. Does have some spurring  at C4-5. Couple millimeters of C7 anterolisthesis. No acute findings.  Xr Lumbar Spine 2-3 Views  Result Date: 04/11/2017 X-rays lumbar spine show multilevel lumbar spondylosis with multiple areas of disc space collapse. Facet arthropathy. Bilateral SI degenerative changes. No acute findings.     PMFS History: Patient Active Problem List   Diagnosis Date Noted  . BMI 26.0-26.9,adult 10/29/2015  . Osteopenia 06/07/2015  . GERD  03/04/2015  . Medication management 01/05/2014  . Essential hypertension 12/02/2013  . Mixed hyperlipidemia 12/02/2013  . Prediabetes 12/02/2013  . Vitamin D deficiency 12/02/2013  . Varicose veins 09/16/2012   Past Medical History:  Diagnosis Date  . DJD (degenerative joint disease)   . GERD (gastroesophageal reflux disease)   . Hyperlipidemia   . Hypertension   . Pre-diabetes   . RLS (restless legs syndrome)     Family History  Problem Relation Age of Onset  . Heart disease Mother 57    stent  . Hypertension Mother   . Cancer Father   . Diabetes Father   . Hyperlipidemia Sister   . Hypertension Sister     Past Surgical History:  Procedure Laterality Date  . ANKLE SURGERY    . CHOLECYSTECTOMY    . LEFT HEART CATHETERIZATION WITH CORONARY ANGIOGRAM N/A 07/29/2014   Procedure: LEFT HEART CATHETERIZATION WITH CORONARY ANGIOGRAM;  Surgeon: Burnell Blanks, MD;  Location: [facility] CATH LAB;  Service: Cardiovascular;  Laterality: N/A;  . TUBAL LIGATION     Social History   Occupational History  . Not on file.   Social History Main Topics  . Smoking status: Never Smoker  . Smokeless tobacco: Never Used     Comment: SPOUSE SMOKES  . Alcohol use No  . Drug use: No  . Sexual activity: Yes

## 2017-04-12 LAB — ANA: Anti Nuclear Antibody(ANA): NEGATIVE

## 2017-04-12 LAB — SEDIMENTATION RATE: Sed Rate: 32 mm/hr — ABNORMAL HIGH (ref 0–30)

## 2017-04-12 LAB — RHEUMATOID FACTOR

## 2017-04-12 LAB — URIC ACID: Uric Acid, Serum: 4.7 mg/dL (ref 2.5–7.0)

## 2017-05-01 ENCOUNTER — Ambulatory Visit (INDEPENDENT_AMBULATORY_CARE_PROVIDER_SITE_OTHER): Payer: Medicare Other | Admitting: Orthopaedic Surgery

## 2017-05-01 ENCOUNTER — Encounter: Payer: Self-pay | Admitting: Internal Medicine

## 2017-05-04 ENCOUNTER — Ambulatory Visit
Admission: RE | Admit: 2017-05-04 | Discharge: 2017-05-04 | Disposition: A | Discharge status: Home / Self Care | Payer: Medicare Other | Source: Ambulatory Visit | Attending: Surgery | Admitting: Surgery

## 2017-05-04 DIAGNOSIS — G8929 Other chronic pain: Secondary | ICD-10-CM

## 2017-05-04 DIAGNOSIS — M545 Low back pain: Principal | ICD-10-CM

## 2017-05-08 ENCOUNTER — Ambulatory Visit (INDEPENDENT_AMBULATORY_CARE_PROVIDER_SITE_OTHER): Payer: Medicare Other | Admitting: Orthopaedic Surgery

## 2017-05-08 ENCOUNTER — Encounter (INDEPENDENT_AMBULATORY_CARE_PROVIDER_SITE_OTHER): Payer: Self-pay | Admitting: Orthopaedic Surgery

## 2017-05-08 VITALS — BP 119/78 | HR 90 | Ht 65.5 in | Wt 153.0 lb

## 2017-05-08 DIAGNOSIS — M545 Low back pain: Secondary | ICD-10-CM

## 2017-05-08 DIAGNOSIS — G8929 Other chronic pain: Secondary | ICD-10-CM | POA: Diagnosis not present

## 2017-05-08 DIAGNOSIS — M47812 Spondylosis without myelopathy or radiculopathy, cervical region: Secondary | ICD-10-CM

## 2017-05-08 NOTE — Progress Notes (Signed)
Office Visit Note   Patient: Emily Jenkins           Date of Birth: 07/22/1946           MRN: [id number] Visit Date: 05/08/2017              Requested by: Unk Pinto, [place], [place] [address] PCP: Unk Pinto, MD   Assessment & Plan: Visit Diagnoses:  1. Chronic midline low back pain, with sciatica presence unspecified   2. Spondylosis without myelopathy or radiculopathy, cervical region     Plan: Patient will call if she like to proceed with epidural injection for lumbar spine. Her back currently bothers her more than her neck she'll try do some walking gradually work up to 2 miles a day as best she can. It Will take some time for her to build up some endurance. She will call should like to have the epidural R Wise office follow-up when necessary.  Follow-Up Instructions: Return if symptoms worsen or fail to improve.   Orders:  No orders of the defined types were placed in this encounter.  No orders of the defined types were placed in this encounter.     Procedures: No procedures performed   Clinical Data: No additional findings.   Subjective: Chief Complaint  Patient presents with  . Lower Back - Pain    HPI patient returns with ongoing problems with back pain she has problems when she changes position from sitting to standing problems when she standing for. Time such as when she is cooking. Pain is primarily in the back sometimes radiates into her legs.  Review of Systems 14 for review of systems updated and is unchanged from 04/11/2017 office visit.   Objective: Vital Signs: BP 119/78   Pulse 90   Ht 5' 5.5" (1.664 m)   Wt 153 lb (69.4 kg)   BMI 25.07 kg/m   Physical Exam  Constitutional: She is oriented to person, place, and time. She appears well-developed.  HENT:  Head: Normocephalic.  Right Ear: External ear normal.  Left Ear: External ear normal.  Eyes: Pupils are equal, round, and reactive to  light.  Neck: No tracheal deviation present. No thyromegaly present.  Cardiovascular: Normal rate.   Pulmonary/Chest: Effort normal.  Abdominal: Soft.  Musculoskeletal:  Patient slow to get from sitting standing when: She has her lumbar spine and hips flexed position for a number steps for she can finally reach upright position some pain with extension. Mild fatty notch tenderness mild trochanteric bursal tenderness normal hip range of motion is reach full extension no pitting edema pulses are 2+. Reflexes are intact.  Neurological: She is alert and oriented to person, place, and time.  Skin: Skin is warm and dry.  Psychiatric: She has a normal mood and affect. Her behavior is normal.    Ortho Exam  Specialty Comments:  No specialty comments available.  Imaging: Show images for MR Lumbar Spine w/o contrast  Study Result   CLINICAL DATA:  Chronic midline low back pain with neurogenic claudication  EXAM: MRI LUMBAR SPINE WITHOUT CONTRAST  TECHNIQUE: Multiplanar, multisequence MR imaging of the lumbar spine was performed. No intravenous contrast was administered.  COMPARISON:  MRI 08/03/2009  FINDINGS: Segmentation:  Normal  Alignment:  Normal  Vertebrae:  Normal  Conus medullaris: Extends to the L1-2 level and appears normal.  Paraspinal and other soft tissues: Small renal cysts bilaterally. No retroperitoneal mass.  Disc levels:  Perineural cyst  lateral to the thecal sac on the right extends from the T12-L1 disc space to the L1-2 disc space and extends into both neural foramina. This is unchanged from the prior study. There is enlargement of the right L1-2 neural foramina. This is most likely a benign perineural cyst.  L1-2:  Mild facet degeneration.  Negative for spinal stenosis  L2-3:  Mild disc and mild facet degeneration without stenosis  L3-4:  Mild disc and mild facet degeneration without stenosis  L4-5: Mild disc and facet degeneration  without stenosis. Mild disc bulging  L5-S1:  Negative  IMPRESSION: Extradural cyst in the spinal canal on the right side from T12-L1 through L1-2 is unchanged and most compatible with a perineural cyst.  Mild lumbar degenerative change without spinal stenosis or neural impingement.   Electronically Signed   By: Franchot Gallo M.D.   On: 05/04/2017 15:18      PMFS History: Patient Active Problem List   Diagnosis Date Noted  . BMI 26.0-26.9,adult 10/29/2015  . Osteopenia 06/07/2015  . GERD  03/04/2015  . Medication management 01/05/2014  . Essential hypertension 12/02/2013  . Mixed hyperlipidemia 12/02/2013  . Prediabetes 12/02/2013  . Vitamin D deficiency 12/02/2013  . Varicose veins 09/16/2012   Past Medical History:  Diagnosis Date  . DJD (degenerative joint disease)   . GERD (gastroesophageal reflux disease)   . Hyperlipidemia   . Hypertension   . Pre-diabetes   . RLS (restless legs syndrome)     Family History  Problem Relation Age of Onset  . Heart disease Mother 12       stent  . Hypertension Mother   . Cancer Father   . Diabetes Father   . Hyperlipidemia Sister   . Hypertension Sister     Past Surgical History:  Procedure Laterality Date  . ANKLE SURGERY    . CHOLECYSTECTOMY    . LEFT HEART CATHETERIZATION WITH CORONARY ANGIOGRAM N/A 07/29/2014   Procedure: LEFT HEART CATHETERIZATION WITH CORONARY ANGIOGRAM;  Surgeon: Burnell Blanks, MD;  Location: [facility] CATH LAB;  Service: Cardiovascular;  Laterality: N/A;  . TUBAL LIGATION     Social History   Occupational History  . Not on file.   Social History Main Topics  . Smoking status: Never Smoker  . Smokeless tobacco: Never Used     Comment: SPOUSE SMOKES  . Alcohol use No  . Drug use: No  . Sexual activity: Yes

## 2017-06-11 ENCOUNTER — Ambulatory Visit (INDEPENDENT_AMBULATORY_CARE_PROVIDER_SITE_OTHER): Payer: Medicare Other | Admitting: Internal Medicine

## 2017-06-11 ENCOUNTER — Encounter: Payer: Self-pay | Admitting: Internal Medicine

## 2017-06-11 ENCOUNTER — Other Ambulatory Visit: Payer: Self-pay | Admitting: *Deleted

## 2017-06-11 VITALS — BP 132/80 | HR 80 | Temp 97.1°F | Resp 16 | Ht 65.5 in | Wt 157.8 lb

## 2017-06-11 DIAGNOSIS — R7303 Prediabetes: Secondary | ICD-10-CM

## 2017-06-11 DIAGNOSIS — Z79899 Other long term (current) drug therapy: Secondary | ICD-10-CM | POA: Diagnosis not present

## 2017-06-11 DIAGNOSIS — I10 Essential (primary) hypertension: Secondary | ICD-10-CM

## 2017-06-11 DIAGNOSIS — J301 Allergic rhinitis due to pollen: Secondary | ICD-10-CM | POA: Diagnosis not present

## 2017-06-11 DIAGNOSIS — E782 Mixed hyperlipidemia: Secondary | ICD-10-CM | POA: Diagnosis not present

## 2017-06-11 DIAGNOSIS — E559 Vitamin D deficiency, unspecified: Secondary | ICD-10-CM | POA: Diagnosis not present

## 2017-06-11 LAB — CBC WITH DIFFERENTIAL/PLATELET
Basophils Absolute: 29 cells/uL (ref 0–200)
Basophils Relative: 1 %
Eosinophils Absolute: 116 cells/uL (ref 15–500)
Eosinophils Relative: 4 %
HEMATOCRIT: 38.2 % (ref 35.0–45.0)
HEMOGLOBIN: 12.6 g/dL (ref 11.7–15.5)
LYMPHS ABS: 928 {cells}/uL (ref 850–3900)
Lymphocytes Relative: 32 %
MCH: 26.9 pg — ABNORMAL LOW (ref 27.0–33.0)
MCHC: 33 g/dL (ref 32.0–36.0)
MCV: 81.4 fL (ref 80.0–100.0)
MONO ABS: 203 {cells}/uL (ref 200–950)
MPV: 10.6 fL (ref 7.5–12.5)
Monocytes Relative: 7 %
NEUTROS PCT: 56 %
Neutro Abs: 1624 cells/uL (ref 1500–7800)
Platelets: 248 10*3/uL (ref 140–400)
RBC: 4.69 MIL/uL (ref 3.80–5.10)
RDW: 15 % (ref 11.0–15.0)
WBC: 2.9 10*3/uL — AB (ref 3.8–10.8)

## 2017-06-11 LAB — BASIC METABOLIC PANEL WITH GFR
BUN: 14 mg/dL (ref 7–25)
CHLORIDE: 103 mmol/L (ref 98–110)
CO2: 30 mmol/L (ref 20–31)
Calcium: 9.8 mg/dL (ref 8.6–10.4)
Creat: 0.77 mg/dL (ref 0.60–0.93)
GFR, EST NON AFRICAN AMERICAN: 78 mL/min (ref >=60)
GFR, Est African American: >89 mL/min (ref >=60)
GLUCOSE: 95 mg/dL (ref 65–99)
POTASSIUM: 4.1 mmol/L (ref 3.5–5.3)
Sodium: 142 mmol/L (ref 135–146)

## 2017-06-11 LAB — LIPID PANEL
CHOL/HDL RATIO: 2.1 ratio (ref <5.0)
Cholesterol: 173 mg/dL (ref <200)
HDL: 84 mg/dL (ref >50)
LDL CALC: 81 mg/dL (ref <100)
Triglycerides: 42 mg/dL (ref <150)
VLDL: 8 mg/dL (ref <30)

## 2017-06-11 LAB — HEPATIC FUNCTION PANEL
ALK PHOS: 69 U/L (ref 33–130)
ALT: 7 U/L (ref 6–29)
AST: 14 U/L (ref 10–35)
Albumin: 4.3 g/dL (ref 3.6–5.1)
BILIRUBIN INDIRECT: 0.4 mg/dL (ref 0.2–1.2)
Bilirubin, Direct: 0.1 mg/dL (ref <=0.2)
TOTAL PROTEIN: 7 g/dL (ref 6.1–8.1)
Total Bilirubin: 0.5 mg/dL (ref 0.2–1.2)

## 2017-06-11 LAB — MAGNESIUM: Magnesium: 1.9 mg/dL (ref 1.5–2.5)

## 2017-06-11 LAB — TSH: TSH: 1.52 m[IU]/L

## 2017-06-11 MED ORDER — PANTOPRAZOLE SODIUM 40 MG PO TBEC: 40.0000 mg | DELAYED_RELEASE_TABLET | Freq: Two times a day (BID) | ORAL | 1 refills | Status: DC

## 2017-06-11 MED ORDER — PREDNISONE 20 MG PO TABS: ORAL_TABLET | ORAL | 1 refills | Status: DC

## 2017-06-11 NOTE — Progress Notes (Signed)
This very nice 71 y.o. [name] presents for 3 month follow up with Hypertension, Hyperlipidemia, Pre-Diabetes and Vitamin D Deficiency. Patient also c/o head allergy sx's with sneezing and itchy watery eyes & nose.      Patient is treated for HTN (circa 1990's) & BP has been controlled at home. Today's BP is at goal -  132/80. Patient has had no complaints of any cardiac type chest pain, palpitations, dyspnea / orthopnea / PND, dizziness, claudication, or dependent edema.     Hyperlipidemia is controlled with diet & meds. Patient denies myalgias or other med SE's. Last Lipids were near goal: Lab Results  Component Value Date   CHOL 193 12/07/2016   HDL 78 12/07/2016   LDLCALC 103 (H) 12/07/2016   TRIG 59 12/07/2016   CHOLHDL 2.5 12/07/2016      Also, the patient has history of PreDiabetes (A1c 5.9% in 2013) and has had no symptoms of reactive hypoglycemia, diabetic polys, paresthesias or visual blurring.  Last A1c was at goal: Lab Results  Component Value Date   HGBA1C 5.6 12/07/2016      Further, the patient also has history of Vitamin D Deficiency ("41" on treatment in 2014)  and supplements vitamin D without any suspected side-effects. Last vitamin D was sl low (goal 70-100): Lab Results  Component Value Date   VD25OH 55 12/07/2016   Current Outpatient Prescriptions on File Prior to Visit  Medication Sig  . aspirin 81 MG tablet Take 81 mg by mouth every evening.   . cetirizine (ZYRTEC) 10 MG tablet Take 10 mg by mouth daily.  . cholecalciferol (VITAMIN D) 1000 units tablet Take 5,000 Units by mouth daily.   . meclizine (ANTIVERT) 25 MG tablet Take 1 tablet (25 mg total) by mouth 3 (three) times daily as needed for dizziness.  . polyethylene glycol (MIRALAX / GLYCOLAX) packet Take 8.5 g by mouth daily.   . simvastatin (ZOCOR) 20 MG tablet Take 20 mg by mouth daily at 6 PM.  . valsartan-hydrochlorothiazide (DIOVAN-HCT) 320-25 MG tablet TAKE ONE TABLET BY MOUTH ONCE DAILY FOR BLOOD  PRESSURE  . vitamin C (ASCORBIC ACID) 500 MG tablet Take 500 mg by mouth daily.  . fluticasone (FLONASE) 50 MCG/ACT nasal spray Place 2 sprays into both nostrils daily. (Patient not taking: Reported on 06/11/2017)   No current facility-administered medications on file prior to visit.    Allergies  Allergen Reactions  . Fosamax [Alendronate Sodium] Itching  . Sulfa Antibiotics Itching  . Tramadol Itching   PMHx:   Past Medical History:  Diagnosis Date  . DJD (degenerative joint disease)   . GERD (gastroesophageal reflux disease)   . Hyperlipidemia   . Hypertension   . Pre-diabetes   . RLS (restless legs syndrome)    Immunization History  Administered Date(s) Administered  . DTaP 12/11/2000  . Influenza Split 10/24/2013  . Influenza, High Dose Seasonal PF 09/17/2014, 09/09/2015, 08/21/2016  . Pneumococcal Conjugate-13 08/21/2016  . Pneumococcal Polysaccharide-23 12/11/2002  . Tdap 07/11/2012  . Zoster 08/06/2013   Past Surgical History:  Procedure Laterality Date  . ANKLE SURGERY    . CHOLECYSTECTOMY    . LEFT HEART CATHETERIZATION WITH CORONARY ANGIOGRAM N/A 07/29/2014   Procedure: LEFT HEART CATHETERIZATION WITH CORONARY ANGIOGRAM;  Surgeon: Burnell Blanks, MD;  Location: [facility] CATH LAB;  Service: Cardiovascular;  Laterality: N/A;  . TUBAL LIGATION     FHx:    Reviewed / unchanged  SHx:    Reviewed /  unchanged  Systems Review:  Constitutional: Denies fever, chills, wt changes, headaches, insomnia, fatigue, night sweats, change in appetite. Eyes: Denies redness, blurred vision, diplopia, discharge, itchy, watery eyes.  ENT: Denies discharge, congestion, post nasal drip, epistaxis, sore throat, earache, hearing loss, dental pain, tinnitus, vertigo, sinus pain, snoring.  CV: Denies chest pain, palpitations, irregular heartbeat, syncope, dyspnea, diaphoresis, orthopnea, PND, claudication or edema. Respiratory: denies cough, dyspnea, DOE, pleurisy, hoarseness,  laryngitis, wheezing.  Gastrointestinal: Denies dysphagia, odynophagia, heartburn, reflux, water brash, abdominal pain or cramps, nausea, vomiting, bloating, diarrhea, constipation, hematemesis, melena, hematochezia  or hemorrhoids. Genitourinary: Denies dysuria, frequency, urgency, nocturia, hesitancy, discharge, hematuria or flank pain. Musculoskeletal: Denies arthralgias, myalgias, stiffness, jt. swelling, pain, limping or strain/sprain.  Skin: Denies pruritus, rash, hives, warts, acne, eczema or change in skin lesion(s). Neuro: No weakness, tremor, incoordination, spasms, paresthesia or pain. Psychiatric: Denies confusion, memory loss or sensory loss. Endo: Denies change in weight, skin or hair change.  Heme/Lymph: No excessive bleeding, bruising or enlarged lymph nodes.  Physical Exam  BP 132/80   Pulse 80   Temp 97.1 F (36.2 C)   Resp 16   Ht 5' 5.5" (1.664 m)   Wt 157 lb 12.8 oz (71.6 kg)   BMI 25.86 kg/m   Appears well nourished, well groomed  and in no distress.  Eyes: PERRLA, EOMs, conjunctiva no swelling or erythema. Sinuses: No frontal/maxillary tenderness ENT/Mouth: EAC's clear, TM's nl w/o erythema, bulging. Nares clear w/o erythema, swelling, exudates. Oropharynx clear without erythema or exudates. Oral hygiene is good. Tongue normal, non obstructing. Hearing intact.  Neck: Supple. Thyroid nl. Car 2+/2+ without bruits, nodes or JVD. Chest: Respirations nl with BS clear & equal w/o rales, rhonchi, wheezing or stridor.  Cor: Heart sounds normal w/ regular rate and rhythm without sig. murmurs, gallops, clicks or rubs. Peripheral pulses normal and equal  without edema.  Abdomen: Soft & bowel sounds normal. Non-tender w/o guarding, rebound, hernias, masses or organomegaly.  Lymphatics: Unremarkable.  Musculoskeletal: Full ROM all peripheral extremities, joint stability, 5/5 strength and normal gait.  Skin: Warm, dry without exposed rashes, lesions or ecchymosis apparent.    Neuro: Cranial nerves intact, reflexes equal bilaterally. Sensory-motor testing grossly intact. Tendon reflexes grossly intact.  Pysch: Alert & oriented x 3.  Insight and judgement nl & appropriate. No ideations.  Assessment and Plan:  1. Essential hypertension  - Continue medication, monitor blood pressure at home.  - Continue DASH diet. Reminder to go to the ER if any CP,  SOB, nausea, dizziness, severe HA, changes vision/speech.  - CBC with Differential/Platelet - BASIC METABOLIC PANEL WITH GFR - Magnesium - TSH  2. Hyperlipidemia, mixed  - Continue diet/meds, exercise,& lifestyle modifications.  - Continue monitor periodic cholesterol/liver & renal functions   - Hepatic function panel - Lipid panel - TSH  3. Prediabetes  - Continue diet, exercise, lifestyle modifications.  - Monitor appropriate labs.  - Hemoglobin A1c - Insulin, random  4. Vitamin D deficiency  - Continue supplementation.  - VITAMIN D 25 Hydroxy  5. Medication management  - CBC with Differential/Platelet - BASIC METABOLIC PANEL WITH GFR - Hepatic function panel - Magnesium - Lipid panel - TSH - Hemoglobin A1c - Insulin, random - VITAMIN D 25 Hydroxy  6. Allergic rhinitis due to pollen, unspecified seasonality  - predniSONE (DELTASONE) 20 MG tablet; 1 tab 3 x day for 2 days, then 1 tab 2 x day for 2 days, then 1 tab 1 x day for 3 days  Dispense: 13 tablet; Refill: 1       Discussed  regular exercise, BP monitoring, weight control to achieve/maintain BMI less than 25 and discussed med and SE's. Recommended labs to assess and monitor clinical status with further disposition pending results of labs. Over 30 minutes of exam, counseling, chart review was performed.

## 2017-06-11 NOTE — Patient Instructions (Signed)

## 2017-06-12 LAB — INSULIN, RANDOM: Insulin: 5.1 u[IU]/mL (ref 2.0–19.6)

## 2017-06-12 LAB — VITAMIN D 25 HYDROXY (VIT D DEFICIENCY, FRACTURES): Vit D, 25-Hydroxy: 67 ng/mL (ref 30–100)

## 2017-06-12 LAB — HEMOGLOBIN A1C
HEMOGLOBIN A1C: 5.8 % — AB (ref <5.7)
MEAN PLASMA GLUCOSE: 120 mg/dL

## 2017-07-03 ENCOUNTER — Encounter: Payer: Self-pay | Admitting: Internal Medicine

## 2017-07-26 ENCOUNTER — Ambulatory Visit (INDEPENDENT_AMBULATORY_CARE_PROVIDER_SITE_OTHER): Payer: Medicare Other | Admitting: Physician Assistant

## 2017-07-26 ENCOUNTER — Ambulatory Visit
Admission: RE | Admit: 2017-07-26 | Discharge: 2017-07-26 | Disposition: A | Discharge status: Home / Self Care | Payer: Medicare Other | Source: Ambulatory Visit | Attending: Physician Assistant | Admitting: Physician Assistant

## 2017-07-26 VITALS — BP 140/100 | HR 83 | Temp 97.7°F | Resp 14 | Ht 65.5 in | Wt 157.0 lb

## 2017-07-26 DIAGNOSIS — R350 Frequency of micturition: Secondary | ICD-10-CM

## 2017-07-26 DIAGNOSIS — R51 Headache: Principal | ICD-10-CM

## 2017-07-26 DIAGNOSIS — I10 Essential (primary) hypertension: Secondary | ICD-10-CM | POA: Diagnosis not present

## 2017-07-26 DIAGNOSIS — R531 Weakness: Secondary | ICD-10-CM

## 2017-07-26 DIAGNOSIS — R519 Headache, unspecified: Secondary | ICD-10-CM

## 2017-07-26 DIAGNOSIS — Z136 Encounter for screening for cardiovascular disorders: Secondary | ICD-10-CM | POA: Diagnosis not present

## 2017-07-26 DIAGNOSIS — H5500 Unspecified nystagmus: Secondary | ICD-10-CM

## 2017-07-26 DIAGNOSIS — D649 Anemia, unspecified: Secondary | ICD-10-CM

## 2017-07-26 MED ORDER — TELMISARTAN 80 MG PO TABS: 80.0000 mg | ORAL_TABLET | Freq: Every day | ORAL | 1 refills | Status: DC

## 2017-07-26 MED ORDER — LEVOFLOXACIN 500 MG PO TABS: 500.0000 mg | ORAL_TABLET | Freq: Every day | ORAL | 0 refills | Status: DC

## 2017-07-26 NOTE — Progress Notes (Signed)
Subjective:    Patient ID: Emily Jenkins, female    DOB: 02-23-1946, 71 y.o.   MRN: [id number]  HPI 71 y.o. AAF presents with multitude of symptoms.  She had an OV 1 month ago, has been following Dr. Lorin Mercy for lower back pain and neck pain.  She has been having HA, decreased appetite.  She has had vision changes, hard reading. Has had imbalance, fell Friday, has some trouble swallowing water, has had EGD. No nausea or vomiting. She also complains of some SOB with exertion x last year, SOB with talking.  She was treated for sinuses in Feb, and she was given prednisone 1 month ago in July.  Recently saw her eye doctor 1 month ago, no issues but glasses changed.  She was on diovan until 2 weeks ago when it was recalled, has not started a new one.  Feels left ear pain, feels throat is dry.   Had normal CT AB 03/2016 MRI back 04/2017 Normal stress test 2015.  Blood pressure (!) 158/100, pulse 83, temperature 97.7 F (36.5 C), resp. rate 14, height 5' 5.5" (1.664 m), weight 157 lb (71.2 kg), SpO2 97 %.  Medications Current Outpatient Prescriptions on File Prior to Visit  Medication Sig  . aspirin 81 MG tablet Take 81 mg by mouth every evening.   . cetirizine (ZYRTEC) 10 MG tablet Take 10 mg by mouth daily.  . cholecalciferol (VITAMIN D) 1000 units tablet Take 5,000 Units by mouth daily.   . pantoprazole (PROTONIX) 40 MG tablet Take 1 tablet (40 mg total) by mouth 2 (two) times daily before a meal.  . polyethylene glycol (MIRALAX / GLYCOLAX) packet Take 8.5 g by mouth daily.   . simvastatin (ZOCOR) 20 MG tablet Take 20 mg by mouth daily at 6 PM.  . vitamin C (ASCORBIC ACID) 500 MG tablet Take 500 mg by mouth daily.   No current facility-administered medications on file prior to visit.     Problem list She has Varicose veins; Essential hypertension; Mixed hyperlipidemia; Prediabetes; Vitamin D deficiency; Medication management; GERD ; Osteopenia; and BMI 26.0-26.9,adult on her  problem list.   Review of Systems  Constitutional: Positive for fatigue. Negative for activity change, appetite change, chills, diaphoresis, fever and unexpected weight change.  HENT: Positive for congestion, sinus pain and sinus pressure. Negative for dental problem, drooling, ear discharge, ear pain, facial swelling, hearing loss and postnasal drip.   Eyes: Positive for visual disturbance. Negative for photophobia, pain, discharge, redness and itching.  Respiratory: Negative.   Cardiovascular: Negative.   Gastrointestinal: Negative.   Genitourinary: Positive for frequency.  Musculoskeletal: Positive for back pain. Negative for arthralgias, gait problem, joint swelling, myalgias, neck pain and neck stiffness.  Skin: Negative.   Neurological: Positive for dizziness and headaches. Negative for tremors, seizures, syncope, facial asymmetry, speech difficulty, weakness, light-headedness and numbness.  Hematological: Negative.   Psychiatric/Behavioral: Negative.        Objective:   Physical Exam  Constitutional: She is oriented to person, place, and time. She appears well-developed and well-nourished. No distress.  HENT:  Head: Normocephalic and atraumatic.  Right Ear: External ear normal.  Left Ear: External ear normal.  Mouth/Throat: Oropharynx is clear and moist. No oropharyngeal exudate.  Eyes: Pupils are equal, round, and reactive to light. Conjunctivae are normal. Right eye exhibits nystagmus. Right eye exhibits normal extraocular motion. Left eye exhibits nystagmus. Left eye exhibits normal extraocular motion.  Neck: Normal range of motion. Neck supple.  Cardiovascular: Normal rate  and regular rhythm.   No murmur heard. Pulmonary/Chest: Effort normal and breath sounds normal.  Abdominal: Soft. Bowel sounds are normal. There is no tenderness.  Musculoskeletal: Normal range of motion. She exhibits no tenderness.  Antalgic gait  Lymphadenopathy:    She has no cervical adenopathy.   Neurological: She is alert and oriented to person, place, and time. No cranial nerve deficit. Coordination abnormal.  Skin: Skin is warm and dry. No rash noted.       Assessment & Plan:   Nonintractable headache, unspecified chronicity pattern, unspecified headache type -     telmisartan (MICARDIS) 80 MG tablet; Take 1 tablet (80 mg total) by mouth daily. -     CT Head Wo Contrast; Future -  With elevated BP, nystagmus and Ha will send for CT  Urinary frequency -     Urinalysis, Routine w reflex microscopic -     Urine Culture -     levofloxacin (LEVAQUIN) 500 MG tablet; Take 1 tablet (500 mg total) by mouth daily.  Weakness -     CBC with Differential/Platelet -     BASIC METABOLIC PANEL WITH GFR -     Hepatic function panel -     TSH -     Iron,Total/Total Iron Binding Cap -     Ferritin -     Vitamin B12 -     levofloxacin (LEVAQUIN) 500 MG tablet; Take 1 tablet (500 mg total) by mouth daily. -     EKG 12-Lead  Anemia, unspecified type -     Iron,Total/Total Iron Binding Cap -     Ferritin -     Vitamin B12  Hypertension, unspecified type -     telmisartan (MICARDIS) 80 MG tablet; Take 1 tablet (80 mg total) by mouth daily.

## 2017-07-26 NOTE — Progress Notes (Signed)
Pt aware of lab results & voiced understanding of those results.

## 2017-07-26 NOTE — Patient Instructions (Addendum)
Take the antibiotic Please do not drive Will get CT head  No not take prednisone Get on prilosec over the counter 20mg  in the morning 30 mins before food Can take zantac at night  Start on micardis 80 mg, monitor BP  if worsening HA, changes vision/speech, imbalance, weakness go to the ER   General Headache Without Cause A headache is pain or discomfort felt around the head or neck area. The specific cause of a headache may not be found. There are many causes and types of headaches. A few common ones are:  Tension headaches.  Migraine headaches.  Cluster headaches.  Chronic daily headaches.  Follow these instructions at home: Watch your condition for any changes. Take these steps to help with your condition: Managing pain  Take over-the-counter and prescription medicines only as told by your health care provider.  Lie down in a dark, quiet room when you have a headache.  If directed, apply ice to the head and neck area: ? Put ice in a plastic bag. ? Place a towel between your skin and the bag. ? Leave the ice on for 20 minutes, 2-3 times per day.  Use a heating pad or hot shower to apply heat to the head and neck area as told by your health care provider.  Keep lights dim if bright lights bother you or make your headaches worse. Eating and drinking  Eat meals on a regular schedule.  Limit alcohol use.  Decrease the amount of caffeine you drink, or stop drinking caffeine. General instructions  Keep all follow-up visits as told by your health care provider. This is important.  Keep a headache journal to help find out what may trigger your headaches. For example, write down: ? What you eat and drink. ? How much sleep you get. ? Any change to your diet or medicines.  Try massage or other relaxation techniques.  Limit stress.  Sit up straight, and do not tense your muscles.  Do not use tobacco products, including cigarettes, chewing tobacco, or e-cigarettes.  If you need help quitting, ask your health care provider.  Exercise regularly as told by your health care provider.  Sleep on a regular schedule. Get 7-9 hours of sleep, or the amount recommended by your health care provider. Contact a health care provider if:  Your symptoms are not helped by medicine.  You have a headache that is different from the usual headache.  You have nausea or you vomit.  You have a fever. Get help right away if:  Your headache becomes severe.  You have repeated vomiting.  You have a stiff neck.  You have a loss of vision.  You have problems with speech.  You have pain in the eye or ear.  You have muscular weakness or loss of muscle control.  You lose your balance or have trouble walking.  You feel faint or pass out.  You have confusion. This information is not intended to replace advice given to you by your health care provider. Make sure you discuss any questions you have with your health care provider. Document Released: 11/27/2005 Document Revised: 05/04/2016 Document Reviewed: 03/22/2015 Elsevier Interactive Patient Education  2017 [employer].

## 2017-07-27 LAB — URINE CULTURE
MICRO NUMBER: 80889132
RESULT: NO GROWTH
SPECIMEN QUALITY:: ADEQUATE

## 2017-07-27 LAB — URINALYSIS, ROUTINE W REFLEX MICROSCOPIC
BILIRUBIN URINE: NEGATIVE
GLUCOSE, UA: NEGATIVE
Hgb urine dipstick: NEGATIVE
Ketones, ur: NEGATIVE
Leukocytes, UA: NEGATIVE
Nitrite: NEGATIVE
Protein, ur: NEGATIVE
SPECIFIC GRAVITY, URINE: 1.016 (ref 1.001–1.03)
pH: 6.5 (ref 5.0–8.0)

## 2017-07-27 LAB — CBC WITH DIFFERENTIAL/PLATELET
BASOS ABS: 20 {cells}/uL (ref 0–200)
Basophils Relative: 0.7 %
EOS PCT: 2.5 %
Eosinophils Absolute: 70 cells/uL (ref 15–500)
HCT: 37.6 % (ref 35.0–45.0)
HEMOGLOBIN: 12.4 g/dL (ref 11.7–15.5)
Lymphs Abs: 759 cells/uL — ABNORMAL LOW (ref 850–3900)
MCH: 27 pg (ref 27.0–33.0)
MCHC: 33 g/dL (ref 32.0–36.0)
MCV: 81.7 fL (ref 80.0–100.0)
MONOS PCT: 8.5 %
MPV: 11.3 fL (ref 7.5–12.5)
NEUTROS ABS: 1714 {cells}/uL (ref 1500–7800)
Neutrophils Relative %: 61.2 %
Platelets: 254 10*3/uL (ref 140–400)
RBC: 4.6 10*6/uL (ref 3.80–5.10)
RDW: 13.6 % (ref 11.0–15.0)
Total Lymphocyte: 27.1 %
WBC mixed population: 238 cells/uL (ref 200–950)
WBC: 2.8 10*3/uL — ABNORMAL LOW (ref 3.8–10.8)

## 2017-07-27 LAB — BASIC METABOLIC PANEL WITH GFR
BUN: 10 mg/dL (ref 7–25)
CHLORIDE: 105 mmol/L (ref 98–110)
CO2: 26 mmol/L (ref 20–32)
Calcium: 9.6 mg/dL (ref 8.6–10.4)
Creat: 0.71 mg/dL (ref 0.60–0.93)
GFR, Est African American: 99 mL/min/{1.73_m2} (ref >=60)
GFR, Est Non African American: 86 mL/min/{1.73_m2} (ref >=60)
GLUCOSE: 91 mg/dL (ref 65–99)
POTASSIUM: 4.1 mmol/L (ref 3.5–5.3)
SODIUM: 142 mmol/L (ref 135–146)

## 2017-07-27 LAB — HEPATIC FUNCTION PANEL
AG Ratio: 1.9 (calc) (ref 1.0–2.5)
ALKALINE PHOSPHATASE (APISO): 71 U/L (ref 33–130)
ALT: 7 U/L (ref 6–29)
AST: 15 U/L (ref 10–35)
Albumin: 4.3 g/dL (ref 3.6–5.1)
Bilirubin, Direct: 0.1 mg/dL (ref 0.0–0.2)
Globulin: 2.3 g/dL (calc) (ref 1.9–3.7)
Indirect Bilirubin: 0.4 mg/dL (calc) (ref 0.2–1.2)
Total Bilirubin: 0.5 mg/dL (ref 0.2–1.2)
Total Protein: 6.6 g/dL (ref 6.1–8.1)

## 2017-07-27 LAB — IRON, TOTAL/TOTAL IRON BINDING CAP
%SAT: 16 % (calc) (ref 11–50)
IRON: 53 ug/dL (ref 45–160)
TIBC: 326 ug/dL (ref 250–450)

## 2017-07-27 LAB — FERRITIN: Ferritin: 53 ng/mL (ref 20–288)

## 2017-07-27 LAB — TSH: TSH: 1.64 mIU/L (ref 0.40–4.50)

## 2017-07-27 LAB — EXTRA URINE SPECIMEN

## 2017-07-27 LAB — VITAMIN B12: Vitamin B-12: 960 pg/mL (ref 200–1100)

## 2017-07-30 NOTE — Progress Notes (Signed)
Pt aware of lab results & voiced understanding of those results. Pt states she will call to try & get an appt with Dr. Collene Mares, pt will call back if she can't get in with his office.

## 2017-08-15 ENCOUNTER — Other Ambulatory Visit: Payer: Self-pay | Admitting: Internal Medicine

## 2017-09-07 ENCOUNTER — Ambulatory Visit: Payer: Self-pay | Admitting: Physician Assistant

## 2017-09-10 ENCOUNTER — Emergency Department (HOSPITAL_COMMUNITY)
Admission: EM | Admit: 2017-09-10 | Discharge: 2017-09-10 | Disposition: A | Discharge status: Home / Self Care | Payer: Medicare Other | Attending: Emergency Medicine | Admitting: Emergency Medicine

## 2017-09-10 ENCOUNTER — Emergency Department (HOSPITAL_COMMUNITY): Payer: Medicare Other

## 2017-09-10 ENCOUNTER — Other Ambulatory Visit: Payer: Self-pay

## 2017-09-10 ENCOUNTER — Encounter (HOSPITAL_COMMUNITY): Payer: Self-pay | Admitting: *Deleted

## 2017-09-10 DIAGNOSIS — I10 Essential (primary) hypertension: Secondary | ICD-10-CM | POA: Diagnosis not present

## 2017-09-10 DIAGNOSIS — Z7982 Long term (current) use of aspirin: Secondary | ICD-10-CM | POA: Insufficient documentation

## 2017-09-10 DIAGNOSIS — J181 Lobar pneumonia, unspecified organism: Secondary | ICD-10-CM | POA: Insufficient documentation

## 2017-09-10 DIAGNOSIS — M791 Myalgia, unspecified site: Secondary | ICD-10-CM | POA: Diagnosis present

## 2017-09-10 DIAGNOSIS — J189 Pneumonia, unspecified organism: Secondary | ICD-10-CM

## 2017-09-10 LAB — COMPREHENSIVE METABOLIC PANEL
ALBUMIN: 3.5 g/dL (ref 3.5–5.0)
ALT: 37 U/L (ref 14–54)
ANION GAP: 12 (ref 5–15)
AST: 50 U/L — ABNORMAL HIGH (ref 15–41)
Alkaline Phosphatase: 119 U/L (ref 38–126)
BILIRUBIN TOTAL: 0.7 mg/dL (ref 0.3–1.2)
BUN: 10 mg/dL (ref 6–20)
CHLORIDE: 104 mmol/L (ref 101–111)
CO2: 26 mmol/L (ref 22–32)
Calcium: 9.7 mg/dL (ref 8.9–10.3)
Creatinine, Ser: 0.72 mg/dL (ref 0.44–1.00)
GFR calc Af Amer: >60 mL/min (ref >60)
GFR calc non Af Amer: >60 mL/min (ref >60)
GLUCOSE: 106 mg/dL — AB (ref 65–99)
POTASSIUM: 3.4 mmol/L — AB (ref 3.5–5.1)
SODIUM: 142 mmol/L (ref 135–145)
TOTAL PROTEIN: 8.3 g/dL — AB (ref 6.5–8.1)

## 2017-09-10 LAB — CBC WITH DIFFERENTIAL/PLATELET
BASOS ABS: 0 10*3/uL (ref 0.0–0.1)
Basophils Relative: 0 %
Eosinophils Absolute: 0 10*3/uL (ref 0.0–0.7)
Eosinophils Relative: 1 %
HEMATOCRIT: 35.2 % — AB (ref 36.0–46.0)
Hemoglobin: 11.9 g/dL — ABNORMAL LOW (ref 12.0–15.0)
LYMPHS ABS: 0.8 10*3/uL (ref 0.7–4.0)
LYMPHS PCT: 12 %
MCH: 26.6 pg (ref 26.0–34.0)
MCHC: 33.8 g/dL (ref 30.0–36.0)
MCV: 78.7 fL (ref 78.0–100.0)
MONO ABS: 0.3 10*3/uL (ref 0.1–1.0)
Monocytes Relative: 5 %
NEUTROS ABS: 5.3 10*3/uL (ref 1.7–7.7)
Neutrophils Relative %: 82 %
Platelets: 333 10*3/uL (ref 150–400)
RBC: 4.47 MIL/uL (ref 3.87–5.11)
RDW: 13.5 % (ref 11.5–15.5)
WBC: 6.4 10*3/uL (ref 4.0–10.5)

## 2017-09-10 LAB — URINALYSIS, ROUTINE W REFLEX MICROSCOPIC
Bilirubin Urine: NEGATIVE
Glucose, UA: NEGATIVE mg/dL
Hgb urine dipstick: NEGATIVE
KETONES UR: NEGATIVE mg/dL
Leukocytes, UA: NEGATIVE
Nitrite: NEGATIVE
PROTEIN: 30 mg/dL — AB
Specific Gravity, Urine: 1.017 (ref 1.005–1.030)
pH: 5 (ref 5.0–8.0)

## 2017-09-10 LAB — CG4 I-STAT (LACTIC ACID)
LACTIC ACID, VENOUS: 0.97 mmol/L (ref 0.5–1.9)
LACTIC ACID, VENOUS: 0.71 mmol/L (ref 0.5–1.9)

## 2017-09-10 MED ORDER — ACETAMINOPHEN 325 MG PO TABS
650.0000 mg | ORAL_TABLET | Freq: Once | ORAL | Status: AC
  Administered 2017-09-10: 650 mg via ORAL
  Filled 2017-09-10: qty 2

## 2017-09-10 MED ORDER — AZITHROMYCIN 500 MG IV SOLR
500.0000 mg | Freq: Once | INTRAVENOUS | Status: AC
  Administered 2017-09-10: 500 mg via INTRAVENOUS
  Filled 2017-09-10: qty 500

## 2017-09-10 MED ORDER — AZITHROMYCIN 250 MG PO TABS: 250.0000 mg | ORAL_TABLET | Freq: Every day | ORAL | 0 refills | Status: DC

## 2017-09-10 MED ORDER — CEFTRIAXONE SODIUM 1 G IJ SOLR
1.0000 g | Freq: Once | INTRAMUSCULAR | Status: AC
  Administered 2017-09-10: 1 g via INTRAVENOUS
  Filled 2017-09-10: qty 10

## 2017-09-10 MED ORDER — SODIUM CHLORIDE 0.9 % IV BOLUS (SEPSIS)
1000.0000 mL | Freq: Once | INTRAVENOUS | Status: AC
  Administered 2017-09-10: 1000 mL via INTRAVENOUS

## 2017-09-10 NOTE — ED Triage Notes (Signed)
Fever, body aches and chills for several days

## 2017-09-10 NOTE — ED Provider Notes (Signed)
[facility] DEPT Provider Note   CSN: [id number] Arrival date & time: 09/10/17  1330     History   Chief Complaint Chief Complaint  Patient presents with  . Generalized Body Aches    HPI Emily Jenkins is a 71 y.o. female.  Patient is a 71 year old female with a history of diabetes, hypertension, hyperlipidemia presenting today with one week of worsening fever, chills, nonproductive cough, generalized myalgias and decreased appetite. Patient normally walks with a cane and states she is had a harder time walking because she has felt slightly dizzy. She denies any nausea, vomiting, abdominal pain or diarrhea. Today when she urinated she had mild burning.  She denies any known sick contacts. She has not had a flu shot this year. She has no history of lung disease. She is not a smoker.   The history is provided by the patient.    Past Medical History:  Diagnosis Date  . DJD (degenerative joint disease)   . GERD (gastroesophageal reflux disease)   . Hyperlipidemia   . Hypertension   . Pre-diabetes   . RLS (restless legs syndrome)     Patient Active Problem List   Diagnosis Date Noted  . BMI 26.0-26.9,adult 10/29/2015  . Osteopenia 06/07/2015  . GERD  03/04/2015  . Medication management 01/05/2014  . Essential hypertension 12/02/2013  . Mixed hyperlipidemia 12/02/2013  . Prediabetes 12/02/2013  . Vitamin D deficiency 12/02/2013  . Varicose veins 09/16/2012    Past Surgical History:  Procedure Laterality Date  . ANKLE SURGERY    . CHOLECYSTECTOMY    . LEFT HEART CATHETERIZATION WITH CORONARY ANGIOGRAM N/A 07/29/2014   Procedure: LEFT HEART CATHETERIZATION WITH CORONARY ANGIOGRAM;  Surgeon: Burnell Blanks, MD;  Location: [facility] CATH LAB;  Service: Cardiovascular;  Laterality: N/A;  . TUBAL LIGATION      OB History    No data available       Home Medications    Prior to Admission medications   Medication Sig Start Date End Date Taking? Authorizing  Provider  aspirin 81 MG tablet Take 81 mg by mouth every evening.     [provider]  cetirizine (ZYRTEC) 10 MG tablet Take 10 mg by mouth daily.    [provider]  cholecalciferol (VITAMIN D) 1000 units tablet Take 5,000 Units by mouth daily.     [provider]  levofloxacin (LEVAQUIN) 500 MG tablet Take 1 tablet (500 mg total) by mouth daily. 07/26/17   Vicie Mutters, PA-C  pantoprazole (PROTONIX) 40 MG tablet Take 1 tablet (40 mg total) by mouth 2 (two) times daily before a meal. 06/11/17 06/11/18  Unk Pinto, MD  polyethylene glycol (MIRALAX / Floria Raveling) packet Take 8.5 g by mouth daily.     [provider]  simvastatin (ZOCOR) 20 MG tablet Take 20 mg by mouth daily at 6 PM.    [provider]  simvastatin (ZOCOR) 20 MG tablet TAKE ONE TABLET BY MOUTH ONCE DAILY AT BEDTIME 08/15/17   Unk Pinto, MD  telmisartan (MICARDIS) 80 MG tablet Take 1 tablet (80 mg total) by mouth daily. 07/26/17   Vicie Mutters, PA-C  vitamin C (ASCORBIC ACID) 500 MG tablet Take 500 mg by mouth daily.    [provider]    Family History Family History  Problem Relation Age of Onset  . Heart disease Mother 43       stent  . Hypertension Mother   . Cancer Father   . Diabetes Father   .  Hyperlipidemia Sister   . Hypertension Sister     Social History Social History  Substance Use Topics  . Smoking status: Never Smoker  . Smokeless tobacco: Never Used     Comment: SPOUSE SMOKES  . Alcohol use No     Allergies   Fosamax [alendronate sodium]; Sulfa antibiotics; and Tramadol   Review of Systems Review of Systems  All other systems reviewed and are negative.    Physical Exam Updated Vital Signs BP (!) 158/84   Pulse (!) 130   Temp 99.4 F (37.4 C) (Oral)   Resp 20   Ht 5' 5.5" (1.664 m)   Wt 70.3 kg (155 lb)   SpO2 100%   BMI 25.40 kg/m   Physical Exam  Constitutional: She is oriented to person, place, and time. She appears  well-developed and well-nourished. No distress.  HENT:  Head: Normocephalic and atraumatic.  Mouth/Throat: Oropharynx is clear and moist.  Eyes: Pupils are equal, round, and reactive to light. Conjunctivae and EOM are normal.  Neck: Normal range of motion. Neck supple.  Cardiovascular: Normal rate, regular rhythm and intact distal pulses.   No murmur heard. Pulmonary/Chest: Effort normal and breath sounds normal. No respiratory distress. She has no wheezes. She has no rales.  Abdominal: Soft. She exhibits no distension. There is no tenderness. There is no rebound and no guarding.  Musculoskeletal: Normal range of motion. She exhibits no edema or tenderness.  Neurological: She is alert and oriented to person, place, and time.  Skin: Skin is warm and dry. No rash noted. No erythema.  Psychiatric: She has a normal mood and affect. Her behavior is normal.  Nursing note and vitals reviewed.    ED Treatments / Results  Labs (all labs ordered are listed, but only abnormal results are displayed) Labs Reviewed  COMPREHENSIVE METABOLIC PANEL - Abnormal; Notable for the following:       Result Value   Potassium 3.4 (*)    Glucose, Bld 106 (*)    Total Protein 8.3 (*)    AST 50 (*)    All other components within normal limits  CBC WITH DIFFERENTIAL/PLATELET - Abnormal; Notable for the following:    Hemoglobin 11.9 (*)    HCT 35.2 (*)    All other components within normal limits  URINALYSIS, ROUTINE W REFLEX MICROSCOPIC - Abnormal; Notable for the following:    APPearance HAZY (*)    Protein, ur 30 (*)    Bacteria, UA RARE (*)    Squamous Epithelial / LPF 0-5 (*)    All other components within normal limits  RESPIRATORY PANEL BY PCR  I-STAT CG4 LACTIC ACID, ED  CG4 I-STAT (LACTIC ACID)  I-STAT CG4 LACTIC ACID, ED    EKG  EKG Interpretation None       Radiology Dg Chest 2 View  Result Date: 09/10/2017 CLINICAL DATA:  Cough fever and body aches EXAM: CHEST  2 VIEW COMPARISON:   09/04/2016 FINDINGS: Ground-glass opacity at the right lower lobe. No large effusion. Left lung is clear. Normal heart size. No pneumothorax. IMPRESSION: Ground-glass opacity at the right lower lobe suspicious for a pneumonia. Radiographic follow-up to resolution is recommended Electronically Signed   By: Donavan Foil M.D.   On: 09/10/2017 15:35    Procedures Procedures (including critical care time)  Medications Ordered in ED Medications  sodium chloride 0.9 % bolus 1,000 mL (1,000 mLs Intravenous New Bag/Given 09/10/17 1906)  cefTRIAXone (ROCEPHIN) 1 g in dextrose 5 % 50 mL IVPB (  1 g Intravenous New Bag/Given 09/10/17 1906)  azithromycin (ZITHROMAX) 500 mg in dextrose 5 % 250 mL IVPB (not administered)     Initial Impression / Assessment and Plan / ED Course  I have reviewed the triage vital signs and the nursing notes.  Pertinent labs & imaging results that were available during my care of the patient were reviewed by me and considered in my medical decision making (see chart for details).    Patient presenting with URI symptoms including fever, cough. Vital signs are stable and she is not in extremis. Oxygen saturation of 100%. Chest x-ray showing groundglass opacity at the right lower lobe suspicious for pneumonia. This is consistent the patient's symptoms. Patient given IV fluids, Tylenol, Rocephin and azithromycin for community-acquired pneumonia. Labs are pending.  9:06 PM Labs reassuring.  Port score of 62 making her risk class of II for age and outpt treatment reasonable.  Will ambulate pt to ensure she is safe for d/c without drop in sats.  Final Clinical Impressions(s) / ED Diagnoses   Final diagnoses:  Community acquired pneumonia of right lower lobe of lung (HCC)    New Prescriptions New Prescriptions   AZITHROMYCIN (ZITHROMAX) 250 MG TABLET    Take 1 tablet (250 mg total) by mouth daily. Take 1 every day until finished.     Blanchie Dessert, MD 09/10/17 2131

## 2017-09-10 NOTE — ED Notes (Signed)
ED Provider at bedside. 

## 2017-09-11 LAB — RESPIRATORY PANEL BY PCR
Adenovirus: NOT DETECTED
BORDETELLA PERTUSSIS-RVPCR: NOT DETECTED
CHLAMYDOPHILA PNEUMONIAE-RVPPCR: NOT DETECTED
Coronavirus 229E: NOT DETECTED
Coronavirus HKU1: NOT DETECTED
Coronavirus NL63: NOT DETECTED
Coronavirus OC43: NOT DETECTED
INFLUENZA A-RVPPCR: NOT DETECTED
Influenza B: NOT DETECTED
METAPNEUMOVIRUS-RVPPCR: NOT DETECTED
Mycoplasma pneumoniae: NOT DETECTED
PARAINFLUENZA VIRUS 2-RVPPCR: NOT DETECTED
PARAINFLUENZA VIRUS 3-RVPPCR: NOT DETECTED
PARAINFLUENZA VIRUS 4-RVPPCR: NOT DETECTED
Parainfluenza Virus 1: NOT DETECTED
RHINOVIRUS / ENTEROVIRUS - RVPPCR: NOT DETECTED
Respiratory Syncytial Virus: NOT DETECTED

## 2017-09-13 NOTE — Progress Notes (Signed)
Assessment and Plan:  Emily Jenkins was seen today for follow-up.  Diagnoses and all orders for this visit:  Pneumonia of right lower lobe due to infectious organism ([place]) -     levofloxacin (LEVAQUIN) 750 MG tablet; Take 1 tablet (750 mg total) by mouth daily. -     CBC with Differential/Platelet -     BASIC METABOLIC PANEL WITH GFR  Hypokalemia -     BASIC METABOLIC PANEL WITH GFR  Patient with continued symptoms of pneumonia, but remains appropriate for outpatient treatment with CURB65 score of 1. She does have neighbors/family members checking on her frequently. Information provided; encouraged her to push fluids, alternate with gatorade or other sports drink. ER precautions provided, she will follow up in a week at her regular 3 month visit.   Further disposition pending results of labs. Discussed med's effects and SE's.   Over 30 minutes of exam, counseling, chart review, and critical decision making was performed.   Future Appointments Date Time Provider [place]  09/21/2017 11:00 AM Vicie Mutters, PA-C [facility] None  01/08/2018 9:00 AM Unk Pinto, MD [facility] None    ------------------------------------------------------------------------------------------------------------------   HPI BP 132/80   Temp (!) 97.5 F (36.4 C)   Resp 16   Ht 5' 5.5" (1.664 m)   Wt 152 lb 9.6 oz (69.2 kg)   SpO2 98%   BMI 25.01 kg/m   71 y.o.female presents following up from an ER visit on 09/10/2017 at which she was diagnosed with RLL pneumonia, and was determined to be appropriate for outpatient treatment and discharged for with azithromycin 250 mg x 4 days. She follows up today reporting she continues to feel unwell, and not any better: she reports she is having continued chills, sweating at night, endorses fatigue/malaise, difficulty taking a deep breath, right lower chest soreness, continued cough.   CURB65- 1 - rechecking labs today for dehydration.   09/10/2017 CXR:  Ground-glass opacity at the right lower lobe. No large effusion. Left lung is clear. Normal heart size. No pneumothorax.  Not a smoker. Reports negative PPD in past, unable to verify in system. Has had pneumonia vaccination, not vaccinated for flu this season yet.   CBC    Component Value Date/Time   WBC 6.4 09/10/2017 1750   RBC 4.47 09/10/2017 1750   HGB 11.9 (L) 09/10/2017 1750   HCT 35.2 (L) 09/10/2017 1750   PLT 333 09/10/2017 1750   MCV 78.7 09/10/2017 1750   MCH 26.6 09/10/2017 1750   MCHC 33.8 09/10/2017 1750   RDW 13.5 09/10/2017 1750   LYMPHSABS 0.8 09/10/2017 1750   MONOABS 0.3 09/10/2017 1750   EOSABS 0.0 09/10/2017 1750   BASOSABS 0.0 09/10/2017 1750   CMP     Component Value Date/Time   NA 142 09/10/2017 1750   K 3.4 (L) 09/10/2017 1750   CL 104 09/10/2017 1750   CO2 26 09/10/2017 1750   GLUCOSE 106 (H) 09/10/2017 1750   BUN 10 09/10/2017 1750   CREATININE 0.72 09/10/2017 1750   CREATININE 0.71 07/26/2017 1021   CALCIUM 9.7 09/10/2017 1750   PROT 8.3 (H) 09/10/2017 1750   ALBUMIN 3.5 09/10/2017 1750   AST 50 (H) 09/10/2017 1750   ALT 37 09/10/2017 1750   ALKPHOS 119 09/10/2017 1750   BILITOT 0.7 09/10/2017 1750   GFRNONAA >60 09/10/2017 1750   GFRNONAA 86 07/26/2017 1021   GFRAA >60 09/10/2017 1750   GFRAA 99 07/26/2017 1021      Past Medical History:  Diagnosis  Date  . DJD (degenerative joint disease)   . GERD (gastroesophageal reflux disease)   . Hyperlipidemia   . Hypertension   . Pre-diabetes   . RLS (restless legs syndrome)      Allergies  Allergen Reactions  . Fosamax [Alendronate Sodium] Itching  . Sulfa Antibiotics Itching  . Tramadol Itching    Current Outpatient Prescriptions on File Prior to Visit  Medication Sig  . aspirin 81 MG tablet Take 81 mg by mouth every evening.   Marland Kitchen azithromycin (ZITHROMAX) 250 MG tablet Take 1 tablet (250 mg total) by mouth daily. Take 1 every day until finished.  . cholecalciferol (VITAMIN D)  1000 units tablet Take 5,000 Units by mouth daily.   . pantoprazole (PROTONIX) 40 MG tablet Take 1 tablet (40 mg total) by mouth 2 (two) times daily before a meal.  . polyethylene glycol (MIRALAX / GLYCOLAX) packet Take 17 g by mouth daily.   . pseudoephedrine-acetaminophen (TYLENOL SINUS) 30-500 MG TABS tablet Take 1 tablet by mouth every 4 (four) hours as needed.  . simvastatin (ZOCOR) 20 MG tablet Take 20 mg by mouth daily at 6 PM.  . telmisartan (MICARDIS) 80 MG tablet Take 1 tablet (80 mg total) by mouth daily.  . vitamin C (ASCORBIC ACID) 500 MG tablet Take 500 mg by mouth daily.   No current facility-administered medications on file prior to visit.     ROS: all negative except above.   Physical Exam:  BP 132/80   Temp (!) 97.5 F (36.4 C)   Resp 16   Ht 5' 5.5" (1.664 m)   Wt 152 lb 9.6 oz (69.2 kg)   SpO2 98%   BMI 25.01 kg/m   General Appearance: Well nourished, appears to be feeling unwell but in no acute distress. Eyes: PERRLA, conjunctiva no swelling or erythema Sinuses: No Frontal/maxillary tenderness ENT/Mouth: Ext aud canals clear, TMs without erythema, bulging, R sided effusion present. No erythema, swelling, or exudate on post pharynx.  Tonsils not swollen or erythematous. Hearing normal.  Neck: Supple.  Respiratory: Respiratory effort normal, BS with scattered rales, without rhonchi, wheezing or stridor, RLL sounds somewhat diminished compared to L.  Cardio: RRR with no MRGs. Brisk peripheral pulses without edema.  Abdomen: Soft, + BS.  Non tender, no guarding, rebound, hernias, masses. Lymphatics: Non tender without lymphadenopathy.  Musculoskeletal: Slow gait assisted by cane.  Skin: Warm, dry without rashes, lesions, ecchymosis. Psych: Awake and oriented X 3, normal affect, Insight and Judgment appropriate.     Izora Ribas, NP 10:58 AM [facility]

## 2017-09-14 ENCOUNTER — Encounter: Payer: Self-pay | Admitting: Adult Health

## 2017-09-14 ENCOUNTER — Ambulatory Visit (INDEPENDENT_AMBULATORY_CARE_PROVIDER_SITE_OTHER): Payer: Medicare Other | Admitting: Adult Health

## 2017-09-14 VITALS — BP 132/80 | Temp 97.5°F | Resp 16 | Ht 65.5 in | Wt 152.6 lb

## 2017-09-14 DIAGNOSIS — J189 Pneumonia, unspecified organism: Secondary | ICD-10-CM

## 2017-09-14 DIAGNOSIS — E876 Hypokalemia: Secondary | ICD-10-CM

## 2017-09-14 DIAGNOSIS — J181 Lobar pneumonia, unspecified organism: Secondary | ICD-10-CM | POA: Diagnosis not present

## 2017-09-14 LAB — CBC WITH DIFFERENTIAL/PLATELET
BASOS ABS: 38 {cells}/uL (ref 0–200)
Basophils Relative: 0.8 %
Eosinophils Absolute: 139 cells/uL (ref 15–500)
Eosinophils Relative: 2.9 %
HCT: 33.8 % — ABNORMAL LOW (ref 35.0–45.0)
HEMOGLOBIN: 11.3 g/dL — AB (ref 11.7–15.5)
LYMPHS ABS: 1008 {cells}/uL (ref 850–3900)
MCH: 26.5 pg — ABNORMAL LOW (ref 27.0–33.0)
MCHC: 33.4 g/dL (ref 32.0–36.0)
MCV: 79.3 fL — ABNORMAL LOW (ref 80.0–100.0)
MONOS PCT: 6.5 %
MPV: 9.7 fL (ref 7.5–12.5)
Neutro Abs: 3302 cells/uL (ref 1500–7800)
Neutrophils Relative %: 68.8 %
Platelets: 473 10*3/uL — ABNORMAL HIGH (ref 140–400)
RBC: 4.26 10*6/uL (ref 3.80–5.10)
RDW: 13.3 % (ref 11.0–15.0)
Total Lymphocyte: 21 %
WBC: 4.8 10*3/uL (ref 3.8–10.8)
WBCMIX: 312 {cells}/uL (ref 200–950)

## 2017-09-14 LAB — BASIC METABOLIC PANEL WITH GFR
BUN: 8 mg/dL (ref 7–25)
CALCIUM: 9.5 mg/dL (ref 8.6–10.4)
CO2: 28 mmol/L (ref 20–32)
CREATININE: 0.69 mg/dL (ref 0.60–0.93)
Chloride: 107 mmol/L (ref 98–110)
GFR, Est African American: 101 mL/min/{1.73_m2} (ref >=60)
GFR, Est Non African American: 87 mL/min/{1.73_m2} (ref >=60)
GLUCOSE: 100 mg/dL — AB (ref 65–99)
POTASSIUM: 5 mmol/L (ref 3.5–5.3)
SODIUM: 146 mmol/L (ref 135–146)

## 2017-09-14 MED ORDER — LEVOFLOXACIN 750 MG PO TABS: 750.0000 mg | ORAL_TABLET | Freq: Every day | ORAL | 0 refills | Status: DC

## 2017-09-14 NOTE — Patient Instructions (Addendum)
Skip last dose azithromycin- take levaquin 1 tab a day instead. Take at least 5 days, up to 10 days. May stop taking once symptoms have resolved for 48-36 hours.   Push fluids- may alternate water and gatorade- we are checking your electrolytes today because your potassium was on the low side in the ER.   Make sure you are eating- may stick to bland foods, soups for a while if you have a reduced appetite.    Community-Acquired Pneumonia, Adult Pneumonia is an infection of the lungs. There are different types of pneumonia. One type can develop while a person is in a hospital. A different type, called community-acquired pneumonia, develops in people who are not, or have not recently been, in the hospital or other health care facility. What are the causes? Pneumonia may be caused by bacteria, viruses, or funguses. Community-acquired pneumonia is often caused by Streptococcus pneumonia bacteria. These bacteria are often passed from one person to another by breathing in droplets from the cough or sneeze of an infected person. What increases the risk? The condition is more likely to develop in:  People who havechronic diseases, such as chronic obstructive pulmonary disease (COPD), asthma, congestive heart failure, cystic fibrosis, diabetes, or kidney disease.  People who haveearly-stage or late-stage HIV.  People who havesickle cell disease.  People who havehad their spleen removed (splenectomy).  People who havepoor Human resources officer.  People who havemedical conditions that increase the risk of breathing in (aspirating) secretions their own mouth and nose.  People who havea weakened immune system (immunocompromised).  People who smoke.  People whotravel to areas where pneumonia-causing germs commonly exist.  People whoare around animal habitats or animals that have pneumonia-causing germs, including birds, bats, rabbits, cats, and farm animals.  What are the signs or  symptoms? Symptoms of this condition include:  Adry cough.  A wet (productive) cough.  Fever.  Sweating.  Chest pain, especially when breathing deeply or coughing.  Rapid breathing or difficulty breathing.  Shortness of breath.  Shaking chills.  Fatigue.  Muscle aches.  How is this diagnosed? Your health care provider will take a medical history and perform a physical exam. You may also have other tests, including:  Imaging studies of your chest, including X-rays.  Tests to check your blood oxygen level and other blood gases.  Other tests on blood, mucus (sputum), fluid around your lungs (pleural fluid), and urine.  If your pneumonia is severe, other tests may be done to identify the specific cause of your illness. How is this treated? The type of treatment that you receive depends on many factors, such as the cause of your pneumonia, the medicines you take, and other medical conditions that you have. For most adults, treatment and recovery from pneumonia may occur at home. In some cases, treatment must happen in a hospital. Treatment may include:  Antibiotic medicines, if the pneumonia was caused by bacteria.  Antiviral medicines, if the pneumonia was caused by a virus.  Medicines that are given by mouth or through an IV tube.  Oxygen.  Respiratory therapy.  Although rare, treating severe pneumonia may include:  Mechanical ventilation. This is done if you are not breathing well on your own and you cannot maintain a safe blood oxygen level.  Thoracentesis. This procedureremoves fluid around one lung or both lungs to help you breathe better.  Follow these instructions at home:  Take over-the-counter and prescription medicines only as told by your health care provider. ? Only takecough medicine if  you are losing sleep. Understand that cough medicine can prevent your body's natural ability to remove mucus from your lungs. ? If you were prescribed an antibiotic  medicine, take it as told by your health care provider. Do not stop taking the antibiotic even if you start to feel better.  Sleep in a semi-upright position at night. Try sleeping in a reclining chair, or place a few pillows under your head.  Do not use tobacco products, including cigarettes, chewing tobacco, and e-cigarettes. If you need help quitting, ask your health care provider.  Drink enough water to keep your urine clear or pale yellow. This will help to thin out mucus secretions in your lungs. How is this prevented? There are ways that you can decrease your risk of developing community-acquired pneumonia. Consider getting a pneumococcal vaccine if:  You are older than 72 years of age.  You are older than 72 years of age and are undergoing cancer treatment, have chronic lung disease, or have other medical conditions that affect your immune system. Ask your health care provider if this applies to you.  There are different types and schedules of pneumococcal vaccines. Ask your health care provider which vaccination option is best for you. You may also prevent community-acquired pneumonia if you take these actions:  Get an influenza vaccine every year. Ask your health care provider which type of influenza vaccine is best for you.  Go to the dentist on a regular basis.  Wash your hands often. Use hand sanitizer if soap and water are not available.  Contact a health care provider if:  You have a fever.  You are losing sleep because you cannot control your cough with cough medicine. Get help right away if:  You have worsening shortness of breath.  You have increased chest pain.  Your sickness becomes worse, especially if you are an older adult or have a weakened immune system.  You cough up blood. This information is not intended to replace advice given to you by your health care provider. Make sure you discuss any questions you have with your health care provider. Document  Released: 11/27/2005 Document Revised: 04/06/2016 Document Reviewed: 03/24/2015 Elsevier Interactive Patient Education  2017 [employer].

## 2017-09-17 ENCOUNTER — Telehealth: Payer: Self-pay | Admitting: Adult Health

## 2017-09-17 ENCOUNTER — Other Ambulatory Visit: Payer: Self-pay | Admitting: Physician Assistant

## 2017-09-17 DIAGNOSIS — J189 Pneumonia, unspecified organism: Secondary | ICD-10-CM

## 2017-09-17 DIAGNOSIS — J181 Lobar pneumonia, unspecified organism: Principal | ICD-10-CM

## 2017-09-17 MED ORDER — DOXYCYCLINE HYCLATE 100 MG PO CAPS: ORAL_CAPSULE | ORAL | 0 refills | Status: DC

## 2017-09-17 NOTE — Telephone Encounter (Signed)
Patient calls to report she is experiencing: itching, HA, chest pain, muscle aches since starting levofloxacin prescribed for pneumonia not fully treated by rocephin/azithromycin with history of levofloxacin being well tolerated. Will discontinue levofloxacin and send in doxycycline.

## 2017-09-18 ENCOUNTER — Telehealth: Payer: Self-pay

## 2017-09-18 NOTE — Telephone Encounter (Signed)
-----   Message from Liane Comber, NP sent at 09/17/2017  5:58 PM EDT ----- Regarding: Discontinue levofloxacin Hi Langley Gauss,  Can you let Ms. Mutz know I got her message, and to stop the levofloxacin and start doxycycline instead- I have already sent this in to her pharmacy.   Thanks

## 2017-09-18 NOTE — Telephone Encounter (Signed)
Pt was informed to stop levofloxacin & start the doxycycline. Pt agreed & voiced understanding.

## 2017-09-19 NOTE — Progress Notes (Signed)
Pt aware of lab results & voiced understanding of those results.

## 2017-09-21 ENCOUNTER — Encounter: Payer: Self-pay | Admitting: Adult Health

## 2017-09-21 ENCOUNTER — Ambulatory Visit (INDEPENDENT_AMBULATORY_CARE_PROVIDER_SITE_OTHER): Payer: Medicare Other | Admitting: Adult Health

## 2017-09-21 VITALS — BP 132/80 | HR 85 | Temp 97.9°F | Ht 65.5 in

## 2017-09-21 DIAGNOSIS — M810 Age-related osteoporosis without current pathological fracture: Secondary | ICD-10-CM

## 2017-09-21 DIAGNOSIS — Z0000 Encounter for general adult medical examination without abnormal findings: Secondary | ICD-10-CM

## 2017-09-21 DIAGNOSIS — R6889 Other general symptoms and signs: Secondary | ICD-10-CM | POA: Diagnosis not present

## 2017-09-21 DIAGNOSIS — Z0001 Encounter for general adult medical examination with abnormal findings: Secondary | ICD-10-CM

## 2017-09-21 DIAGNOSIS — K219 Gastro-esophageal reflux disease without esophagitis: Secondary | ICD-10-CM

## 2017-09-21 DIAGNOSIS — F329 Major depressive disorder, single episode, unspecified: Secondary | ICD-10-CM | POA: Diagnosis not present

## 2017-09-21 DIAGNOSIS — R7303 Prediabetes: Secondary | ICD-10-CM

## 2017-09-21 DIAGNOSIS — E782 Mixed hyperlipidemia: Secondary | ICD-10-CM | POA: Diagnosis not present

## 2017-09-21 DIAGNOSIS — Z79899 Other long term (current) drug therapy: Secondary | ICD-10-CM

## 2017-09-21 DIAGNOSIS — Z23 Encounter for immunization: Secondary | ICD-10-CM | POA: Diagnosis not present

## 2017-09-21 DIAGNOSIS — Z1211 Encounter for screening for malignant neoplasm of colon: Secondary | ICD-10-CM

## 2017-09-21 DIAGNOSIS — I8393 Asymptomatic varicose veins of bilateral lower extremities: Secondary | ICD-10-CM | POA: Diagnosis not present

## 2017-09-21 DIAGNOSIS — E559 Vitamin D deficiency, unspecified: Secondary | ICD-10-CM | POA: Diagnosis not present

## 2017-09-21 DIAGNOSIS — I10 Essential (primary) hypertension: Secondary | ICD-10-CM | POA: Diagnosis not present

## 2017-09-21 DIAGNOSIS — R0609 Other forms of dyspnea: Secondary | ICD-10-CM | POA: Diagnosis not present

## 2017-09-21 NOTE — Patient Instructions (Signed)
Citalopram tablets What is this medicine? CITALOPRAM (sye TAL oh pram) is a medicine for depression. This medicine may be used for other purposes; ask your health care provider or pharmacist if you have questions. COMMON BRAND NAME(S): Celexa What should I tell my health care provider before I take this medicine? They need to know if you have any of these conditions: -bleeding disorders -bipolar disorder or a family history of bipolar disorder -glaucoma -heart disease -history of irregular heartbeat -kidney disease -liver disease -low levels of magnesium or potassium in the blood -receiving electroconvulsive therapy -seizures -suicidal thoughts, plans, or attempt; a previous suicide attempt by you or a family member -take medicines that treat or prevent blood clots -thyroid disease -an unusual or allergic reaction to citalopram, escitalopram, other medicines, foods, dyes, or preservatives -pregnant or trying to become pregnant -breast-feeding How should I use this medicine? Take this medicine by mouth with a glass of water. Follow the directions on the prescription label. You can take it with or without food. Take your medicine at regular intervals. Do not take your medicine more often than directed. Do not stop taking this medicine suddenly except upon the advice of your doctor. Stopping this medicine too quickly may cause serious side effects or your condition may worsen. A special MedGuide will be given to you by the pharmacist with each prescription and refill. Be sure to read this information carefully each time. Talk to your pediatrician regarding the use of this medicine in children. Special care may be needed. Patients over 60 years old may have a stronger reaction and need a smaller dose. Overdosage: If you think you have taken too much of this medicine contact a poison control center or emergency room at once. NOTE: This medicine is only for you. Do not share this medicine with  others. What if I miss a dose? If you miss a dose, take it as soon as you can. If it is almost time for your next dose, take only that dose. Do not take double or extra doses. What may interact with this medicine? Do not take this medicine with any of the following medications: -certain medicines for fungal infections like fluconazole, itraconazole, ketoconazole, posaconazole, voriconazole -cisapride -dofetilide -dronedarone -escitalopram -linezolid -MAOIs like Carbex, Eldepryl, Marplan, Nardil, and Parnate -methylene blue (injected into a vein) -pimozide -thioridazine -ziprasidone This medicine may also interact with the following medications: -alcohol -amphetamines -aspirin and aspirin-like medicines -carbamazepine -certain medicines for depression, anxiety, or psychotic disturbances -certain medicines for infections like chloroquine, clarithromycin, erythromycin, furazolidone, isoniazid, pentamidine -certain medicines for migraine headaches like almotriptan, eletriptan, frovatriptan, naratriptan, rizatriptan, sumatriptan, zolmitriptan -certain medicines for sleep -certain medicines that treat or prevent blood clots like dalteparin, enoxaparin, warfarin -cimetidine -diuretics -fentanyl -lithium -methadone -metoprolol -NSAIDs, medicines for pain and inflammation, like ibuprofen or naproxen -omeprazole -other medicines that prolong the QT interval (cause an abnormal heart rhythm) -procarbazine -rasagiline -supplements like St. John's wort, kava kava, valerian -tramadol -tryptophan This list may not describe all possible interactions. Give your health care provider a list of all the medicines, herbs, non-prescription drugs, or dietary supplements you use. Also tell them if you smoke, drink alcohol, or use illegal drugs. Some items may interact with your medicine. What should I watch for while using this medicine? Tell your doctor if your symptoms do not get better or if they  get worse. Visit your doctor or health care professional for regular checks on your progress. Because it may take several weeks to see the full   effects of this medicine, it is important to continue your treatment as prescribed by your doctor. Patients and their families should watch out for new or worsening thoughts of suicide or depression. Also watch out for sudden changes in feelings such as feeling anxious, agitated, panicky, irritable, hostile, aggressive, impulsive, severely restless, overly excited and hyperactive, or not being able to sleep. If this happens, especially at the beginning of treatment or after a change in dose, call your health care professional. You may get drowsy or dizzy. Do not drive, use machinery, or do anything that needs mental alertness until you know how this medicine affects you. Do not stand or sit up quickly, especially if you are an older patient. This reduces the risk of dizzy or fainting spells. Alcohol may interfere with the effect of this medicine. Avoid alcoholic drinks. Your mouth may get dry. Chewing sugarless gum or sucking hard candy, and drinking plenty of water will help. Contact your doctor if the problem does not go away or is severe. What side effects may I notice from receiving this medicine? Side effects that you should report to your doctor or health care professional as soon as possible: -allergic reactions like skin rash, itching or hives, swelling of the face, lips, or tongue -anxious -black, tarry stools -breathing problems -changes in vision -chest pain -confusion -elevated mood, decreased need for sleep, racing thoughts, impulsive behavior -eye pain -fast, irregular heartbeat -feeling faint or lightheaded, falls -feeling agitated, angry, or irritable -hallucination, loss of contact with reality -loss of balance or coordination -loss of memory -painful or prolonged erections -restlessness, pacing, inability to keep  still -seizures -stiff muscles -suicidal thoughts or other mood changes -trouble sleeping -unusual bleeding or bruising -unusually weak or tired -vomiting Side effects that usually do not require medical attention (report to your doctor or health care professional if they continue or are bothersome): -change in appetite or weight -change in sex drive or performance -dizziness -headache -increased sweating -indigestion, nausea -tremors This list may not describe all possible side effects. Call your doctor for medical advice about side effects. You may report side effects to FDA at 1-800-FDA-1088. Where should I keep my medicine? Keep out of reach of children. Store at room temperature between 15 and 30 degrees C (59 and 86 degrees F). Throw away any unused medicine after the expiration date. NOTE: This sheet is a summary. It may not cover all possible information. If you have questions about this medicine, talk to your doctor, pharmacist, or health care provider.  2018 Elsevier/Gold Standard (2016-05-01 13:18:52)  

## 2017-09-21 NOTE — Progress Notes (Addendum)
MEDICARE ANNUAL WELLNESS VISIT AND FOLLOW UP  Assessment:   [name] was seen today for medicare wellness and follow-up.  Diagnoses and all orders for this visit:  Essential hypertension -     Well controlled on current medications -     Check BP at home, call if consistently above 130/80  Mixed hyperlipidemia -     Well controlled on current medications -     Hepatic function panel -     Lipid panel  Prediabetes -     Currently well controlled by lifestyle changes -     Hemoglobin A1c  Vitamin D deficiency -     VITAMIN D 25 Hydroxy (Vit-D Deficiency, Fractures)  Needs flu shot -     Flu vaccine HIGH DOSE PF  Varicose veins of both lower extremities, unspecified whether complicated      -      Without complication      -      Support hose discussed  Gastroesophageal reflux disease, esophagitis presence not specified -     CBC with Differential/Platelet -     BASIC METABOLIC PANEL WITH GFR -     Well controlled-continue PPI -     Discussed avoiding triggers  Age-related osteoporosis without current pathological fracture -     BASIC METABOLIC PANEL WITH GFR -     VITAMIN D 25 Hydroxy (Vit-D Deficiency, Fractures) -     Continue vitamin D supplementation  Depression      -      PHQ-9; related to husband's passing in past year/familial discord      -      Patient would like to try medication; initiating citalopram 10 mg  Exertional dyspnea      -      Currently recovering from a RLL pneumonia; 2015 stress test results noted- should this symptom continue despite resolution of pneumonia, consider cardiology/spirometry- hx of secondhand smoke exposure.   Colon cancer screening     -      Missing colonoscopy report located and scanned in; referral canceled.   Over 30 minutes of exam, counseling, chart review, and critical decision making was performed  Future Appointments Date Time Provider [place]  01/08/2018 9:00 AM Unk Pinto, MD [facility] None     Plan:   During the course of the visit the patient was educated and counseled about appropriate screening and preventive services including:    Pneumococcal vaccine   Influenza vaccine  Td vaccine  Prevnar 13  Screening electrocardiogram  Screening mammography  Bone densitometry screening  Colorectal cancer screening  Diabetes screening  Glaucoma screening  Nutrition counseling   Advanced directives: given info/requested copies   Subjective:   Emily Jenkins is a 71 y.o. female who presents for Medicare Annual Wellness Visit and 3 month follow up on hypertension, prediabetes, hyperlipidemia, vitamin D def. She is recovering from a RLL pneumonia for which she presented to the ED 09/10/2017, and was followed up by this office 10/5- she states she is feeling much better with fatigue and SOB much improved from previous, though she does still have some of this. Discussed her result of PHQ-9 of 9; the patient shares she lost her husband this past year, and there has been ongoing familiar discord since. She is interested in initiating a low dose medication today- discussed this may be titrated up but will continue for 6-9 months then reevaluate need.   She has lost some weight; likely related to recent  illness:  Wt Readings from Last 3 Encounters:  09/14/17 152 lb 9.6 oz (69.2 kg)  09/10/17 155 lb (70.3 kg)  07/26/17 157 lb (71.2 kg)   Her blood pressure has been controlled at home, today their BP is BP: 132/80 She is currently recovering from an illness and has not been working out. She endorses some shortness of breath with exertion (walking to the mailbox), denies CP, dizziness. She reports this is ongoing; note stress test in 2015 which noted: Intermediate risk stress nuclear study with medium size moderate severity ischemia in the PDA RCA/PDA territory (SDS 6). . LV Ejection Fraction: 62% will reevaluate after conclusion of illness.   She is on cholesterol  medication and denies myalgias. Her cholesterol is at goal. The cholesterol last visit was:   Component Value Date   CHOL 173 06/11/2017   HDL 84 06/11/2017   LDLCALC 81 06/11/2017   TRIG 42 06/11/2017   CHOLHDL 2.1 06/11/2017   She has not been working on diet and exercise for prediabetes, and denies hyperglycemia, hypoglycemia , nausea and paresthesia of the feet. Last A1C in the office was: Lab Results  Component Value Date   HGBA1C 5.8 (H) 06/11/2017   Last GFR Lab Results  Component Value Date   GFRNONAA 87 09/14/2017   Lab Results  Component Value Date   GFRAA 101 09/14/2017   Patient is on Vitamin D supplement. Lab Results  Component Value Date   VD25OH 67 06/11/2017      Medication Review Current Outpatient Prescriptions on File Prior to Visit  Medication Sig Dispense Refill  . aspirin 81 MG tablet Take 81 mg by mouth every evening.     . cholecalciferol (VITAMIN D) 1000 units tablet Take 5,000 Units by mouth daily.     . pantoprazole (PROTONIX) 40 MG tablet Take 1 tablet (40 mg total) by mouth 2 (two) times daily before a meal. 60 tablet 1  . polyethylene glycol (MIRALAX / GLYCOLAX) packet Take 17 g by mouth daily.     . pseudoephedrine-acetaminophen (TYLENOL SINUS) 30-500 MG TABS tablet Take 1 tablet by mouth every 4 (four) hours as needed.    . simvastatin (ZOCOR) 20 MG tablet Take 20 mg by mouth daily at 6 PM.    . telmisartan (MICARDIS) 80 MG tablet Take 1 tablet (80 mg total) by mouth daily. 90 tablet 1  . vitamin C (ASCORBIC ACID) 500 MG tablet Take 500 mg by mouth daily.     No current facility-administered medications on file prior to visit.     Allergies: Allergies  Allergen Reactions  . Fosamax [Alendronate Sodium] Itching  . Levofloxacin     Headache, chest pain, muscle aches  . Sulfa Antibiotics Itching  . Tramadol Itching    Current Problems (verified) has Varicose veins; Essential hypertension; Mixed hyperlipidemia; Prediabetes; Vitamin D  deficiency; Medication management; GERD ; Osteopenia; and BMI 26.0-26.9,adult on her problem list.  Screening Tests Immunization History  Administered Date(s) Administered  . DTaP 12/11/2000  . Influenza Split 10/24/2013  . Influenza, High Dose Seasonal PF 09/17/2014, 09/09/2015, 08/21/2016, 09/21/2017  . Pneumococcal Conjugate-13 08/21/2016  . Pneumococcal Polysaccharide-23 12/11/2002  . Tdap 07/11/2012  . Zoster 08/06/2013   Prior vaccinations: TD or Tdap: 2013  Influenza: 2018 Pneumococcal: 2004 Prevnar 13: 2017 Shingles/Zostavax: 2014  Last colonoscopy: 2011; next due 2021 Last mammogram: 10/26/2016  Last pap smear/pelvic exam: pap 2014 DONE, pelvic 2017 DEXA: 2015, 2017 progressed R hip T -2.80, failed fosamax, due 2019  CT  chest 2014 CT head 2018 CT abd 2017 CXR 07/2014 MRI spine 2010 Stress test 07/2014 Cath 07/2014 normal   Names of Other Physician/Practitioners you currently use: 1. [facility]- here for primary care 2. Dr. Wynetta Emery, eye doctor, last visit Aug 2018 3. Dr. Harrington Challenger, dentist, still going q75m without issues  Patient Care Team: Unk Pinto, MD as PCP - General (Internal Medicine) Celestia Khat, [place] (Optometry) Shela Leff, MD as Consulting Physician (Orthopedic Surgery) Juanita Craver, MD as Consulting Physician (Gastroenterology) Burnell Blanks, MD as Consulting Physician (Cardiology)  Surgical: She  has a past surgical history that includes Cholecystectomy; Tubal ligation; Ankle surgery; and left heart catheterization with coronary angiogram (N/A, 07/29/2014). Family Her family history includes Cancer in her father; Diabetes in her father; Heart disease (age of onset: 73) in her mother; Hyperlipidemia in her sister; Hypertension in her mother and sister. Social history  She reports that she has never smoked. She has never used smokeless tobacco. She reports that she does  not drink alcohol or use drugs.  MEDICARE WELLNESS OBJECTIVES: Physical activity: Current Exercise Habits: The patient does not participate in regular exercise at present, Exercise limited by: None identified Cardiac risk factors: Cardiac Risk Factors include: advanced age (>47men, >31 women);hypertension;sedentary lifestyle;smoking/ tobacco exposure Depression/mood screen:   Depression screen [facility] 2/9 09/21/2017  Decreased Interest 1  Down, Depressed, Hopeless 1  PHQ - 2 Score 2  Altered sleeping 3  Tired, decreased energy 1  Change in appetite 1  Feeling bad or failure about yourself  1  Trouble concentrating 1  Moving slowly or fidgety/restless 0  Suicidal thoughts 0  PHQ-9 Score 9  Difficult doing work/chores Not difficult at all    ADLs:  In your present state of health, do you have any difficulty performing the following activities: 09/21/2017 06/11/2017  Hearing? N N  Vision? N N  Difficulty concentrating or making decisions? N N  Walking or climbing stairs? Y N  Dressing or bathing? N N  Doing errands, shopping? N N  Preparing Food and eating ? N -  Using the Toilet? N -  In the past six months, have you accidently leaked urine? N -  Do you have problems with loss of bowel control? N -  Managing your Medications? N -  Managing your Finances? N -  Housekeeping or managing your Housekeeping? N -  Some recent data might be hidden     Cognitive Testing  Alert? Yes  Normal Appearance?Yes  Oriented to person? Yes  Place? Yes   Time? Yes  Recall of three objects?  Yes  Can perform simple calculations? Yes  Displays appropriate judgment?Yes  Can read the correct time from a watch face?Yes  EOL planning: Does Patient Have a Medical Advance Directive?: No Would patient like information on creating a medical advance directive?: Yes (MAU/Ambulatory/Procedural Areas - Information given)        Cognitive Testing  Alert? Yes  Normal Appearance?Yes  Oriented to person?  Yes  Place? Yes   Time? Yes  Recall of three objects?  Yes  Can perform simple calculations? Yes  Displays appropriate judgment?Yes  Can read the correct time from a watch face?Yes  EOL planning: Does Patient Have a Medical Advance Directive?: No Would patient like information on creating a medical advance directive?: Yes (MAU/Ambulatory/Procedural Areas - Information given)    Objective:   Today's Vitals   09/21/17 1105  BP: 132/80  Pulse: 85  Temp: 97.9 F (36.6 C)  Height: 5' 5.5" (1.664 m)  PainSc: 0-No pain   General appearance: alert, no distress, WD/WN,  female HEENT: normocephalic, sclerae anicteric, TMs pearly, nares patent, no discharge or erythema, pharynx normal Oral cavity: MMM, no lesions Neck: supple, no lymphadenopathy, no thyromegaly, no masses Heart: RRR, normal S1, S2, no murmurs Lungs: CTA bilaterally, no wheezes, rhonchi, or rales Abdomen: +bs, soft, non tender, non distended, no masses, no hepatomegaly, no splenomegaly Musculoskeletal: nontender, no swelling, no obvious deformity Extremities: no edema, no cyanosis, no clubbing Pulses: 2+ symmetric, upper and lower extremities, normal cap refill Neurological: alert, oriented x 3, CN2-12 intact, strength normal upper extremities and lower extremities, sensation normal throughout, DTRs 2+ throughout, no cerebellar signs, gait normal Psychiatric: normal affect, behavior normal, pleasant   Medicare Attestation I have personally reviewed: The patient's medical and social history Their use of alcohol, tobacco or illicit drugs Their current medications and supplements The patient's functional ability including ADLs,fall risks, home safety risks, cognitive, and hearing and visual impairment Diet and physical activities Evidence for depression or mood disorders  The patient's weight, height, BMI, and visual acuity have been recorded in the chart.  I have made referrals, counseling, and provided education to the  patient based on review of the above and I have provided the patient with a written personalized care plan for preventive services.    Izora Ribas, NP   09/22/2017      ROS

## 2017-09-22 DIAGNOSIS — F32A Depression, unspecified: Secondary | ICD-10-CM

## 2017-09-22 DIAGNOSIS — R0609 Other forms of dyspnea: Secondary | ICD-10-CM

## 2017-09-22 DIAGNOSIS — F329 Major depressive disorder, single episode, unspecified: Secondary | ICD-10-CM | POA: Insufficient documentation

## 2017-09-22 HISTORY — DX: Depression, unspecified: F32.A

## 2017-09-22 LAB — BASIC METABOLIC PANEL WITH GFR
BUN: 11 mg/dL (ref 7–25)
CO2: 30 mmol/L (ref 20–32)
CREATININE: 0.72 mg/dL (ref 0.60–0.93)
Calcium: 10.1 mg/dL (ref 8.6–10.4)
Chloride: 104 mmol/L (ref 98–110)
GFR, EST AFRICAN AMERICAN: 97 mL/min/{1.73_m2} (ref >=60)
GFR, EST NON AFRICAN AMERICAN: 84 mL/min/{1.73_m2} (ref >=60)
Glucose, Bld: 104 mg/dL — ABNORMAL HIGH (ref 65–99)
Potassium: 4.5 mmol/L (ref 3.5–5.3)
SODIUM: 144 mmol/L (ref 135–146)

## 2017-09-22 LAB — CBC WITH DIFFERENTIAL/PLATELET
BASOS ABS: 51 {cells}/uL (ref 0–200)
Basophils Relative: 1.6 %
EOS ABS: 51 {cells}/uL (ref 15–500)
Eosinophils Relative: 1.6 %
HCT: 37.1 % (ref 35.0–45.0)
HEMOGLOBIN: 12 g/dL (ref 11.7–15.5)
LYMPHS ABS: 890 {cells}/uL (ref 850–3900)
MCH: 25.7 pg — AB (ref 27.0–33.0)
MCHC: 32.3 g/dL (ref 32.0–36.0)
MCV: 79.4 fL — AB (ref 80.0–100.0)
MPV: 9.9 fL (ref 7.5–12.5)
Monocytes Relative: 6 %
NEUTROS ABS: 2016 {cells}/uL (ref 1500–7800)
NEUTROS PCT: 63 %
Platelets: 406 10*3/uL — ABNORMAL HIGH (ref 140–400)
RBC: 4.67 10*6/uL (ref 3.80–5.10)
RDW: 13.8 % (ref 11.0–15.0)
Total Lymphocyte: 27.8 %
WBC mixed population: 192 cells/uL — ABNORMAL LOW (ref 200–950)
WBC: 3.2 10*3/uL — ABNORMAL LOW (ref 3.8–10.8)

## 2017-09-22 LAB — LIPID PANEL
CHOL/HDL RATIO: 2.9 (calc) (ref <5.0)
Cholesterol: 179 mg/dL (ref <200)
HDL: 61 mg/dL (ref >50)
LDL Cholesterol (Calc): 101 mg/dL (calc) — ABNORMAL HIGH
Non-HDL Cholesterol (Calc): 118 mg/dL (calc) (ref <130)
Triglycerides: 77 mg/dL (ref <150)

## 2017-09-22 LAB — HEPATIC FUNCTION PANEL
AG RATIO: 1.3 (calc) (ref 1.0–2.5)
ALKALINE PHOSPHATASE (APISO): 90 U/L (ref 33–130)
ALT: 9 U/L (ref 6–29)
AST: 16 U/L (ref 10–35)
Albumin: 3.9 g/dL (ref 3.6–5.1)
BILIRUBIN DIRECT: 0.1 mg/dL (ref 0.0–0.2)
BILIRUBIN TOTAL: 0.5 mg/dL (ref 0.2–1.2)
Globulin: 3.1 g/dL (calc) (ref 1.9–3.7)
Indirect Bilirubin: 0.4 mg/dL (calc) (ref 0.2–1.2)
Total Protein: 7 g/dL (ref 6.1–8.1)

## 2017-09-22 LAB — VITAMIN D 25 HYDROXY (VIT D DEFICIENCY, FRACTURES): VIT D 25 HYDROXY: 64 ng/mL (ref 30–100)

## 2017-09-22 LAB — TSH: TSH: 1.66 mIU/L (ref 0.40–4.50)

## 2017-09-22 LAB — HEMOGLOBIN A1C
EAG (MMOL/L): 6.8 (calc)
HEMOGLOBIN A1C: 5.9 %{Hb} — AB (ref <5.7)
MEAN PLASMA GLUCOSE: 123 (calc)

## 2017-09-22 MED ORDER — CITALOPRAM HYDROBROMIDE 10 MG PO TABS
10.0000 mg | ORAL_TABLET | Freq: Every day | ORAL | 2 refills | Status: DC
Start: 2017-09-22 — End: 2018-01-08

## 2017-12-05 ENCOUNTER — Other Ambulatory Visit: Payer: Self-pay

## 2017-12-05 NOTE — Patient Outreach (Signed)
[facility]) Care Management  12/05/2017  Emily Jenkins May 24, 1946 [id number]   Medication Adherence call to [name] patient is showing past due under [place] health Care Ins.on Simvastatin 20 mg spoke with patient she said some times she forgets to take it but needs the medication and if we can contact [facility] and order her medication for her. [facility] said they will have it ready for her to pick up.  [facility] [place] Management Direct Dial [phone]  Fax [phone] [email or web address]

## 2018-01-07 NOTE — Progress Notes (Signed)
[facility] Emily Jenkins, M.D.     Emily Jenkins. Emily Jenkins, P.A.-C Emily Jenkins, [place] [address] [place] [address] Telephone [phone] Telefax [phone] Annual Screening/Preventative Visit & Comprehensive Evaluation &  Examination     This very nice 72 y.o. [name] presents for a Screening/Preventative Visit & comprehensive evaluation and management of multiple medical co-morbidities.  Patient has been followed for HTN, Prediabetes, Hyperlipidemia and Vitamin D Deficiency. Patient also has GERD controlled with her meds.       HTN predates circa 1990's. Patient's BP has been controlled at home and patient denies any cardiac symptoms as chest pain, palpitations, shortness of breath, dizziness or ankle swelling. Today's BP is at goal - 136/84.      Patient's hyperlipidemia is controlled with diet and medications. Patient denies myalgias or other medication SE's. Last lipids were at goal: Lab Results  Component Value Date   CHOL 179 09/21/2017   HDL 61 09/21/2017   LDLCALC 81 06/11/2017   TRIG 77 09/21/2017   CHOLHDL 2.9 09/21/2017      Patient has prediabetes (A1c 5.9%/2013) and patient denies reactive hypoglycemic symptoms, visual blurring, diabetic polys, or paresthesias. Last A1c was not at goal: Lab Results  Component Value Date   HGBA1C 5.9 (H) 09/21/2017      Finally, patient has history of Vitamin D Deficiency ("41"/2014) and last Vitamin D was at goal: Lab Results  Component Value Date   VD25OH 64 09/21/2017   Current Outpatient Medications on File Prior to Visit  Medication Sig  . aspirin 81 MG tablet Take 81 mg by mouth every evening.   . cholecalciferol (VITAMIN D) 1000 units tablet Take 5,000 Units by mouth daily.   . polyethylene glycol (MIRALAX / GLYCOLAX) packet Take 17 g by mouth daily.   . pseudoephedrine-acetaminophen (TYLENOL SINUS) 30-500 MG TABS tablet Take 1 tablet by  mouth every 4 (four) hours as needed.  Marland Kitchen telmisartan (MICARDIS) 80 MG tablet Take 1 tablet (80 mg total) by mouth daily.  . vitamin C (ASCORBIC ACID) 500 MG tablet Take 500 mg by mouth daily.  . pantoprazole (PROTONIX) 40 MG tablet Take 1 tablet (40 mg total) by mouth 2 (two) times daily before a meal. (Patient not taking: Reported on 01/08/2018)   No current facility-administered medications on file prior to visit.    Allergies  Allergen Reactions  . Fosamax [Alendronate Sodium] Itching  . Levofloxacin     Headache, chest pain, muscle aches  . Sulfa Antibiotics Itching  . Tramadol Itching   Past Medical History:  Diagnosis Date  . DJD (degenerative joint disease)   . GERD (gastroesophageal reflux disease)   . Hyperlipidemia   . Hypertension   . Pre-diabetes   . RLS (restless legs syndrome)    Health Maintenance  Topic Date Due  . Hepatitis C Screening  May 08, 1946  . PNA vac Low Risk Adult (2 of 2 - PPSV23) 08/21/2017  . MAMMOGRAM  10/26/2018  . COLONOSCOPY  08/10/2020  . TETANUS/TDAP  07/11/2022  . INFLUENZA VACCINE  Completed  . DEXA SCAN  Completed   Immunization History  Administered Date(s) Administered  . DTaP 12/11/2000  . Influenza Split 10/24/2013  . Influenza, High Dose Seasonal PF 09/17/2014, 09/09/2015, 08/21/2016, 09/21/2017  . Pneumococcal Conjugate-13 08/21/2016  . Pneumococcal Polysaccharide-23 12/11/2002, 01/08/2018  . Tdap 07/11/2012  . Zoster 08/06/2013   Last Colon - 08/10/2010 Dr Collene Mares - recc 10 yr f/u Last MGM -  10/26/2016 Past Surgical History:  Procedure Laterality Date  . ANKLE SURGERY    . CHOLECYSTECTOMY    . LEFT HEART CATHETERIZATION WITH CORONARY ANGIOGRAM N/A 07/29/2014   Procedure: LEFT HEART CATHETERIZATION WITH CORONARY ANGIOGRAM;  Surgeon: Burnell Blanks, MD;  Location[facility] C[facility] LAB;  Service: Cardiovascular;  Laterality: N/A;  . TUBAL LIGATION     Family History  Problem Relation Age of Onset  . Heart disease Mother 50        stent  . Hypertension Mother   . Cancer Father   . Diabetes Father   . Hyperlipidemia Sister   . Hypertension Sister    Social History   Tobacco Use  . Smoking status: Never Smoker  . Smokeless tobacco: Never Used  . Tobacco comment: SPOUSE SMOKES  Substance Use Topics  . Alcohol use: No    Alcohol/week: 0.0 oz  . Drug use: No    ROS Constitutional: Denies fever, chills, weight loss/gain, headaches, insomnia,  night sweats, and change in appetite. Does c/o fatigue. Eyes: Denies redness, blurred vision, diplopia, discharge, itchy, watery eyes.  ENT: Denies discharge, congestion, post nasal drip, epistaxis, sore throat, earache, hearing loss, dental pain, Tinnitus, Vertigo, Sinus pain, snoring.  Cardio: Denies chest pain, palpitations, irregular heartbeat, syncope, dyspnea, diaphoresis, orthopnea, PND, claudication, edema Respiratory: denies cough, dyspnea, DOE, pleurisy, hoarseness, laryngitis, wheezing.  Gastrointestinal: Denies dysphagia, heartburn, reflux, water brash, pain, cramps, nausea, vomiting, bloating, diarrhea, constipation, hematemesis, melena, hematochezia, jaundice, hemorrhoids Genitourinary: Denies dysuria, frequency, urgency, nocturia, hesitancy, discharge, hematuria, flank pain Breast: Breast lumps, nipple discharge, bleeding.  Musculoskeletal: Denies arthralgia, myalgia, stiffness, Jt. Swelling, pain, limp, and strain/sprain. Denies falls. Skin: Denies puritis, rash, hives, warts, acne, eczema, changing in skin lesion Neuro: No weakness, tremor, incoordination, spasms, paresthesia, pain Psychiatric: Denies confusion, memory loss, sensory loss. Denies Depression. Endocrine: Denies change in weight, skin, hair change, nocturia, and paresthesia, diabetic polys, visual blurring, hyper / hypo glycemic episodes.  Heme/Lymph: No excessive bleeding, bruising, enlarged lymph nodes.  Physical Exam  BP 136/84   Pulse 88   Temp 97.7 F (36.5 C)   Resp 16   Ht 5\' 6"   (1.676 m)   Wt 161 lb 3.2 oz (73.1 kg)   BMI 26.02 kg/m   General Appearance: Well nourished, well groomed and in no apparent distress.  Eyes: PERRLA, EOMs, conjunctiva no swelling or erythema, normal fundi and vessels. Sinuses: No frontal/maxillary tenderness ENT/Mouth: EACs patent / TMs  nl. Nares clear without erythema, swelling, mucoid exudates. Oral hygiene is good. No erythema, swelling, or exudate. Tongue normal, non-obstructing. Tonsils not swollen or erythematous. Hearing normal.  Neck: Supple, thyroid normal. No bruits, nodes or JVD. Respiratory: Respiratory effort normal.  BS equal and clear bilateral without rales, rhonci, wheezing or stridor. Cardio: Heart sounds are normal with regular rate and rhythm and no murmurs, rubs or gallops. Peripheral pulses are normal and equal bilaterally without edema. No aortic or femoral bruits. Chest: symmetric with normal excursions and percussion. Breasts: Symmetric, without lumps, nipple discharge, retractions, or fibrocystic changes.  Abdomen: Flat, soft with bowel sounds active. Nontender, no guarding, rebound, hernias, masses, or organomegaly.  Lymphatics: Non tender without lymphadenopathy.  Genitourinary:  Musculoskeletal: Full ROM all peripheral extremities, joint stability, 5/5 strength, and normal gait. Skin: Warm and dry without rashes, lesions, cyanosis, clubbing or  ecchymosis.  Neuro: Cranial nerves intact, reflexes equal bilaterally. Normal muscle tone, no cerebellar symptoms. Sensation intact.  Pysch: Alert and oriented X 3, normal affect, Insight and Judgment appropriate.  Assessment and Plan  1. Annual Preventative Screening Examination       Patient was counseled in prudent diet to achieve/maintain BMI less than 25 for weight control, BP monitoring, regular exercise and medications. Discussed med's effects and SE's. Screening labs and tests as requested with regular follow-up as recommended. Over 40 minutes of exam,  counseling, chart review and high complex critical decision making was performed.

## 2018-01-07 NOTE — Patient Instructions (Signed)

## 2018-01-07 NOTE — Progress Notes (Signed)
[facility] Unk Pinto, M.D.     Uvaldo Bristle. Silverio Lay, P.A.-C Liane Comber, [place] [address] [place], [place] [address] Telephone [phone] Telefax [phone] Annual Screening/Preventative Visit & Comprehensive Evaluation &  Examination     This very nice 72 y.o. [name] presents for a Screening/Preventative Visit & comprehensive evaluation and management of multiple medical co-morbidities.  Patient has been followed for HTN, Prediabetes, Hyperlipidemia and Vitamin D Deficiency. Patient has GERD and has tapered off of her Protonix to infrequent prn usage and control mainly with prudent diet.       HTN predates circa 1990's. Patient's BP has been controlled at home and patient denies any cardiac symptoms as chest pain, palpitations, shortness of breath, dizziness or ankle swelling. Today's BP is at goal - 136/84.      Patient's hyperlipidemia is controlled with diet and medications. Patient denies myalgias or other medication SE's. Last lipids were at goal: Lab Results  Component Value Date   CHOL 179 09/21/2017   HDL 61 09/21/2017   LDLCALC 81 06/11/2017   TRIG 77 09/21/2017   CHOLHDL 2.9 09/21/2017      Patient has prediabetes p (A1c 5.9%/2013)  and patient denies reactive hypoglycemic symptoms, visual blurring, diabetic polys, or paresthesias. Last A1c was still not at goal: Lab Results  Component Value Date   HGBA1C 5.9 (H) 09/21/2017      Finally, patient has history of Vitamin D Deficiency ("41" on treatment in 2014) and last Vitamin D was at goal: Lab Results  Component Value Date   VD25OH 64 09/21/2017   Current Outpatient Medications on File Prior to Visit  Medication Sig  . aspirin 81 MG tablet Take every evening.   Marland Kitchen VITAMIN D 5,000 units  Takedaily.   . polyethylene glycol  Take 17 g by mouth daily.   Rhea Pink SINUS 30-500 MG  Take 1 tab every 4  hours as needed.  Marland Kitchen telmisartan   80 MG tablet Take 1 tab daily.  . vitamin C 500 MG tablet Take  daily.   Allergies  Allergen Reactions  . Fosamax [Alendronate Sodium] Itching  . Levofloxacin     Headache, chest pain, muscle aches  . Sulfa Antibiotics Itching  . Tramadol Itching   Past Medical History:  Diagnosis Date  . DJD (degenerative joint disease)   . GERD (gastroesophageal reflux disease)   . Hyperlipidemia   . Hypertension   . Pre-diabetes   . RLS (restless legs syndrome)    Health Maintenance  Topic Date Due  . Hepatitis C Screening  November 21, 1946  . PNA vac Low Risk Adult (2 of 2 - PPSV23) 08/21/2017  . MAMMOGRAM  10/26/2018  . COLONOSCOPY  08/10/2020  . TETANUS/TDAP  07/11/2022  . INFLUENZA VACCINE  Completed  . DEXA SCAN  Completed   Immunization History  Administered Date(s) Administered  . DTaP 12/11/2000  . Influenza Split 10/24/2013  . Influenza, High Dose Seasonal PF 09/17/2014, 09/09/2015, 08/21/2016, 09/21/2017  . Pneumococcal Conjugate-13 08/21/2016  . Pneumococcal Polysaccharide-23 12/11/2002  . Tdap 07/11/2012  . Zoster 08/06/2013   Last Colon -  Last Pap -  Past Surgical History:  Procedure Laterality Date  . ANKLE SURGERY    . CHOLECYSTECTOMY    . LEFT HEART CATHETERIZATION WITH CORONARY ANGIOGRAM N/A 07/29/2014   Procedure: LEFT HEART CATHETERIZATION WITH CORONARY ANGIOGRAM;  Surgeon: Burnell Blanks, MD;  Location: [facility] LAB;  Service: Cardiovascular;  Laterality: N/A;  .  TUBAL LIGATION     Family History  Problem Relation Age of Onset  . Heart disease Mother 60       stent  . Hypertension Mother   . Cancer Father   . Diabetes Father   . Hyperlipidemia Sister   . Hypertension Sister    Social History   Tobacco Use  . Smoking status: Never Smoker  . Smokeless tobacco: Never Used  . Tobacco comment: SPOUSE SMOKES  Substance Use Topics  . Alcohol use: No    Alcohol/week: 0.0 oz  . Drug use: No    ROS Constitutional: Denies fever, chills, weight  loss/gain, headaches, insomnia,  night sweats, and change in appetite. Does c/o fatigue. Eyes: Denies redness, blurred vision, diplopia, discharge, itchy, watery eyes.  ENT: Denies discharge, congestion, post nasal drip, epistaxis, sore throat, earache, hearing loss, dental pain, Tinnitus, Vertigo, Sinus pain, snoring.  Cardio: Denies chest pain, palpitations, irregular heartbeat, syncope, dyspnea, diaphoresis, orthopnea, PND, claudication, edema Respiratory: denies cough, dyspnea, DOE, pleurisy, hoarseness, laryngitis, wheezing.  Gastrointestinal: Denies dysphagia, heartburn, reflux, water brash, pain, cramps, nausea, vomiting, bloating, diarrhea, constipation, hematemesis, melena, hematochezia, jaundice, hemorrhoids Genitourinary: Denies dysuria, frequency, urgency, nocturia, hesitancy, discharge, hematuria, flank pain Breast: Breast lumps, nipple discharge, bleeding.  Musculoskeletal: Denies arthralgia, myalgia, stiffness, Jt. Swelling, pain, limp, and strain/sprain. Denies falls. Skin: Denies puritis, rash, hives, warts, acne, eczema, changing in skin lesion Neuro: No weakness, tremor, incoordination, spasms, paresthesia, pain Psychiatric: Denies confusion, memory loss, sensory loss. Denies Depression. Endocrine: Denies change in weight, skin, hair change, nocturia, and paresthesia, diabetic polys, visual blurring, hyper / hypo glycemic episodes.  Heme/Lymph: No excessive bleeding, bruising, enlarged lymph nodes.  Physical Exam  BP 136/84   Pulse 88   Temp 97.7 F (36.5 C)   Resp 16   Ht 5\' 6"  (1.676 m)   Wt 161 lb 3.2 oz (73.1 kg)   BMI 26.02 kg/m   General Appearance: Well nourished, well groomed and in no apparent distress.  Eyes: PERRLA, EOMs, conjunctiva no swelling or erythema, normal fundi and vessels. Sinuses: No frontal/maxillary tenderness ENT/Mouth: EACs patent / TMs  nl. Nares clear without erythema, swelling, mucoid exudates. Oral hygiene is good. No erythema,  swelling, or exudate. Tongue normal, non-obstructing. Tonsils not swollen or erythematous. Hearing normal.  Neck: Supple, thyroid normal. No bruits, nodes or JVD. Respiratory: Respiratory effort normal.  BS equal and clear bilateral without rales, rhonci, wheezing or stridor. Cardio: Heart sounds are normal with regular rate and rhythm and no murmurs, rubs or gallops. Peripheral pulses are normal and equal bilaterally without edema. No aortic or femoral bruits. Chest: symmetric with normal excursions and percussion. Breasts: Symmetric, without lumps, nipple discharge, retractions, or fibrocystic changes.  Abdomen: Flat, soft with bowel sounds active. Nontender, no guarding, rebound, hernias, masses, or organomegaly.  Lymphatics: Non tender without lymphadenopathy.  Genitourinary:  Musculoskeletal: Full ROM all peripheral extremities, joint stability, 5/5 strength, and normal gait. Skin: Warm and dry without rashes, lesions, cyanosis, clubbing or  ecchymosis.  Neuro: Cranial nerves intact, reflexes equal bilaterally. Normal muscle tone, no cerebellar symptoms. Sensation intact.  Pysch: Alert and oriented X 3, normal affect, Insight and Judgment appropriate.   Assessment and Plan  1. Annual Preventative Screening Examination  2. Essential hypertension  - EKG 12-Lead - Urinalysis, Routine w reflex microscopic - Microalbumin / creatinine urine ratio - CBC with Differential/Platelet - BASIC METABOLIC PANEL WITH GFR - Magnesium - TSH  3. Hyperlipidemia, mixed  - EKG 12-Lead - Hepatic function panel -  Lipid panel  4. Prediabetes  - EKG 12-Lead - Hemoglobin A1c - Insulin, random  5. Vitamin D deficiency  - VITAMIN D 25 Hydroxy   6. Abnormal glucose  - Hemoglobin A1c - Insulin, random  7. Gastroesophageal reflux disease  - CBC with Differential/Platelet  8. Screening for colorectal cancer  - POC Hemoccult Bld/Stl   9. Screening for ischemic heart disease  - EKG  12-Lead  10. Medication management  - POC Hemoccult Bld/Stl  - CBC with Differential/Platelet - BASIC METABOLIC PANEL WITH GFR - Hepatic function panel - Magnesium - Lipid panel - TSH - Hemoglobin A1c - Insulin, random - VITAMIN D 25 Hydroxy   11. Need for prophylactic vaccination against Streptococcus pneumoniae (pneumococcus)  - Pneumococcal polysaccharide vaccine 23-valent greater than or equal to 2yo subcutaneous/IM       Patient was counseled in prudent diet to achieve/maintain BMI less than 25 for weight control, BP monitoring, regular exercise and medications. Discussed med's effects and SE's. Screening labs and tests as requested with regular follow-up as recommended. Over 40 minutes of exam, counseling, chart review and high complex critical decision making was performed.

## 2018-01-08 ENCOUNTER — Encounter: Payer: Self-pay | Admitting: Internal Medicine

## 2018-01-08 ENCOUNTER — Other Ambulatory Visit: Payer: Self-pay | Admitting: *Deleted

## 2018-01-08 ENCOUNTER — Ambulatory Visit: Payer: Medicare Other | Admitting: Internal Medicine

## 2018-01-08 VITALS — BP 136/84 | HR 88 | Temp 97.7°F | Resp 16 | Ht 66.0 in | Wt 161.2 lb

## 2018-01-08 DIAGNOSIS — E782 Mixed hyperlipidemia: Secondary | ICD-10-CM | POA: Diagnosis not present

## 2018-01-08 DIAGNOSIS — R7309 Other abnormal glucose: Secondary | ICD-10-CM

## 2018-01-08 DIAGNOSIS — I10 Essential (primary) hypertension: Secondary | ICD-10-CM

## 2018-01-08 DIAGNOSIS — E559 Vitamin D deficiency, unspecified: Secondary | ICD-10-CM | POA: Diagnosis not present

## 2018-01-08 DIAGNOSIS — Z23 Encounter for immunization: Secondary | ICD-10-CM

## 2018-01-08 DIAGNOSIS — Z0000 Encounter for general adult medical examination without abnormal findings: Secondary | ICD-10-CM | POA: Diagnosis not present

## 2018-01-08 DIAGNOSIS — K219 Gastro-esophageal reflux disease without esophagitis: Secondary | ICD-10-CM | POA: Diagnosis not present

## 2018-01-08 DIAGNOSIS — Z136 Encounter for screening for cardiovascular disorders: Secondary | ICD-10-CM

## 2018-01-08 DIAGNOSIS — Z79899 Other long term (current) drug therapy: Secondary | ICD-10-CM

## 2018-01-08 DIAGNOSIS — Z1212 Encounter for screening for malignant neoplasm of rectum: Secondary | ICD-10-CM

## 2018-01-08 DIAGNOSIS — R7303 Prediabetes: Secondary | ICD-10-CM | POA: Diagnosis not present

## 2018-01-08 DIAGNOSIS — Z0001 Encounter for general adult medical examination with abnormal findings: Secondary | ICD-10-CM

## 2018-01-08 DIAGNOSIS — Z1211 Encounter for screening for malignant neoplasm of colon: Secondary | ICD-10-CM

## 2018-01-08 MED ORDER — SIMVASTATIN 20 MG PO TABS: 20.0000 mg | ORAL_TABLET | Freq: Every day | ORAL | 1 refills | Status: DC

## 2018-01-09 ENCOUNTER — Other Ambulatory Visit: Payer: Self-pay | Admitting: Internal Medicine

## 2018-01-09 DIAGNOSIS — E782 Mixed hyperlipidemia: Secondary | ICD-10-CM

## 2018-01-09 LAB — LIPID PANEL
CHOL/HDL RATIO: 3.2 (calc) (ref <5.0)
Cholesterol: 230 mg/dL — ABNORMAL HIGH (ref <200)
HDL: 71 mg/dL (ref >50)
LDL Cholesterol (Calc): 142 mg/dL (calc) — ABNORMAL HIGH
NON-HDL CHOLESTEROL (CALC): 159 mg/dL — AB (ref <130)
Triglycerides: 73 mg/dL (ref <150)

## 2018-01-09 LAB — CBC WITH DIFFERENTIAL/PLATELET
BASOS ABS: 30 {cells}/uL (ref 0–200)
Basophils Relative: 1 %
EOS ABS: 162 {cells}/uL (ref 15–500)
EOS PCT: 5.4 %
HEMATOCRIT: 38.2 % (ref 35.0–45.0)
HEMOGLOBIN: 12.7 g/dL (ref 11.7–15.5)
LYMPHS ABS: 825 {cells}/uL — AB (ref 850–3900)
MCH: 26.7 pg — AB (ref 27.0–33.0)
MCHC: 33.2 g/dL (ref 32.0–36.0)
MCV: 80.4 fL (ref 80.0–100.0)
MPV: 11.1 fL (ref 7.5–12.5)
Monocytes Relative: 6.4 %
Neutro Abs: 1791 cells/uL (ref 1500–7800)
Neutrophils Relative %: 59.7 %
Platelets: 234 10*3/uL (ref 140–400)
RBC: 4.75 10*6/uL (ref 3.80–5.10)
RDW: 13 % (ref 11.0–15.0)
Total Lymphocyte: 27.5 %
WBC mixed population: 192 cells/uL — ABNORMAL LOW (ref 200–950)
WBC: 3 10*3/uL — ABNORMAL LOW (ref 3.8–10.8)

## 2018-01-09 LAB — MICROALBUMIN / CREATININE URINE RATIO
Creatinine, Urine: 80 mg/dL (ref 20–275)
MICROALB UR: 1.1 mg/dL
MICROALB/CREAT RATIO: 14 ug/mg{creat} (ref <30)

## 2018-01-09 LAB — HEPATIC FUNCTION PANEL
AG RATIO: 1.6 (calc) (ref 1.0–2.5)
ALKALINE PHOSPHATASE (APISO): 83 U/L (ref 33–130)
ALT: 6 U/L (ref 6–29)
AST: 11 U/L (ref 10–35)
Albumin: 4.2 g/dL (ref 3.6–5.1)
BILIRUBIN DIRECT: 0.1 mg/dL (ref 0.0–0.2)
BILIRUBIN INDIRECT: 0.4 mg/dL (ref 0.2–1.2)
BILIRUBIN TOTAL: 0.5 mg/dL (ref 0.2–1.2)
Globulin: 2.6 g/dL (calc) (ref 1.9–3.7)
Total Protein: 6.8 g/dL (ref 6.1–8.1)

## 2018-01-09 LAB — BASIC METABOLIC PANEL WITH GFR
BUN: 17 mg/dL (ref 7–25)
CO2: 29 mmol/L (ref 20–32)
CREATININE: 0.76 mg/dL (ref 0.60–0.93)
Calcium: 9.9 mg/dL (ref 8.6–10.4)
Chloride: 107 mmol/L (ref 98–110)
GFR, EST AFRICAN AMERICAN: 91 mL/min/{1.73_m2} (ref >=60)
GFR, EST NON AFRICAN AMERICAN: 78 mL/min/{1.73_m2} (ref >=60)
Glucose, Bld: 111 mg/dL — ABNORMAL HIGH (ref 65–99)
POTASSIUM: 4.1 mmol/L (ref 3.5–5.3)
SODIUM: 143 mmol/L (ref 135–146)

## 2018-01-09 LAB — MAGNESIUM: Magnesium: 2 mg/dL (ref 1.5–2.5)

## 2018-01-09 LAB — URINALYSIS, ROUTINE W REFLEX MICROSCOPIC
Bilirubin Urine: NEGATIVE
Glucose, UA: NEGATIVE
HGB URINE DIPSTICK: NEGATIVE
KETONES UR: NEGATIVE
LEUKOCYTES UA: NEGATIVE
NITRITE: NEGATIVE
PROTEIN: NEGATIVE
Specific Gravity, Urine: 1.016 (ref 1.001–1.03)
pH: 6 (ref 5.0–8.0)

## 2018-01-09 LAB — TSH: TSH: 2.59 m[IU]/L (ref 0.40–4.50)

## 2018-01-09 LAB — HEMOGLOBIN A1C
HEMOGLOBIN A1C: 5.7 %{Hb} — AB (ref <5.7)
Mean Plasma Glucose: 117 (calc)
eAG (mmol/L): 6.5 (calc)

## 2018-01-09 LAB — INSULIN, RANDOM: Insulin: 6.7 u[IU]/mL (ref 2.0–19.6)

## 2018-01-09 LAB — VITAMIN D 25 HYDROXY (VIT D DEFICIENCY, FRACTURES): Vit D, 25-Hydroxy: 44 ng/mL (ref 30–100)

## 2018-01-09 MED ORDER — ROSUVASTATIN CALCIUM 20 MG PO TABS: ORAL_TABLET | ORAL | 11 refills | Status: DC

## 2018-01-28 ENCOUNTER — Encounter: Payer: Self-pay | Admitting: Internal Medicine

## 2018-02-06 ENCOUNTER — Ambulatory Visit (INDEPENDENT_AMBULATORY_CARE_PROVIDER_SITE_OTHER): Payer: Medicare Other

## 2018-02-06 ENCOUNTER — Ambulatory Visit (INDEPENDENT_AMBULATORY_CARE_PROVIDER_SITE_OTHER): Payer: Medicare Other | Admitting: Orthopaedic Surgery

## 2018-02-06 ENCOUNTER — Encounter (INDEPENDENT_AMBULATORY_CARE_PROVIDER_SITE_OTHER): Payer: Self-pay | Admitting: Orthopaedic Surgery

## 2018-02-06 VITALS — BP 148/80 | HR 90 | Ht 66.0 in | Wt 161.0 lb

## 2018-02-06 DIAGNOSIS — M25512 Pain in left shoulder: Secondary | ICD-10-CM | POA: Diagnosis not present

## 2018-02-06 MED ORDER — LIDOCAINE HCL 1 % IJ SOLN
0.5000 mL | INTRAMUSCULAR | Status: AC | PRN
  Administered 2018-02-06: .5 mL

## 2018-02-06 MED ORDER — BUPIVACAINE HCL 0.25 % IJ SOLN
4.0000 mL | INTRAMUSCULAR | Status: AC | PRN
  Administered 2018-02-06: 4 mL via INTRA_ARTICULAR

## 2018-02-06 MED ORDER — METHYLPREDNISOLONE ACETATE 40 MG/ML IJ SUSP
40.0000 mg | INTRAMUSCULAR | Status: AC | PRN
  Administered 2018-02-06: 40 mg via INTRA_ARTICULAR

## 2018-02-06 NOTE — Progress Notes (Signed)
Office Visit Note   Patient: Emily Jenkins           Date of Birth: 02/05/1946           MRN: [id number] Visit Date: 02/06/2018              Requested by: Unk Pinto, [address] [place], [place] [address] PCP: Unk Pinto, MD   Assessment & Plan: Visit Diagnoses:  1. Acute pain of left shoulder           With rotator cuff calcific tendinopathy and likely supraspinatus tear.  Plan: Left subacromial injection performed.  We will set up for some physical therapy recheck her in 3-4 weeks.  I discussed with her that she likely has a rotator cuff tear since there is a gap in the calcific tendinopathy between the insertion site and the distal supraspinatus tendon.  Recheck 3 weeks.  Follow-Up Instructions: No Follow-up on file.   Orders:  Orders Placed This Encounter  Procedures  . XR Shoulder Left   No orders of the defined types were placed in this encounter.     Procedures: Large Joint Inj: L subacromial bursa on 02/06/2018 9:00 AM Indications: pain Details: 22 G 1.5 in needle  Arthrogram: No  Medications: 4 mL bupivacaine 0.25 %; 40 mg methylPREDNISolone acetate 40 MG/ML; 0.5 mL lidocaine 1 % Outcome: tolerated well, no immediate complications Procedure, treatment alternatives, risks and benefits explained, specific risks discussed. Consent was given by the patient. Immediately prior to procedure a time out was called to verify the correct patient, procedure, equipment, support staff and site/side marked as required. Patient was prepped and draped in the usual sterile fashion.       Clinical Data: No additional findings.   Subjective: Chief Complaint  Patient presents with  . Left Shoulder - Pain    HPI 72 year old female with several week history of progressive left shoulder pain.  She is right-hand dominant.  She is had problems dressing washing her hair and is not able lift her arm.  She has pain over the deltoid radiates  down to the deltoid insertion.  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  She has prediabetes with an A1c of 5.7.  No past history of shoulder injury.  She has some discomfort in her neck.  No numbness or tingling in her hand.  No gait disturbance.  No problems with her opposite right shoulder.  Negative history of rheumatologic conditions.  Review of Systems positive for hypertension, prediabetes, vitamin D deficiency, GERD, exertional dyspnea, depression otherwise 14 point review of systems negative as it pertains HPI.   Objective: Vital Signs: BP (!) 148/80   Pulse 90   Ht 5\' 6"  (1.676 m)   Wt 161 lb (73 kg)   BMI 25.99 kg/m   Physical Exam  Constitutional: She is oriented to person, place, and time. She appears well-developed.  HENT:  Head: Normocephalic.  Right Ear: External ear normal.  Left Ear: External ear normal.  Eyes: Pupils are equal, round, and reactive to light.  Neck: No tracheal deviation present. No thyromegaly present.  Cardiovascular: Normal rate.  Pulmonary/Chest: Effort normal.  Abdominal: Soft.  Neurological: She is alert and oriented to person, place, and time.  Skin: Skin is warm and dry.  Psychiatric: She has a normal mood and affect. Her behavior is normal.    Ortho Exam patient has active abduction on the left of only 40 degrees.  Positive impingement.  Long head of the biceps  mildly tender without distal migration.  Opposite right shoulder has full abduction and flexion.  No atrophy to the fingers no adenopathy of the wrist MCP or PIP joints.  Normal supination pronation.  Positive drop arm test on the left.  Specialty Comments:  No specialty comments available.  Imaging: No results found.   PMFS History: Patient Active Problem List   Diagnosis Date Noted  . Exertional dyspnea 09/22/2017  . Depression 09/22/2017  . Osteoporosis 06/07/2015  . GERD  03/04/2015  . Medication management 01/05/2014  . Essential hypertension 12/02/2013  . Mixed hyperlipidemia  12/02/2013  . Prediabetes 12/02/2013  . Vitamin D deficiency 12/02/2013  . Varicose veins of both lower extremities 09/16/2012   Past Medical History:  Diagnosis Date  . DJD (degenerative joint disease)   . GERD (gastroesophageal reflux disease)   . Hyperlipidemia   . Hypertension   . Pre-diabetes   . RLS (restless legs syndrome)     Family History  Problem Relation Age of Onset  . Heart disease Mother 10       stent  . Hypertension Mother   . Cancer Father   . Diabetes Father   . Hyperlipidemia Sister   . Hypertension Sister     Past Surgical History:  Procedure Laterality Date  . ANKLE SURGERY    . CHOLECYSTECTOMY    . LEFT HEART CATHETERIZATION WITH CORONARY ANGIOGRAM N/A 07/29/2014   Procedure: LEFT HEART CATHETERIZATION WITH CORONARY ANGIOGRAM;  Surgeon: Burnell Blanks, MD;  Location: [facility] CATH LAB;  Service: Cardiovascular;  Laterality: N/A;  . TUBAL LIGATION     Social History   Occupational History  . Not on file  Tobacco Use  . Smoking status: Never Smoker  . Smokeless tobacco: Never Used  . Tobacco comment: SPOUSE SMOKES  Substance and Sexual Activity  . Alcohol use: No    Alcohol/week: 0.0 oz  . Drug use: No  . Sexual activity: Yes

## 2018-02-27 ENCOUNTER — Ambulatory Visit (INDEPENDENT_AMBULATORY_CARE_PROVIDER_SITE_OTHER): Payer: Medicare Other | Admitting: Orthopaedic Surgery

## 2018-03-04 ENCOUNTER — Ambulatory Visit (INDEPENDENT_AMBULATORY_CARE_PROVIDER_SITE_OTHER): Payer: Medicare Other | Admitting: Orthopaedic Surgery

## 2018-03-12 ENCOUNTER — Ambulatory Visit (INDEPENDENT_AMBULATORY_CARE_PROVIDER_SITE_OTHER): Payer: Medicare Other | Admitting: Orthopaedic Surgery

## 2018-03-20 ENCOUNTER — Encounter (INDEPENDENT_AMBULATORY_CARE_PROVIDER_SITE_OTHER): Payer: Self-pay | Admitting: Surgery

## 2018-03-20 ENCOUNTER — Ambulatory Visit (INDEPENDENT_AMBULATORY_CARE_PROVIDER_SITE_OTHER): Payer: Medicare Other | Admitting: Surgery

## 2018-03-20 DIAGNOSIS — M25512 Pain in left shoulder: Secondary | ICD-10-CM

## 2018-03-20 NOTE — Progress Notes (Signed)
Office Visit Note   Patient: Emily Jenkins           Date of Birth: Feb 10, 1946           MRN: [id number] Visit Date: 03/20/2018              Requested by: Unk Pinto, [address] [place], [place] [address] PCP: Unk Pinto, MD   Assessment & Plan: Visit Diagnoses:  1. Acute pain of left shoulder     Plan: Patient was given home exercises for her shoulder for range of motion and strengthening.  Also given resistance band.  Before leaving she mentions some soreness that she has been having in her back.  Sounds like it is likely related to lumbar degenerative disc disease.  She was given back exercises also.  Follow-up with Dr. Lorin Mercy in 6 weeks for recheck.  Before her next appointment if shoulder continues to be a problem she will call let me know and I will get a MRI.  Follow-Up Instructions: Return in about 6 weeks (around 05/01/2018) for With Dr. Lorin Mercy.   Orders:  No orders of the defined types were placed in this encounter.  No orders of the defined types were placed in this encounter.     Procedures: No procedures performed   Clinical Data: No additional findings.   Subjective: Chief Complaint  Patient presents with  . Left Shoulder - Pain    HPI Patient returns for recheck of left shoulder pain.  Last office visit with Dr. Lorin Mercy she had a subacromial Marcaine/Depo-Medrol injection.  States that this did give some improvement.  Continues to have some soreness with overhead activity and reaching on her back but this is not as bad.  She was unable to go to formal physical therapy due to cost issues. Review of Systems   Objective: Vital Signs: There were no vitals taken for this visit.  Physical Exam  Constitutional: She is oriented to person, place, and time. No distress.  HENT:  Head: Normocephalic.  Eyes: Pupils are equal, round, and reactive to light.  Pulmonary/Chest: No respiratory distress.  Musculoskeletal:  Left  shoulder good range of motion.  Does have mildly positive impingement test.  Some soreness with supraspinatus resistance.  Negative drop arm test.  Neurological: She is alert and oriented to person, place, and time.  Skin: Skin is warm and dry.    Ortho Exam  Specialty Comments:  No specialty comments available.  Imaging: No results found.   PMFS History: Patient Active Problem List   Diagnosis Date Noted  . Exertional dyspnea 09/22/2017  . Depression 09/22/2017  . Osteoporosis 06/07/2015  . GERD  03/04/2015  . Medication management 01/05/2014  . Essential hypertension 12/02/2013  . Mixed hyperlipidemia 12/02/2013  . Prediabetes 12/02/2013  . Vitamin D deficiency 12/02/2013  . Varicose veins of both lower extremities 09/16/2012   Past Medical History:  Diagnosis Date  . DJD (degenerative joint disease)   . GERD (gastroesophageal reflux disease)   . Hyperlipidemia   . Hypertension   . Pre-diabetes   . RLS (restless legs syndrome)     Family History  Problem Relation Age of Onset  . Heart disease Mother 60       stent  . Hypertension Mother   . Cancer Father   . Diabetes Father   . Hyperlipidemia Sister   . Hypertension Sister     Past Surgical History:  Procedure Laterality Date  . ANKLE SURGERY    .  CHOLECYSTECTOMY    . LEFT HEART CATHETERIZATION WITH CORONARY ANGIOGRAM N/A 07/29/2014   Procedure: LEFT HEART CATHETERIZATION WITH CORONARY ANGIOGRAM;  Surgeon: Burnell Blanks, MD;  Location: [facility] CATH LAB;  Service: Cardiovascular;  Laterality: N/A;  . TUBAL LIGATION     Social History   Occupational History  . Not on file  Tobacco Use  . Smoking status: Never Smoker  . Smokeless tobacco: Never Used  . Tobacco comment: SPOUSE SMOKES  Substance and Sexual Activity  . Alcohol use: No    Alcohol/week: 0.0 oz  . Drug use: No  . Sexual activity: Yes

## 2018-04-16 NOTE — Progress Notes (Deleted)
MEDICARE ANNUAL WELLNESS VISIT AND FOLLOW UP  Assessment:   [name] was seen today for medicare wellness and follow-up.  Diagnoses and all orders for this visit:  Essential hypertension -     Well controlled on current medications -     Check BP at home, call if consistently above 130/80  Mixed hyperlipidemia -     Well controlled on current medications -     Hepatic function panel -     Lipid panel  Prediabetes -     Currently well controlled by lifestyle changes -     Hemoglobin A1c  Vitamin D deficiency -     VITAMIN D 25 Hydroxy (Vit-D Deficiency, Fractures)  Needs flu shot -     Flu vaccine HIGH DOSE PF  Varicose veins of both lower extremities, unspecified whether complicated      -      Without complication      -      Support hose discussed  Gastroesophageal reflux disease, esophagitis presence not specified -     CBC with Differential/Platelet -     BASIC METABOLIC PANEL WITH GFR -     Well controlled-continue PPI -     Discussed avoiding triggers  Age-related osteoporosis without current pathological fracture -     BASIC METABOLIC PANEL WITH GFR -     VITAMIN D 25 Hydroxy (Vit-D Deficiency, Fractures) -     Continue vitamin D supplementation  Depression      -      PHQ-9; related to husband's passing in past year/familial discord      -      Patient would like to try medication; initiating citalopram 10 mg  Exertional dyspnea      -      Currently recovering from a RLL pneumonia; 2015 stress test results noted- should this symptom continue despite resolution of pneumonia, consider cardiology/spirometry- hx of secondhand smoke exposure.   Colon cancer screening     -      Missing colonoscopy report located and scanned in; referral canceled.   Over 30 minutes of exam, counseling, chart review, and critical decision making was performed  Future Appointments Date Time Provider [place]  01/08/2018 9:00 AM Unk Pinto, MD [facility] None     Plan:   During the course of the visit the patient was educated and counseled about appropriate screening and preventive services including:    Pneumococcal vaccine   Influenza vaccine  Td vaccine  Prevnar 13  Screening electrocardiogram  Screening mammography  Bone densitometry screening  Colorectal cancer screening  Diabetes screening  Glaucoma screening  Nutrition counseling   Advanced directives: given info/requested copies   Subjective:   Emily Jenkins is a 72 y.o. WAAF who presents for 3 month follow up on hypertension, prediabetes, hyperlipidemia, vitamin D def.   Patient has depression, lost her husband 2018.    She has lost some weight; likely related to recent illness:  Wt Readings from Last 3 Encounters:  02/06/18 161 lb (73 kg)  01/08/18 161 lb 3.2 oz (73.1 kg)  09/14/17 152 lb 9.6 oz (69.2 kg)    Her blood pressure {HAS HAS NOT:18834} been controlled at home, today their BP is    She {DOES_DOES FBP:10258} workout. She denies chest pain, shortness of breath, dizziness. Stress test in 2015 which noted: Intermediate risk stress nuclear study with medium size moderate severity ischemia in the PDA RCA/PDA territory (SDS 6). . LV Ejection Fraction: 62%.  She is on cholesterol medication, crestor and denies myalgias. Her cholesterol {ACTION; IS/IS NOT:21021397} at goal. The cholesterol last visit was:   Lab Results  Component Value Date   CHOL 230 (H) 01/08/2018   HDL 71 01/08/2018   LDLCALC 142 (H) 01/08/2018   TRIG 73 01/08/2018   CHOLHDL 3.2 01/08/2018    She {Has/has not:18111} been working on diet and exercise for prediabetes, and denies {Symptoms; diabetes w/o none:19199}. Last A1C in the office was:  Lab Results  Component Value Date   HGBA1C 5.7 (H) 01/08/2018   Patient is on Vitamin D supplement.   Lab Results  Component Value Date   VD25OH 44 01/08/2018      Medication Review Current Outpatient Medications on File  Prior to Visit  Medication Sig  . aspirin 81 MG tablet Take 81 mg by mouth every evening.   . cholecalciferol (VITAMIN D) 1000 units tablet Take 5,000 Units by mouth daily.   . pantoprazole (PROTONIX) 40 MG tablet Take 1 tablet (40 mg total) by mouth 2 (two) times daily before a meal.  . polyethylene glycol (MIRALAX / GLYCOLAX) packet Take 17 g by mouth daily.   . pseudoephedrine-acetaminophen (TYLENOL SINUS) 30-500 MG TABS tablet Take 1 tablet by mouth every 4 (four) hours as needed.  . rosuvastatin (CRESTOR) 20 MG tablet Take 1/2 to 1 tablet daily or as directed for Cholesterol  . telmisartan (MICARDIS) 80 MG tablet Take 1 tablet (80 mg total) by mouth daily.  . vitamin C (ASCORBIC ACID) 500 MG tablet Take 500 mg by mouth daily.   No current facility-administered medications on file prior to visit.     Allergies Allergies  Allergen Reactions  . Fosamax [Alendronate Sodium] Itching  . Levofloxacin     Headache, chest pain, muscle aches  . Sulfa Antibiotics Itching  . Tramadol Itching    SURGICAL HISTORY She  has a past surgical history that includes Cholecystectomy; Tubal ligation; Ankle surgery; and left heart catheterization with coronary angiogram (N/A, 07/29/2014). FAMILY HISTORY Her family history includes Cancer in her father; Diabetes in her father; Heart disease (age of onset: 70) in her mother; Hyperlipidemia in her sister; Hypertension in her mother and sister. SOCIAL HISTORY She  reports that she has never smoked. She has never used smokeless tobacco. She reports that she does not drink alcohol or use drugs.   Objective:   There were no vitals taken for this visit.   General appearance: alert, no distress, WD/WN,  female HEENT: normocephalic, sclerae anicteric, TMs pearly, nares patent, no discharge or erythema, pharynx normal Oral cavity: MMM, no lesions Neck: supple, no lymphadenopathy, no thyromegaly, no masses Heart: RRR, normal S1, S2, no murmurs Lungs: CTA  bilaterally, no wheezes, rhonchi, or rales Abdomen: +bs, soft, non tender, non distended, no masses, no hepatomegaly, no splenomegaly Musculoskeletal: nontender, no swelling, no obvious deformity Extremities: no edema, no cyanosis, no clubbing Pulses: 2+ symmetric, upper and lower extremities, normal cap refill Neurological: alert, oriented x 3, CN2-12 intact, strength normal upper extremities and lower extremities, sensation normal throughout, DTRs 2+ throughout, no cerebellar signs, gait normal Psychiatric: normal affect, behavior normal, pleasant   Vicie Mutters, PA-C   04/16/2018

## 2018-04-19 ENCOUNTER — Ambulatory Visit: Payer: Self-pay | Admitting: Physician Assistant

## 2018-04-19 ENCOUNTER — Ambulatory Visit: Payer: Self-pay | Admitting: Adult Health

## 2018-04-30 NOTE — Progress Notes (Signed)
FOLLOW UP  Assessment and Plan:   Hypertension Well controlled with current medications  Monitor blood pressure at home; patient to call if consistently greater than 130/80 Continue DASH diet.   Reminder to go to the ER if any CP, SOB, nausea, dizziness, severe HA, changes vision/speech, left arm numbness and tingling and jaw pain.  Cholesterol Currently above goal; was switched to crestor after last visit  But never started - discussed at length and patient will switch medications. Titrate as needed for LDL goal <100 Continue low cholesterol diet and exercise.  Check lipid panel.   Prediabetes Continue diet and exercise.  Perform daily foot/skin check, notify office of any concerning changes.  Check A1C  BMI 26 Continue to recommend diet heavy in fruits and veggies and low in animal meats, cheeses, and dairy products, appropriate calorie intake Discuss exercise recommendations routinely Continue to monitor weight at each visit  Vitamin D Def Below goal at last visit; recommend supplementation for goal of 70-100 Defer Vit D level to next visit  Insomnia- good sleep hygiene discussed, increase day time activity Start gabapentin 300 mg 1-2 tab at night; risks and SE discussed, information provided on AVS  Continue diet and meds as discussed. Further disposition pending results of labs. Discussed med's effects and SE's.   Over 30 minutes of exam, counseling, chart review, and critical decision making was performed.   Future Appointments  Date Time Provider [place]  05/07/2018 10:30 AM Marybelle Killings, MD [facility] None  07/29/2018 10:30 AM Unk Pinto, MD [facility] None  01/23/2019  9:00 AM Unk Pinto, MD [facility] None    ----------------------------------------------------------------------------------------------------------------------  HPI 72 y.o. female  presents for 3 month follow up on hypertension, cholesterol, prediabetes, weight and vitamin D  deficiency. She is having some trouble staying asleep during the night. Has tried melatonin and benadryl intermittently without much success.   she has a diagnosis of GERD which is currently controlled off of medications she reports symptoms is currently well controlled, and denies breakthrough reflux, burning in chest, hoarseness or cough.    BMI is Body mass index is 25.82 kg/m., she has been working on diet and exercise. Wt Readings from Last 3 Encounters:  05/01/18 160 lb (72.6 kg)  02/06/18 161 lb (73 kg)  01/08/18 161 lb 3.2 oz (73.1 kg)   Her blood pressure has been controlled at home, today their BP is BP: 138/70  She does workout. She denies chest pain, shortness of breath, dizziness.   She is on cholesterol medication (simvastatin 20 mg daily) and denies myalgias. Her cholesterol is not at goal. The cholesterol last visit was:   Lab Results  Component Value Date   CHOL 230 (H) 01/08/2018   HDL 71 01/08/2018   LDLCALC 142 (H) 01/08/2018   TRIG 73 01/08/2018   CHOLHDL 3.2 01/08/2018    She has been working on diet and exercise for prediabetes, and denies foot ulcerations, increased appetite, nausea, paresthesia of the feet, polydipsia, polyuria, visual disturbances, vomiting and weight loss. Last A1C in the office was:  Lab Results  Component Value Date   HGBA1C 5.7 (H) 01/08/2018   Patient is on Vitamin D supplement.   Lab Results  Component Value Date   VD25OH 44 01/08/2018        Current Medications:  Current Outpatient Medications on File Prior to Visit  Medication Sig  . aspirin 81 MG tablet Take 81 mg by mouth every evening.   . cholecalciferol (VITAMIN D) 1000 units  tablet Take 5,000 Units by mouth daily.   . polyethylene glycol (MIRALAX / GLYCOLAX) packet Take 17 g by mouth daily.   . pseudoephedrine-acetaminophen (TYLENOL SINUS) 30-500 MG TABS tablet Take 1 tablet by mouth every 4 (four) hours as needed.  Marland Kitchen telmisartan (MICARDIS) 80 MG tablet Take 1 tablet  (80 mg total) by mouth daily.  . pantoprazole (PROTONIX) 40 MG tablet Take 1 tablet (40 mg total) by mouth 2 (two) times daily before a meal. (Patient not taking: Reported on 05/01/2018)  . rosuvastatin (CRESTOR) 20 MG tablet Take 1/2 to 1 tablet daily or as directed for Cholesterol (Patient not taking: Reported on 05/01/2018)  . vitamin C (ASCORBIC ACID) 500 MG tablet Take 500 mg by mouth daily.   No current facility-administered medications on file prior to visit.      Allergies:  Allergies  Allergen Reactions  . Fosamax [Alendronate Sodium] Itching  . Levofloxacin     Headache, chest pain, muscle aches  . Sulfa Antibiotics Itching  . Tramadol Itching     Medical History:  Past Medical History:  Diagnosis Date  . DJD (degenerative joint disease)   . GERD (gastroesophageal reflux disease)   . Hyperlipidemia   . Hypertension   . Pre-diabetes   . RLS (restless legs syndrome)    Family history- Reviewed and unchanged Social history- Reviewed and unchanged   Review of Systems:  Review of Systems  Constitutional: Negative for malaise/fatigue and weight loss.  HENT: Negative for hearing loss and tinnitus.   Eyes: Negative for blurred vision and double vision.  Respiratory: Negative for cough, shortness of breath and wheezing.   Cardiovascular: Negative for chest pain, palpitations, orthopnea, claudication and leg swelling.  Gastrointestinal: Negative for abdominal pain, blood in stool, constipation, diarrhea, heartburn, melena, nausea and vomiting.  Genitourinary: Negative.   Musculoskeletal: Negative for joint pain and myalgias.  Skin: Negative for rash.  Neurological: Negative for dizziness, tingling, sensory change, weakness and headaches.  Endo/Heme/Allergies: Negative for polydipsia.  Psychiatric/Behavioral: Negative for depression, hallucinations, memory loss, substance abuse and suicidal ideas. The patient has insomnia. The patient is not nervous/anxious.   All other  systems reviewed and are negative.     Physical Exam: BP 138/70   Pulse 84   Temp (!) 97.5 F (36.4 C)   Ht 5\' 6"  (1.676 m)   Wt 160 lb (72.6 kg)   SpO2 99%   BMI 25.82 kg/m  Wt Readings from Last 3 Encounters:  05/01/18 160 lb (72.6 kg)  02/06/18 161 lb (73 kg)  01/08/18 161 lb 3.2 oz (73.1 kg)   General Appearance: Well nourished, in no apparent distress. Eyes: PERRLA, EOMs, conjunctiva no swelling or erythema Sinuses: No Frontal/maxillary tenderness ENT/Mouth: Ext aud canals clear, TMs without erythema, bulging. No erythema, swelling, or exudate on post pharynx.  Tonsils not swollen or erythematous. Hearing normal.  Neck: Supple, thyroid normal.  Respiratory: Respiratory effort normal, BS equal bilaterally without rales, rhonchi, wheezing or stridor.  Cardio: RRR with no MRGs. Brisk peripheral pulses without edema.  Abdomen: Soft, + BS.  Non tender, no guarding, rebound, hernias, masses. Lymphatics: Non tender without lymphadenopathy.  Musculoskeletal: Full ROM, 5/5 strength, Normal gait Skin: Warm, dry without rashes, lesions, ecchymosis.  Neuro: Cranial nerves intact. No cerebellar symptoms.  Psych: Awake and oriented X 3, normal affect, Insight and Judgment appropriate.    Izora Ribas, NP 11:26 AM [facility]

## 2018-05-01 ENCOUNTER — Ambulatory Visit: Payer: Medicare Other | Admitting: Adult Health

## 2018-05-01 ENCOUNTER — Encounter: Payer: Self-pay | Admitting: Adult Health

## 2018-05-01 VITALS — BP 138/70 | HR 84 | Temp 97.5°F | Ht 66.0 in | Wt 160.0 lb

## 2018-05-01 DIAGNOSIS — R7303 Prediabetes: Secondary | ICD-10-CM

## 2018-05-01 DIAGNOSIS — K219 Gastro-esophageal reflux disease without esophagitis: Secondary | ICD-10-CM

## 2018-05-01 DIAGNOSIS — Z79899 Other long term (current) drug therapy: Secondary | ICD-10-CM

## 2018-05-01 DIAGNOSIS — E782 Mixed hyperlipidemia: Secondary | ICD-10-CM

## 2018-05-01 DIAGNOSIS — E559 Vitamin D deficiency, unspecified: Secondary | ICD-10-CM

## 2018-05-01 DIAGNOSIS — I10 Essential (primary) hypertension: Secondary | ICD-10-CM | POA: Diagnosis not present

## 2018-05-01 DIAGNOSIS — Z6826 Body mass index (BMI) 26.0-26.9, adult: Secondary | ICD-10-CM | POA: Diagnosis not present

## 2018-05-01 DIAGNOSIS — Z1159 Encounter for screening for other viral diseases: Secondary | ICD-10-CM

## 2018-05-01 MED ORDER — ROSUVASTATIN CALCIUM 20 MG PO TABS: ORAL_TABLET | ORAL | 1 refills | Status: DC

## 2018-05-01 MED ORDER — GABAPENTIN 300 MG PO CAPS: ORAL_CAPSULE | ORAL | 2 refills | Status: DC

## 2018-05-01 NOTE — Patient Instructions (Addendum)
Take 1 tab of gabapenting 1-2 hours before sleep - can help with back/nerve pain and help you to sleep through the night. Can increase to 2 tabs (600 mg) if needed - take earlier in the evening if you are waking up very drowsy in the morning.    Start taking crestor 1/2 tab once daily in the evening for cholesterol instead of simvastatin.      Gabapentin capsules or tablets What is this medicine? GABAPENTIN (GA ba pen tin) is used to control partial seizures in adults with epilepsy. It is also used to treat certain types of nerve pain. This medicine may be used for other purposes; ask your health care provider or pharmacist if you have questions. COMMON BRAND NAME(S): Active-PAC with Gabapentin, Gabarone, Neurontin What should I tell my health care provider before I take this medicine? They need to know if you have any of these conditions: -kidney disease -suicidal thoughts, plans, or attempt; a previous suicide attempt by you or a family member -an unusual or allergic reaction to gabapentin, other medicines, foods, dyes, or preservatives -pregnant or trying to get pregnant -breast-feeding How should I use this medicine? Take this medicine by mouth with a glass of water. Follow the directions on the prescription label. You can take it with or without food. If it upsets your stomach, take it with food.Take your medicine at regular intervals. Do not take it more often than directed. Do not stop taking except on your doctor's advice. If you are directed to break the 600 or 800 mg tablets in half as part of your dose, the extra half tablet should be used for the next dose. If you have not used the extra half tablet within 28 days, it should be thrown away. A special MedGuide will be given to you by the pharmacist with each prescription and refill. Be sure to read this information carefully each time. Talk to your pediatrician regarding the use of this medicine in children. Special care may be  needed. Overdosage: If you think you have taken too much of this medicine contact a poison control center or emergency room at once. NOTE: This medicine is only for you. Do not share this medicine with others. What if I miss a dose? If you miss a dose, take it as soon as you can. If it is almost time for your next dose, take only that dose. Do not take double or extra doses. What may interact with this medicine? Do not take this medicine with any of the following medications: -other gabapentin products This medicine may also interact with the following medications: -alcohol -antacids -antihistamines for allergy, cough and cold -certain medicines for anxiety or sleep -certain medicines for depression or psychotic disturbances -homatropine; hydrocodone -naproxen -narcotic medicines (opiates) for pain -phenothiazines like chlorpromazine, mesoridazine, prochlorperazine, thioridazine This list may not describe all possible interactions. Give your health care provider a list of all the medicines, herbs, non-prescription drugs, or dietary supplements you use. Also tell them if you smoke, drink alcohol, or use illegal drugs. Some items may interact with your medicine. What should I watch for while using this medicine? Visit your doctor or health care professional for regular checks on your progress. You may want to keep a record at home of how you feel your condition is responding to treatment. You may want to share this information with your doctor or health care professional at each visit. You should contact your doctor or health care professional if your seizures get  worse or if you have any new types of seizures. Do not stop taking this medicine or any of your seizure medicines unless instructed by your doctor or health care professional. Stopping your medicine suddenly can increase your seizures or their severity. Wear a medical identification bracelet or chain if you are taking this medicine for  seizures, and carry a card that lists all your medications. You may get drowsy, dizzy, or have blurred vision. Do not drive, use machinery, or do anything that needs mental alertness until you know how this medicine affects you. To reduce dizzy or fainting spells, do not sit or stand up quickly, especially if you are an older patient. Alcohol can increase drowsiness and dizziness. Avoid alcoholic drinks. Your mouth may get dry. Chewing sugarless gum or sucking hard candy, and drinking plenty of water will help. The use of this medicine may increase the chance of suicidal thoughts or actions. Pay special attention to how you are responding while on this medicine. Any worsening of mood, or thoughts of suicide or dying should be reported to your health care professional right away. Women who become pregnant while using this medicine may enroll in the [place] Pregnancy Registry by calling [phone]. This registry collects information about the safety of antiepileptic drug use during pregnancy. What side effects may I notice from receiving this medicine? Side effects that you should report to your doctor or health care professional as soon as possible: -allergic reactions like skin rash, itching or hives, swelling of the face, lips, or tongue -worsening of mood, thoughts or actions of suicide or dying Side effects that usually do not require medical attention (report to your doctor or health care professional if they continue or are bothersome): -constipation -difficulty walking or controlling muscle movements -dizziness -nausea -slurred speech -tiredness -tremors -weight gain This list may not describe all possible side effects. Call your doctor for medical advice about side effects. You may report side effects to FDA at 1-800-FDA-1088. Where should I keep my medicine? Keep out of reach of children. This medicine may cause accidental overdose and death if it taken by  other adults, children, or pets. Mix any unused medicine with a substance like cat litter or coffee grounds. Then throw the medicine away in a sealed container like a sealed bag or a coffee can with a lid. Do not use the medicine after the expiration date. Store at room temperature between 15 and 30 degrees C (59 and 86 degrees F). NOTE: This sheet is a summary. It may not cover all possible information. If you have questions about this medicine, talk to your doctor, pharmacist, or health care provider.  2018 Elsevier/Gold Standard (2014-01-23 15:26:50)   Rosuvastatin Tablets What is this medicine? ROSUVASTATIN (roe SOO va sta tin) is known as a HMG-CoA reductase inhibitor or 'statin'. It lowers cholesterol and triglycerides in the blood. This drug may also reduce the risk of heart attack, stroke, or other health problems in patients with risk factors for heart disease. Diet and lifestyle changes are often used with this drug. This medicine may be used for other purposes; ask your health care provider or pharmacist if you have questions. COMMON BRAND NAME(S): Crestor What should I tell my health care provider before I take this medicine? They need to know if you have any of these conditions: -frequently drink alcoholic beverages -kidney disease -liver disease -muscle aches or weakness -other medical condition -an unusual or allergic reaction to rosuvastatin, other medicines, foods, dyes,  or preservatives -pregnant or trying to get pregnant -breast-feeding How should I use this medicine? Take this medicine by mouth with a glass of water. Follow the directions on the prescription label. Do not cut, crush or chew this medicine. You can take this medicine with or without food. Take your doses at regular intervals. Do not take your medicine more often than directed. Talk to your pediatrician regarding the use of this medicine in children. While this drug may be prescribed for children as young as  40 years old for selected conditions, precautions do apply. Overdosage: If you think you have taken too much of this medicine contact a poison control center or emergency room at once. NOTE: This medicine is only for you. Do not share this medicine with others. What if I miss a dose? If you miss a dose, take it as soon as you can. Do not take 2 doses within 12 hours of each other. If there are less than 12 hours until your next dose, take only that dose. Do not take double or extra doses. What may interact with this medicine? Do not take this medicine with any of the following medications: -herbal medicines like red yeast rice This medicine may also interact with the following medications: -alcohol -antacids containing aluminum hydroxide or magnesium hydroxide -cyclosporine -other medicines for high cholesterol -some medicines for HIV infection -warfarin This list may not describe all possible interactions. Give your health care provider a list of all the medicines, herbs, non-prescription drugs, or dietary supplements you use. Also tell them if you smoke, drink alcohol, or use illegal drugs. Some items may interact with your medicine. What should I watch for while using this medicine? Visit your doctor or health care professional for regular check-ups. You may need regular tests to make sure your liver is working properly. Tell your doctor or health care professional right away if you get any unexplained muscle pain, tenderness, or weakness, especially if you also have a fever and tiredness. Your doctor or health care professional may tell you to stop taking this medicine if you develop muscle problems. If your muscle problems do not go away after stopping this medicine, contact your health care professional. This medicine may affect blood sugar levels. If you have diabetes, check with your doctor or health care professional before you change your diet or the dose of your diabetic medicine. Avoid  taking antacids containing aluminum, calcium or magnesium within 2 hours of taking this medicine. This drug is only part of a total heart-health program. Your doctor or a dietician can suggest a low-cholesterol and low-fat diet to help. Avoid alcohol and smoking, and keep a proper exercise schedule. Do not use this drug if you are pregnant or breast-feeding. Serious side effects to an unborn child or to an infant are possible. Talk to your doctor or pharmacist for more information. What side effects may I notice from receiving this medicine? Side effects that you should report to your doctor or health care professional as soon as possible: -allergic reactions like skin rash, itching or hives, swelling of the face, lips, or tongue -dark urine -fever -joint pain -muscle cramps, pain -redness, blistering, peeling or loosening of the skin, including inside the mouth -trouble passing urine or change in the amount of urine -unusually weak or tired -yellowing of the eyes or skin Side effects that usually do not require medical attention (report to your doctor or health care professional if they continue or are bothersome): -constipation -heartburn -nausea -  stomach gas, pain, upset This list may not describe all possible side effects. Call your doctor for medical advice about side effects. You may report side effects to FDA at 1-800-FDA-1088. Where should I keep my medicine? Keep out of the reach of children. Store at room temperature between 20 and 25 degrees C (68 and 77 degrees F). Keep container tightly closed (protect from moisture). Throw away any unused medicine after the expiration date. NOTE: This sheet is a summary. It may not cover all possible information. If you have questions about this medicine, talk to your doctor, pharmacist, or health care provider.  2018 Elsevier/Gold Standard (2015-05-13 13:33:08)

## 2018-05-02 ENCOUNTER — Other Ambulatory Visit: Payer: Self-pay

## 2018-05-02 DIAGNOSIS — E782 Mixed hyperlipidemia: Secondary | ICD-10-CM

## 2018-05-02 LAB — HEMOGLOBIN A1C
Hgb A1c MFr Bld: 5.8 % of total Hgb — ABNORMAL HIGH (ref <5.7)
Mean Plasma Glucose: 120 (calc)
eAG (mmol/L): 6.6 (calc)

## 2018-05-02 LAB — COMPLETE METABOLIC PANEL WITH GFR
AG Ratio: 1.6 (calc) (ref 1.0–2.5)
ALKALINE PHOSPHATASE (APISO): 87 U/L (ref 33–130)
ALT: 7 U/L (ref 6–29)
AST: 15 U/L (ref 10–35)
Albumin: 4.2 g/dL (ref 3.6–5.1)
BUN: 11 mg/dL (ref 7–25)
CO2: 30 mmol/L (ref 20–32)
CREATININE: 0.74 mg/dL (ref 0.60–0.93)
Calcium: 9.8 mg/dL (ref 8.6–10.4)
Chloride: 104 mmol/L (ref 98–110)
GFR, EST NON AFRICAN AMERICAN: 81 mL/min/{1.73_m2} (ref >=60)
GFR, Est African American: 94 mL/min/{1.73_m2} (ref >=60)
GLUCOSE: 113 mg/dL — AB (ref 65–99)
Globulin: 2.6 g/dL (calc) (ref 1.9–3.7)
POTASSIUM: 3.8 mmol/L (ref 3.5–5.3)
Sodium: 142 mmol/L (ref 135–146)
Total Bilirubin: 0.7 mg/dL (ref 0.2–1.2)
Total Protein: 6.8 g/dL (ref 6.1–8.1)

## 2018-05-02 LAB — CBC WITH DIFFERENTIAL/PLATELET
Basophils Absolute: 41 cells/uL (ref 0–200)
Basophils Relative: 1.4 %
EOS ABS: 70 {cells}/uL (ref 15–500)
Eosinophils Relative: 2.4 %
HCT: 37.7 % (ref 35.0–45.0)
Hemoglobin: 12.3 g/dL (ref 11.7–15.5)
Lymphs Abs: 821 cells/uL — ABNORMAL LOW (ref 850–3900)
MCH: 25.9 pg — ABNORMAL LOW (ref 27.0–33.0)
MCHC: 32.6 g/dL (ref 32.0–36.0)
MCV: 79.5 fL — ABNORMAL LOW (ref 80.0–100.0)
MONOS PCT: 7.6 %
MPV: 11.3 fL (ref 7.5–12.5)
NEUTROS PCT: 60.3 %
Neutro Abs: 1749 cells/uL (ref 1500–7800)
PLATELETS: 261 10*3/uL (ref 140–400)
RBC: 4.74 10*6/uL (ref 3.80–5.10)
RDW: 13.2 % (ref 11.0–15.0)
TOTAL LYMPHOCYTE: 28.3 %
WBC: 2.9 10*3/uL — AB (ref 3.8–10.8)
WBCMIX: 220 {cells}/uL (ref 200–950)

## 2018-05-02 LAB — HEPATITIS C ANTIBODY
Hepatitis C Ab: NONREACTIVE
SIGNAL TO CUT-OFF: 0.02 (ref <1.00)

## 2018-05-02 LAB — MAGNESIUM: MAGNESIUM: 2 mg/dL (ref 1.5–2.5)

## 2018-05-02 LAB — LIPID PANEL
CHOL/HDL RATIO: 2.7 (calc) (ref <5.0)
CHOLESTEROL: 182 mg/dL (ref <200)
HDL: 68 mg/dL (ref >50)
LDL CHOLESTEROL (CALC): 99 mg/dL
Non-HDL Cholesterol (Calc): 114 mg/dL (calc) (ref <130)
Triglycerides: 61 mg/dL (ref <150)

## 2018-05-02 LAB — TSH: TSH: 1.75 mIU/L (ref 0.40–4.50)

## 2018-05-02 MED ORDER — ROSUVASTATIN CALCIUM 20 MG PO TABS: ORAL_TABLET | ORAL | 1 refills | Status: DC

## 2018-05-07 ENCOUNTER — Ambulatory Visit (INDEPENDENT_AMBULATORY_CARE_PROVIDER_SITE_OTHER): Payer: Medicare Other | Admitting: Orthopaedic Surgery

## 2018-05-07 ENCOUNTER — Encounter (INDEPENDENT_AMBULATORY_CARE_PROVIDER_SITE_OTHER): Payer: Self-pay | Admitting: Orthopaedic Surgery

## 2018-05-07 VITALS — BP 183/87 | HR 88 | Ht 65.0 in | Wt 159.0 lb

## 2018-05-07 DIAGNOSIS — M25561 Pain in right knee: Secondary | ICD-10-CM

## 2018-05-07 NOTE — Progress Notes (Signed)
Office Visit Note   Patient: Emily Jenkins           Date of Birth: September 05, 1946           MRN: [id number] Visit Date: 05/07/2018              Requested by: Unk Pinto, [address] [place], [place] [address] PCP: Unk Pinto, MD   Assessment & Plan: Visit Diagnoses:  1. Right knee pain, unspecified chronicity     Plan: Injection performed right knee with good relief.  She can return if she has increased symptoms.  Repeat radiographs were deferred today.   Follow-Up Instructions: Return if symptoms worsen or fail to improve.   Orders:  No orders of the defined types were placed in this encounter.  No orders of the defined types were placed in this encounter.     Procedures: No procedures performed   Clinical Data: No additional findings.   Subjective: Chief Complaint  Patient presents with  . Left Shoulder - Pain, Follow-up  . Lower Back - Pain, Follow-up    HPI 72 year old female returns she states her left shoulder is doing significantly better since her February injection.  She can get her arm up overhead.  Some weakness in her right leg and states she has problems going up and down steps occasionally has had catching in her right knee.  Had used some diclofenac which gave her some relief.  Her primary care physician is Dr. better hypertension today her blood pressure is elevated 183/87 but she did not take her a.m. blood pressure medication.  Discussed therapy but she states it was cost prohibitive.  Review of Systems reviewed updated unchanged from last visit.   Objective: Vital Signs: BP (!) 183/87   Pulse 88   Ht 5\' 5"  (1.651 m)   Wt 159 lb (72.1 kg)   BMI 26.46 kg/m   Physical Exam  Constitutional: She is oriented to person, place, and time. She appears well-developed.  HENT:  Head: Normocephalic.  Right Ear: External ear normal.  Left Ear: External ear normal.  Eyes: Pupils are equal, round, and reactive to  light.  Neck: No tracheal deviation present. No thyromegaly present.  Cardiovascular: Normal rate.  Pulmonary/Chest: Effort normal.  Abdominal: Soft.  Neurological: She is alert and oriented to person, place, and time.  Skin: Skin is warm and dry.  Psychiatric: She has a normal mood and affect. Her behavior is normal.    Ortho Exam well-healed arthroscopic portals.  No pain with hip range of motion.  Mild sciatic notch tenderness.  Bursal tenderness distal pulses are 2+ and palpable.  Compression test.  Crepitus with knee flexion extension.  Specialty Comments:  No specialty comments available.  Imaging: No results found.   PMFS History: Patient Active Problem List   Diagnosis Date Noted  . Exertional dyspnea 09/22/2017  . Depression 09/22/2017  . BMI 26.0-26.9,adult 10/29/2015  . Osteoporosis 06/07/2015  . GERD  03/04/2015  . Medication management 01/05/2014  . Essential hypertension 12/02/2013  . Mixed hyperlipidemia 12/02/2013  . Prediabetes 12/02/2013  . Vitamin D deficiency 12/02/2013  . Varicose veins of both lower extremities 09/16/2012   Past Medical History:  Diagnosis Date  . DJD (degenerative joint disease)   . GERD (gastroesophageal reflux disease)   . Hyperlipidemia   . Hypertension   . Pre-diabetes   . RLS (restless legs syndrome)     Family History  Problem Relation Age of Onset  .  Heart disease Mother 60       stent  . Hypertension Mother   . Cancer Father   . Diabetes Father   . Hyperlipidemia Sister   . Hypertension Sister     Past Surgical History:  Procedure Laterality Date  . ANKLE SURGERY    . CHOLECYSTECTOMY    . LEFT HEART CATHETERIZATION WITH CORONARY ANGIOGRAM N/A 07/29/2014   Procedure: LEFT HEART CATHETERIZATION WITH CORONARY ANGIOGRAM;  Surgeon: Burnell Blanks, MD;  Location: [facility] CATH [facility];  Service: Cardiovascular;  Laterality: N/A;  . TUBAL LIGATION     Social History   Occupational History  . Not on file  Tobacco  Use  . Smoking status: Never Smoker  . Smokeless tobacco: Never Used  . Tobacco comment: SPOUSE SMOKES  Substance and Sexual Activity  . Alcohol use: No    Alcohol/week: 0.0 oz  . Drug use: No  . Sexual activity: Yes

## 2018-06-06 ENCOUNTER — Other Ambulatory Visit: Payer: Self-pay | Admitting: Physician Assistant

## 2018-06-06 DIAGNOSIS — R51 Headache: Principal | ICD-10-CM

## 2018-06-06 DIAGNOSIS — R519 Headache, unspecified: Secondary | ICD-10-CM

## 2018-06-06 DIAGNOSIS — I10 Essential (primary) hypertension: Secondary | ICD-10-CM

## 2018-06-24 ENCOUNTER — Telehealth: Payer: Self-pay | Admitting: Internal Medicine

## 2018-06-24 NOTE — Telephone Encounter (Signed)
Patient called by online DME company, [employer] faxed our office Requesting Brace for back pain/shoulder pain. Per Dr Melford Aase please have patient call Dr Rodell Perna for appropreiate recommendation for braces, Patient agrees and will call [place], her hx orthopedic provider.

## 2018-07-29 ENCOUNTER — Ambulatory Visit: Payer: Self-pay | Admitting: Internal Medicine

## 2018-08-08 ENCOUNTER — Encounter: Payer: Self-pay | Admitting: Internal Medicine

## 2018-08-08 ENCOUNTER — Ambulatory Visit: Payer: Medicare Other | Admitting: Internal Medicine

## 2018-08-08 VITALS — BP 140/74 | HR 85 | Temp 98.5°F | Resp 16 | Ht 65.0 in | Wt 159.8 lb

## 2018-08-08 DIAGNOSIS — E559 Vitamin D deficiency, unspecified: Secondary | ICD-10-CM

## 2018-08-08 DIAGNOSIS — I10 Essential (primary) hypertension: Secondary | ICD-10-CM

## 2018-08-08 DIAGNOSIS — E782 Mixed hyperlipidemia: Secondary | ICD-10-CM

## 2018-08-08 DIAGNOSIS — Z79899 Other long term (current) drug therapy: Secondary | ICD-10-CM

## 2018-08-08 DIAGNOSIS — R7309 Other abnormal glucose: Secondary | ICD-10-CM

## 2018-08-08 DIAGNOSIS — R7303 Prediabetes: Secondary | ICD-10-CM

## 2018-08-08 DIAGNOSIS — K219 Gastro-esophageal reflux disease without esophagitis: Secondary | ICD-10-CM

## 2018-08-08 NOTE — Patient Instructions (Signed)

## 2018-08-08 NOTE — Progress Notes (Signed)
This very nice 72 y.o. [name]  presents for 6 month follow up with HTN, HLD, Pre-Diabetes and Vitamin D Deficiency.      Patient is treated for HTN  (1990's) & BP has been controlled at home. Today's BP is at goal -  140/74. Patient has had no complaints of any cardiac type chest pain, palpitations, dyspnea / orthopnea / PND, dizziness, claudication, or dependent edema.     Hyperlipidemia is controlled with diet & meds. Patient denies myalgias or other med SE's. Last Lipids were at goal: Lab Results  Component Value Date   CHOL 182 05/01/2018   HDL 68 05/01/2018   LDLCALC 99 05/01/2018   TRIG 61 05/01/2018   CHOLHDL 2.7 05/01/2018      Also, the patient has history of PreDiabetes (A1c 5.9%/2013) and has had no symptoms of reactive hypoglycemia, diabetic polys, paresthesias or visual blurring.  Last A1c was not at goal: Lab Results  Component Value Date   HGBA1C 5.8 (H) 05/01/2018      Further, the patient also has history of Vitamin D Deficiency ("41"/2014) and supplements vitamin D without any suspected side-effects. Last vitamin D was   Lab Results  Component Value Date   VD25OH 44 01/08/2018   Current Outpatient Medications on File Prior to Visit  Medication Sig  . aspirin 81 MG tablet Take 81 mg by mouth every evening.   . cholecalciferol (VITAMIN D) 1000 units tablet Take 5,000 Units by mouth daily.   . polyethylene glycol (MIRALAX / GLYCOLAX) packet Take 17 g by mouth daily.   . pseudoephedrine-acetaminophen (TYLENOL SINUS) 30-500 MG TABS tablet Take 1 tablet by mouth every 4 (four) hours as needed.  Marland Kitchen telmisartan (MICARDIS) 80 MG tablet TAKE 1 TABLET BY MOUTH DAILY  . vitamin C (ASCORBIC ACID) 500 MG tablet Take 500 mg by mouth daily.  . pantoprazole (PROTONIX) 40 MG tablet Take 1 tablet (40 mg total) by mouth 2 (two) times daily before a meal. (Patient not taking: Reported on 05/01/2018)   No current facility-administered medications on file prior to visit.    Allergies    Allergen Reactions  . Fosamax [Alendronate Sodium] Itching  . Levofloxacin     Headache, chest pain, muscle aches  . Sulfa Antibiotics Itching  . Tramadol Itching   PMHx:   Past Medical History:  Diagnosis Date  . DJD (degenerative joint disease)   . GERD (gastroesophageal reflux disease)   . Hyperlipidemia   . Hypertension   . Pre-diabetes   . RLS (restless legs syndrome)    Immunization History  Administered Date(s) Administered  . DTaP 12/11/2000  . Influenza Split 10/24/2013  . Influenza, High Dose Seasonal PF 09/17/2014, 09/09/2015, 08/21/2016, 09/21/2017  . Pneumococcal Conjugate-13 08/21/2016  . Pneumococcal Polysaccharide-23 12/11/2002, 01/08/2018  . Tdap 07/11/2012  . Zoster 08/06/2013   Past Surgical History:  Procedure Laterality Date  . ANKLE SURGERY    . CHOLECYSTECTOMY    . LEFT HEART CATHETERIZATION WITH CORONARY ANGIOGRAM N/A 07/29/2014   Procedure: LEFT HEART CATHETERIZATION WITH CORONARY ANGIOGRAM;  Surgeon: Burnell Blanks, MD;  Location: [facility] CATH LAB;  Service: Cardiovascular;  Laterality: N/A;  . TUBAL LIGATION     FHx:    Reviewed / unchanged  SHx:    Reviewed / unchanged   Systems Review:  Constitutional: Denies fever, chills, wt changes, headaches, insomnia, fatigue, night sweats, change in appetite. Eyes: Denies redness, blurred vision, diplopia, discharge, itchy, watery eyes.  ENT: Denies discharge, congestion,  post nasal drip, epistaxis, sore throat, earache, hearing loss, dental pain, tinnitus, vertigo, sinus pain, snoring.  CV: Denies chest pain, palpitations, irregular heartbeat, syncope, dyspnea, diaphoresis, orthopnea, PND, claudication or edema. Respiratory: denies cough, dyspnea, DOE, pleurisy, hoarseness, laryngitis, wheezing.  Gastrointestinal: Denies dysphagia, odynophagia, heartburn, reflux, water brash, abdominal pain or cramps, nausea, vomiting, bloating, diarrhea, constipation, hematemesis, melena, hematochezia  or  hemorrhoids. Genitourinary: Denies dysuria, frequency, urgency, nocturia, hesitancy, discharge, hematuria or flank pain. Musculoskeletal: Denies arthralgias, myalgias, stiffness, jt. swelling, pain, limping or strain/sprain.  Skin: Denies pruritus, rash, hives, warts, acne, eczema or change in skin lesion(s). Neuro: No weakness, tremor, incoordination, spasms, paresthesia or pain. Psychiatric: Denies confusion, memory loss or sensory loss. Endo: Denies change in weight, skin or hair change.  Heme/Lymph: No excessive bleeding, bruising or enlarged lymph nodes.  Physical Exam  BP 140/74   Pulse 85   Temp 98.5 F (36.9 C)   Resp 16   Ht 5\' 5"  (1.651 m)   Wt 159 lb 12.8 oz (72.5 kg)   SpO2 98%   BMI 26.59 kg/m   Appears  well nourished, well groomed  and in no distress.  Eyes: PERRLA, EOMs, conjunctiva no swelling or erythema. Sinuses: No frontal/maxillary tenderness ENT/Mouth: EAC's clear, TM's nl w/o erythema, bulging. Nares clear w/o erythema, swelling, exudates. Oropharynx clear without erythema or exudates. Oral hygiene is good. Tongue normal, non obstructing. Hearing intact.  Neck: Supple. Thyroid not palpable. Car 2+/2+ without bruits, nodes or JVD. Chest: Respirations nl with BS clear & equal w/o rales, rhonchi, wheezing or stridor.  Cor: Heart sounds normal w/ regular rate and rhythm without sig. murmurs, gallops, clicks or rubs. Peripheral pulses normal and equal  without edema.  Abdomen: Soft & bowel sounds normal. Non-tender w/o guarding, rebound, hernias, masses or organomegaly.  Lymphatics: Unremarkable.  Musculoskeletal: Full ROM all peripheral extremities, joint stability, 5/5 strength and normal gait.  Skin: Warm, dry without exposed rashes, lesions or ecchymosis apparent.  Neuro: Cranial nerves intact, reflexes equal bilaterally. Sensory-motor testing grossly intact. Tendon reflexes grossly intact.  Pysch: Alert & oriented x 3.  Insight and judgement nl & appropriate.  No ideations.  Assessment and Plan:  1. Essential hypertension  - Continue medication, monitor blood pressure at home.  - Continue DASH diet.  Reminder to go to the ER if any CP,  SOB, nausea, dizziness, severe HA, changes vision/speech.  - CBC with Differential/Platelet - COMPLETE METABOLIC PANEL WITH GFR - Magnesium - TSH  2. Hyperlipidemia, mixed  - Continue diet/meds, exercise,& lifestyle modifications.  - Continue monitor periodic cholesterol/liver & renal functions   - Lipid panel - TSH  3. Abnormal glucose  - Continue diet, exercise, lifestyle modifications.  - Monitor appropriate labs.  - Hemoglobin A1c - Insulin, random  4. Vitamin D deficiency  - Continue supplementation.   - VITAMIN D 25 Hydroxyl  5. Prediabetes  - Hemoglobin A1c - Insulin, random  6. Gastroesophageal reflux disease  - CBC with Differential/Platelet  7. Medication management  - CBC with Differential/Platelet - COMPLETE METABOLIC PANEL WITH GFR - Magnesium - Lipid panel - TSH - Hemoglobin A1c - Insulin, random - VITAMIN D 25 Hydroxyl       Discussed  regular exercise, BP monitoring, weight control to achieve/maintain BMI less than 25 and discussed med and SE's. Recommended labs to assess and monitor clinical status with further disposition pending results of labs. Over 30 minutes of exam, counseling, chart review was performed.

## 2018-08-09 LAB — CBC WITH DIFFERENTIAL/PLATELET
BASOS PCT: 1 %
Basophils Absolute: 29 cells/uL (ref 0–200)
Eosinophils Absolute: 81 cells/uL (ref 15–500)
Eosinophils Relative: 2.8 %
HCT: 38.4 % (ref 35.0–45.0)
Hemoglobin: 12.5 g/dL (ref 11.7–15.5)
Lymphs Abs: 809 cells/uL — ABNORMAL LOW (ref 850–3900)
MCH: 26.2 pg — ABNORMAL LOW (ref 27.0–33.0)
MCHC: 32.6 g/dL (ref 32.0–36.0)
MCV: 80.3 fL (ref 80.0–100.0)
MONOS PCT: 7.2 %
MPV: 11.6 fL (ref 7.5–12.5)
NEUTROS PCT: 61.1 %
Neutro Abs: 1772 cells/uL (ref 1500–7800)
PLATELETS: 262 10*3/uL (ref 140–400)
RBC: 4.78 10*6/uL (ref 3.80–5.10)
RDW: 14.4 % (ref 11.0–15.0)
TOTAL LYMPHOCYTE: 27.9 %
WBC mixed population: 209 cells/uL (ref 200–950)
WBC: 2.9 10*3/uL — ABNORMAL LOW (ref 3.8–10.8)

## 2018-08-09 LAB — VITAMIN D 25 HYDROXY (VIT D DEFICIENCY, FRACTURES): VIT D 25 HYDROXY: 70 ng/mL (ref 30–100)

## 2018-08-09 LAB — COMPLETE METABOLIC PANEL WITH GFR
AG RATIO: 1.8 (calc) (ref 1.0–2.5)
ALT: 8 U/L (ref 6–29)
AST: 15 U/L (ref 10–35)
Albumin: 4.2 g/dL (ref 3.6–5.1)
Alkaline phosphatase (APISO): 83 U/L (ref 33–130)
BUN: 11 mg/dL (ref 7–25)
CALCIUM: 10 mg/dL (ref 8.6–10.4)
CO2: 31 mmol/L (ref 20–32)
CREATININE: 0.8 mg/dL (ref 0.60–0.93)
Chloride: 106 mmol/L (ref 98–110)
GFR, EST AFRICAN AMERICAN: 85 mL/min/{1.73_m2} (ref >=60)
GFR, Est Non African American: 74 mL/min/{1.73_m2} (ref >=60)
GLUCOSE: 84 mg/dL (ref 65–99)
Globulin: 2.4 g/dL (calc) (ref 1.9–3.7)
Potassium: 4.1 mmol/L (ref 3.5–5.3)
Sodium: 143 mmol/L (ref 135–146)
TOTAL PROTEIN: 6.6 g/dL (ref 6.1–8.1)
Total Bilirubin: 0.5 mg/dL (ref 0.2–1.2)

## 2018-08-09 LAB — LIPID PANEL
CHOL/HDL RATIO: 2.4 (calc) (ref <5.0)
Cholesterol: 168 mg/dL (ref <200)
HDL: 69 mg/dL (ref >50)
LDL Cholesterol (Calc): 87 mg/dL (calc)
NON-HDL CHOLESTEROL (CALC): 99 mg/dL (ref <130)
TRIGLYCERIDES: 50 mg/dL (ref <150)

## 2018-08-09 LAB — MAGNESIUM: Magnesium: 1.9 mg/dL (ref 1.5–2.5)

## 2018-08-09 LAB — HEMOGLOBIN A1C
HEMOGLOBIN A1C: 5.7 %{Hb} — AB (ref <5.7)
MEAN PLASMA GLUCOSE: 117 (calc)
eAG (mmol/L): 6.5 (calc)

## 2018-08-09 LAB — TSH: TSH: 1.73 mIU/L (ref 0.40–4.50)

## 2018-08-09 LAB — INSULIN, RANDOM: Insulin: 2.2 u[IU]/mL (ref 2.0–19.6)

## 2018-08-12 ENCOUNTER — Encounter: Payer: Self-pay | Admitting: Internal Medicine

## 2018-09-23 NOTE — Progress Notes (Signed)
   Subjective:    Patient ID: Emily Jenkins, female    DOB: April 05, 1946, 72 y.o.   MRN: [id number]  HPI    This very nice 72 yo [name]  Presents with c/o head & chest congestion and also has been having GERD sx's. Cough is minomally productive with a yellowish sputum.     Review of Systems     Objective:   Physical Exam  BP (!) 156/80   Pulse 92   Temp (!) 97.5 F (36.4 C)   Resp 16   Ht 5\' 5"  (1.651 m)   Wt 161 lb 9.6 oz (73.3 kg)   BMI 26.89 kg/m   Congested cough. No hoarseness. No respiratory distress.   HEENT - EAC's/TTM's - Nl. (+) fronto-maxillary tenderness. N/O/P clear.  Neck - supple.  Chest - Few scattered rales & rhonchi. Cor - Nl HS. RRR w/o sig MGR. PP 1(+). No edema. MS- FROM w/o deformities.  Gait Nl. Abd - Soft w/o masses or tenderness.  Neuro -  Nl w/o focal abnormalities. Skin No Rash w/o cyanosis    Assessment & Plan:   1. Subacute pansinusitis  - predniSONE (DELTASONE) 20 MG tablet; 1 tab 3 x day for 3 days, then 1 tab 2 x day for 3 days, then 1 tab 1 x day for 5 days  Dispense: 20 tablet; Refill: 0  - azithromycin (ZITHROMAX) 250 MG tablet; Take 2 tablets (500 mg) on  Day 1,  followed by 1 tablet (250 mg) once daily on Days 2 through 5.  Dispense: 6 each; Refill: 1  2. Tracheitis  - predniSONE (DELTASONE) 20 MG tablet; 1 tab 3 x day for 3 days, then 1 tab 2 x day for 3 days, then 1 tab 1 x day for 5 days  Dispense: 20 tablet; Refill: 0  - azithromycin (ZITHROMAX) 250 MG tablet; Take 2 tablets (500 mg) on  Day 1,  followed by 1 tablet (250 mg) once daily on Days 2 through 5.  Dispense: 6 each; Refill: 1  3. GERD with esophagitis  - pantoprazole (PROTONIX) 40 MG tablet; Take 1 tablet 2 x /day for Acid Indigestion & Reflux  Dispense: 180 tablet; Refill: 3 - discussed anti-dyspeptic diet     .

## 2018-09-24 ENCOUNTER — Ambulatory Visit: Payer: Medicare Other | Admitting: Internal Medicine

## 2018-09-24 VITALS — BP 156/80 | HR 92 | Temp 97.5°F | Resp 16 | Ht 65.0 in | Wt 161.6 lb

## 2018-09-24 DIAGNOSIS — K2100 Gastro-esophageal reflux disease with esophagitis, without bleeding: Secondary | ICD-10-CM

## 2018-09-24 DIAGNOSIS — J0140 Acute pansinusitis, unspecified: Secondary | ICD-10-CM

## 2018-09-24 DIAGNOSIS — J0410 Acute tracheitis without obstruction: Secondary | ICD-10-CM | POA: Diagnosis not present

## 2018-09-24 MED ORDER — PANTOPRAZOLE SODIUM 40 MG PO TBEC: DELAYED_RELEASE_TABLET | ORAL | 3 refills | Status: DC

## 2018-09-24 MED ORDER — PREDNISONE 20 MG PO TABS: ORAL_TABLET | ORAL | 0 refills | Status: DC

## 2018-09-24 MED ORDER — AZITHROMYCIN 250 MG PO TABS: ORAL_TABLET | ORAL | 1 refills | Status: DC

## 2018-09-28 ENCOUNTER — Encounter: Payer: Self-pay | Admitting: Internal Medicine

## 2018-10-03 ENCOUNTER — Ambulatory Visit (INDEPENDENT_AMBULATORY_CARE_PROVIDER_SITE_OTHER): Payer: Medicare Other | Admitting: Internal Medicine

## 2018-10-03 VITALS — BP 142/80 | HR 76 | Temp 97.1°F | Resp 16 | Ht 65.0 in | Wt 160.8 lb

## 2018-10-03 DIAGNOSIS — I10 Essential (primary) hypertension: Secondary | ICD-10-CM

## 2018-10-03 DIAGNOSIS — B070 Plantar wart: Secondary | ICD-10-CM

## 2018-10-03 MED ORDER — HYDROCODONE-ACETAMINOPHEN 5-300 MG PO TABS: ORAL_TABLET | ORAL | 0 refills | Status: DC

## 2018-10-06 ENCOUNTER — Encounter: Payer: Self-pay | Admitting: Internal Medicine

## 2018-10-06 NOTE — Progress Notes (Signed)
  Subjective:    Patient ID: Emily Jenkins, female    DOB: 11-10-1946, 72 y.o.   MRN: [id number]  HPI   This very nice 72 yo [name] with HTN presents for recheck. BP is 142/80  & normal. She denies any sx's of HA's , dizziness, CP, palpitations , dyspnea or edema. She also has c/o of 2 painful areas on the metatarsal sole of the Lt foot   Medication Sig  . aspirin 81 MG tablet Take 81 mg by mouth every evening.   . cholecalciferol (VITAMIN D) 1000 units tablet Take 5,000 Units by mouth daily.   . pantoprazole (PROTONIX) 40 MG tablet Take 1 tablet 2 x /day for Acid Indigestion & Reflux  . polyethylene glycol (MIRALAX / GLYCOLAX) packet Take 17 g by mouth daily.   . predniSONE (DELTASONE) 20 MG tablet 1 tab 3 x day for 3 days, then 1 tab 2 x day for 3 days, then 1 tab 1 x day for 5 days  . pseudoephedrine-acetaminophen (TYLENOL SINUS) 30-500 MG TABS tablet Take 1 tablet by mouth every 4 (four) hours as needed.  Marland Kitchen telmisartan (MICARDIS) 80 MG tablet TAKE 1 TABLET BY MOUTH DAILY  . vitamin C (ASCORBIC ACID) 500 MG tablet Take 500 mg by mouth daily.  Marland Kitchen azithromycin (ZITHROMAX) 250 MG tablet Take 2 tablets (500 mg) on  Day 1,  followed by 1 tablet (250 mg) once daily on Days 2 through 5.   No facility-administered medications prior to visit.    Allergies  Allergen Reactions  . Fosamax [Alendronate Sodium] Itching  . Levofloxacin     Headache, chest pain, muscle aches  . Sulfa Antibiotics Itching  . Tramadol Itching   Past Medical History:  Diagnosis Date  . DJD (degenerative joint disease)   . GERD (gastroesophageal reflux disease)   . Hyperlipidemia   . Hypertension   . Pre-diabetes   . RLS (restless legs syndrome)    Review of Systems     10 point systems review negative except as above.    Objective:   Physical Exam  BP (!) 142/80   Pulse 76   Temp (!) 97.1 F (36.2 C)   Resp 16   Ht 5\' 5"  (1.651 m)   Wt 160 lb 12.8 oz (72.9 kg)   BMI 26.76 kg/m   HEENT - WNL. Neck -  supple.  Chest - Clear equal BS. Cor - Nl HS. RRR w/o sig MGR. PP 1(+). No edema. MS- FROM w/o deformities.  Gait Nl. Neuro -  Nl w/o focal abnormalities. Skin - There are # 2 very tender hyperkeratoses (12 mm x 14 mm) of the Lt Plantar sole at the 3rd & 5th MTP areas.   Procedure (CPT- 41423 x 2)     After informed consent and aseptic prep with alcohol, each of the apparent plantar warts were sharply excised with a #10 scalpel and then the wound bases were deeply hyfrecated (setting #10) for hemostasis and destruction of an remnant lesion. Sterile dressings were applied.  Patient was instructed in post-op wound care.    Assessment & Plan:   1. Essential hypertension  2. Plantar warts, Lt foot x 2

## 2018-10-06 NOTE — Progress Notes (Signed)
Patient ID: Emily Jenkins, female   DOB: 1946/06/11, 72 y.o.   MRN: [id number]

## 2018-10-22 ENCOUNTER — Other Ambulatory Visit: Payer: Self-pay

## 2018-10-23 ENCOUNTER — Encounter: Payer: Self-pay | Admitting: Internal Medicine

## 2018-10-23 ENCOUNTER — Ambulatory Visit: Payer: Medicare Other | Admitting: Internal Medicine

## 2018-10-23 VITALS — BP 132/68 | HR 88 | Temp 97.9°F | Ht 65.0 in | Wt 163.8 lb

## 2018-10-23 DIAGNOSIS — B070 Plantar wart: Secondary | ICD-10-CM

## 2018-10-23 NOTE — Progress Notes (Signed)
   Patient concerned wart removal site not fully healed in yet   Wound appears clean and healing as expected w/o signs of infection.  - encouraged continue daily wound care and dressing changes  and advisedmay take 2-3 weeks to complete healing as wound site was initially about 15 mm diameter  (No Charge)

## 2018-10-29 ENCOUNTER — Telehealth: Payer: Self-pay | Admitting: Internal Medicine

## 2018-10-29 NOTE — Telephone Encounter (Signed)
patient requesting order for Bone Density Scan be faxed to [facility] Bone Density Scan, dx: osteoporosis and screening mammogram order  faxed to [facility].

## 2018-11-06 NOTE — Progress Notes (Signed)
MEDICARE ANNUAL WELLNESS VISIT AND FOLLOW UP  Assessment:   Diagnoses and all orders for this visit:  Encounter for Medicare annual wellness exam  Varicose veins of both lower extremities, unspecified whether complicated - weight loss discussed, continue compression stockings and elevation  Essential hypertension Continue medication Monitor blood pressure at home; call if consistently over 130/80 Continue DASH diet.   Reminder to go to the ER if any CP, SOB, nausea, dizziness, severe HA, changes vision/speech, left arm numbness and tingling and jaw pain.  Gastroesophageal reflux disease, esophagitis presence not specified Well managed on current medications Discussed diet, avoiding triggers and other lifestyle changes  Age-related osteoporosis without current pathological fracture - getting dexa today, didn't tolerate fosamax, continue Vit D and Ca, weight bearing exercises  Vitamin D deficiency At goal at recent check; continue to recommend supplementation for goal of 70-100 Defer vitamin D level  Prediabetes Discussed disease and risks Discussed diet/exercise, weight management  A1C  Mixed hyperlipidemia Controlled off of medications Continue low cholesterol diet and exercise.  Check lipid panel.   Medication management CBC, CMP/GFR, magnesium   Reactive depression Initiate lexapro 10 mg daily, follow up 3 months Lifestyle discussed: diet/exerise, sleep hygiene, stress management, hydration  Overweight (BMI 25.0-29.9) Long discussion about weight loss, diet, and exercise Recommended diet heavy in fruits and veggies and low in animal meats, cheeses, and dairy products, appropriate calorie intake Discussed appropriate weight for height and initial goal  Follow up at next visit  R knee arthritis Follow up Dr. Berenice Primas, getting injections  Over 40 minutes of exam, counseling, chart review and critical decision making was performed Future Appointments  Date Time  Provider [place]  02/12/2019  2:00 PM Unk Pinto, MD [facility] None    Plan:   During the course of the visit the patient was educated and counseled about appropriate screening and preventive services including:    Pneumococcal vaccine   Prevnar 13  Influenza vaccine  Td vaccine  Screening electrocardiogram  Bone densitometry screening  Colorectal cancer screening  Diabetes screening  Glaucoma screening  Nutrition counseling   Advanced directives: requested   Subjective:  Emily Jenkins is a 72 y.o. female who presents for Medicare Annual Wellness Visit and 3 month follow up.   Discussed her result of PHQ-9 of 5, improved from 9 last year; the patient shares she lost her husband this past year, and there has been ongoing familiar discord since. She is interested in initiating a low dose medication today- discussed this may be titrated up but will continue for 6-9 months then reevaluate need.   She is followed by Dr. Lorin Mercy for R knee arthritis, getting injections, needs surgery but has been deferring. She has some tenderness of L foot where she had plantar wart removed by Dr. Melford Aase on 10/03/2018. Area appears to be filling in, not injected, no discharge. She is avoiding unnecessary walking or weight bearing.   BMI is Body mass index is 26.13 kg/m., she has not been working on diet and exercise. Wt Readings from Last 3 Encounters:  11/11/18 157 lb (71.2 kg)  10/23/18 163 lb 12.8 oz (74.3 kg)  10/03/18 160 lb 12.8 oz (72.9 kg)    Her blood pressure has been controlled at home, today their BP is BP: 128/78 She does not workout. She denies chest pain, shortness of breath, dizziness.   She is not on cholesterol medication and denies myalgias. Her cholesterol is at goal. The cholesterol last visit was:   Lab  Results  Component Value Date   CHOL 168 08/08/2018   HDL 69 08/08/2018   LDLCALC 87 08/08/2018   TRIG 50 08/08/2018   CHOLHDL 2.4  08/08/2018    She has not been working on diet and exercise for prediabetes, and denies increased appetite, nausea, paresthesia of the feet, polydipsia, polyuria and visual disturbances. Last A1C in the office was:  Lab Results  Component Value Date   HGBA1C 5.7 (H) 08/08/2018   Last GFR: Lab Results  Component Value Date   GFRAA 85 08/08/2018   Patient is on Vitamin D supplement.   Lab Results  Component Value Date   VD25OH 70 08/08/2018      Medication Review: Current Outpatient Medications on File Prior to Visit  Medication Sig Dispense Refill  . aspirin 81 MG tablet Take 81 mg by mouth every evening.     . cholecalciferol (VITAMIN D) 1000 units tablet Take 5,000 Units by mouth daily.     . pantoprazole (PROTONIX) 40 MG tablet Take 1 tablet 2 x /day for Acid Indigestion & Reflux 180 tablet 3  . polyethylene glycol (MIRALAX / GLYCOLAX) packet Take 17 g by mouth daily.     . pseudoephedrine-acetaminophen (TYLENOL SINUS) 30-500 MG TABS tablet Take 1 tablet by mouth every 4 (four) hours as needed.    Marland Kitchen telmisartan (MICARDIS) 80 MG tablet TAKE 1 TABLET BY MOUTH DAILY 90 tablet 1  . vitamin C (ASCORBIC ACID) 500 MG tablet Take 500 mg by mouth daily.     No current facility-administered medications on file prior to visit.     Allergies  Allergen Reactions  . Fosamax [Alendronate Sodium] Itching  . Levofloxacin     Headache, chest pain, muscle aches  . Sulfa Antibiotics Itching  . Tramadol Itching    Current Problems (verified) Patient Active Problem List   Diagnosis Date Noted  . Depression 09/22/2017  . Overweight (BMI 25.0-29.9) 10/29/2015  . Osteoporosis 06/07/2015  . GERD  03/04/2015  . Medication management 01/05/2014  . Essential hypertension 12/02/2013  . Mixed hyperlipidemia 12/02/2013  . Prediabetes 12/02/2013  . Vitamin D deficiency 12/02/2013  . Varicose veins of both lower extremities 09/16/2012    Screening Tests Immunization History  Administered  Date(s) Administered  . DTaP 12/11/2000  . Influenza Split 10/24/2013  . Influenza, High Dose Seasonal PF 09/17/2014, 09/09/2015, 08/21/2016, 09/21/2017, 10/14/2018  . Pneumococcal Conjugate-13 08/21/2016  . Pneumococcal Polysaccharide-23 12/11/2002, 01/08/2018  . Tdap 07/11/2012  . Zoster 08/06/2013   Prior vaccinations: TD or Tdap: 2013  Influenza: 2019 Pneumococcal: 2004, 2019 Prevnar 13: 2017 Shingles/Zostavax: 2014  Last colonoscopy: 2011; next due 2021 Last mammogram: 10/26/2016, has scheduled today Last pap smear/pelvic exam: pap 2014 DONE, pelvic 2017 DEXA: 2015, 2017 progressed R hip T -2.80, failed fosamax, has scheduled today   CT chest 2014 CT head 2018 CT abd 2017 CXR 07/2014 MRI spine 2010 Stress test 07/2014 Cath 07/2014 normal  Names of Other Physician/Practitioners you currently use: 1. [facility]- here for primary care 2. Dr. Wynetta Emery, eye doctor, last visit Aug 2018 3. Dr. Harrington Challenger, dentist, last visit 2019, still going q49m without issues  Patient Care Team: Unk Pinto, MD as PCP - General (Internal Medicine) Celestia Khat, [place] (Optometry) Shela Leff, MD as Consulting Physician (Orthopedic Surgery) Juanita Craver, MD as Consulting Physician (Gastroenterology) Burnell Blanks, MD as Consulting Physician (Cardiology)  SURGICAL HISTORY She  has a past surgical history that includes Cholecystectomy; Tubal  ligation; Ankle surgery; and left heart catheterization with coronary angiogram (N/A, 07/29/2014). FAMILY HISTORY Her family history includes Cancer in her father; Diabetes in her father; Heart disease (age of onset: 25) in her mother; Hyperlipidemia in her sister; Hypertension in her mother and sister. SOCIAL HISTORY She  reports that she has never smoked. She has never used smokeless tobacco. She reports that she does not drink alcohol or use drugs.   MEDICARE WELLNESS  OBJECTIVES: Physical activity: Current Exercise Habits: Home exercise routine, Type of exercise: strength training/weights;stretching, Time (Minutes): 10, Frequency (Times/Week): 3, Weekly Exercise (Minutes/Week): 30, Intensity: Mild, Exercise limited by: orthopedic condition(s) Cardiac risk factors: Cardiac Risk Factors include: advanced age (>65men, >53 women);hypertension;sedentary lifestyle;dyslipidemia Depression/mood screen:   Depression screen [facility] 2/9 11/11/2018  Decreased Interest 1  Down, Depressed, Hopeless 1  PHQ - 2 Score 2  Altered sleeping 0  Tired, decreased energy 1  Change in appetite 1  Feeling bad or failure about yourself  0  Trouble concentrating 1  Moving slowly or fidgety/restless 0  Suicidal thoughts 0  PHQ-9 Score 5  Difficult doing work/chores Somewhat difficult    ADLs:  In your present state of health, do you have any difficulty performing the following activities: 11/11/2018 09/28/2018  Hearing? N N  Vision? N N  Difficulty concentrating or making decisions? N N  Walking or climbing stairs? N N  Dressing or bathing? Y N  Comment R knee arthritis limits stairs -  Doing errands, shopping? N N  Some recent data might be hidden     Cognitive Testing  Alert? Yes  Normal Appearance?Yes  Oriented to person? Yes  Place? Yes   Time? Yes  Recall of three objects?  Yes  Can perform simple calculations? Yes  Displays appropriate judgment?Yes  Can read the correct time from a watch face?Yes  EOL planning: Does Patient Have a Medical Advance Directive?: No Would patient like information on creating a medical advance directive?: No - Patient declined(has papers)  Review of Systems  Constitutional: Negative for malaise/fatigue and weight loss.  HENT: Negative for hearing loss and tinnitus.   Eyes: Negative for blurred vision and double vision.  Respiratory: Negative for cough, sputum production, shortness of breath and wheezing.   Cardiovascular: Negative for  chest pain, palpitations, orthopnea, claudication, leg swelling and PND.  Gastrointestinal: Negative for abdominal pain, blood in stool, constipation, diarrhea, heartburn, melena, nausea and vomiting.  Genitourinary: Negative.   Musculoskeletal: Positive for joint pain (R knee). Negative for falls and myalgias.  Skin: Negative for rash.  Neurological: Negative for dizziness, tingling, sensory change, weakness and headaches.  Endo/Heme/Allergies: Negative for polydipsia.  Psychiatric/Behavioral: Positive for depression. Negative for memory loss, substance abuse and suicidal ideas. The patient is not nervous/anxious and does not have insomnia.   All other systems reviewed and are negative.    Objective:     Today's Vitals   11/11/18 1118  BP: 128/78  Pulse: (!) 110  Temp: 97.9 F (36.6 C)  SpO2: 97%  Weight: 157 lb (71.2 kg)  Height: 5\' 5"  (1.651 m)  PainSc: 7   PainLoc: Knee   Body mass index is 26.13 kg/m.  General appearance: alert, no distress, WD/WN, female HEENT: normocephalic, sclerae anicteric, TMs pearly, nares patent, no discharge or erythema, pharynx normal Oral cavity: MMM, no lesions Neck: supple, no lymphadenopathy, no thyromegaly, no masses Heart: RRR, normal S1, S2, no murmurs Lungs: CTA bilaterally, no wheezes, rhonchi, or rales Abdomen: +bs, soft, non tender, non distended, no  masses, no hepatomegaly, no splenomegaly Musculoskeletal: nontender, no swelling, no obvious deformity Extremities: no edema, no cyanosis, no clubbing Pulses: 2+ symmetric, upper and lower extremities, normal cap refill Neurological: alert, oriented x 3, CN2-12 intact, strength normal upper extremities and lower extremities, sensation normal throughout, DTRs 2+ throughout, no cerebellar signs, gait antalgic Psychiatric: normal affect, behavior normal, pleasant   Medicare Attestation I have personally reviewed: The patient's medical and social history Their use of alcohol, tobacco or  illicit drugs Their current medications and supplements The patient's functional ability including ADLs,fall risks, home safety risks, cognitive, and hearing and visual impairment Diet and physical activities Evidence for depression or mood disorders  The patient's weight, height, BMI, and visual acuity have been recorded in the chart.  I have made referrals, counseling, and provided education to the patient based on review of the above and I have provided the patient with a written personalized care plan for preventive services.     Izora Ribas, NP   11/11/2018

## 2018-11-11 ENCOUNTER — Ambulatory Visit: Payer: Medicare Other | Admitting: Adult Health

## 2018-11-11 ENCOUNTER — Encounter: Payer: Self-pay | Admitting: Adult Health

## 2018-11-11 VITALS — BP 128/78 | HR 110 | Temp 97.9°F | Ht 65.0 in | Wt 157.0 lb

## 2018-11-11 DIAGNOSIS — E559 Vitamin D deficiency, unspecified: Secondary | ICD-10-CM | POA: Diagnosis not present

## 2018-11-11 DIAGNOSIS — Z0001 Encounter for general adult medical examination with abnormal findings: Secondary | ICD-10-CM | POA: Diagnosis not present

## 2018-11-11 DIAGNOSIS — I10 Essential (primary) hypertension: Secondary | ICD-10-CM | POA: Diagnosis not present

## 2018-11-11 DIAGNOSIS — R6889 Other general symptoms and signs: Secondary | ICD-10-CM

## 2018-11-11 DIAGNOSIS — M1711 Unilateral primary osteoarthritis, right knee: Secondary | ICD-10-CM

## 2018-11-11 DIAGNOSIS — E663 Overweight: Secondary | ICD-10-CM

## 2018-11-11 DIAGNOSIS — I8393 Asymptomatic varicose veins of bilateral lower extremities: Secondary | ICD-10-CM

## 2018-11-11 DIAGNOSIS — M810 Age-related osteoporosis without current pathological fracture: Secondary | ICD-10-CM

## 2018-11-11 DIAGNOSIS — K219 Gastro-esophageal reflux disease without esophagitis: Secondary | ICD-10-CM | POA: Diagnosis not present

## 2018-11-11 DIAGNOSIS — E782 Mixed hyperlipidemia: Secondary | ICD-10-CM

## 2018-11-11 DIAGNOSIS — R7303 Prediabetes: Secondary | ICD-10-CM

## 2018-11-11 DIAGNOSIS — Z79899 Other long term (current) drug therapy: Secondary | ICD-10-CM

## 2018-11-11 DIAGNOSIS — Z0000 Encounter for general adult medical examination without abnormal findings: Secondary | ICD-10-CM

## 2018-11-11 DIAGNOSIS — F329 Major depressive disorder, single episode, unspecified: Secondary | ICD-10-CM

## 2018-11-11 DIAGNOSIS — R0609 Other forms of dyspnea: Secondary | ICD-10-CM

## 2018-11-11 LAB — HM DEXA SCAN

## 2018-11-11 LAB — HM MAMMOGRAPHY

## 2018-11-11 MED ORDER — ESCITALOPRAM OXALATE 10 MG PO TABS: 10.0000 mg | ORAL_TABLET | Freq: Every day | ORAL | 2 refills | Status: DC

## 2018-11-11 NOTE — Patient Instructions (Addendum)
Complete advanced directives/living will papers, get notarized and mail [place] a copy for our chart records.   Please schedule a follow up with Dr. Lorin Mercy   Ms. Valenta , Thank you for taking time to come for your Medicare Wellness Visit. I appreciate your ongoing commitment to your health goals. Please review the following plan we discussed and let me know if I can assist you in the future.   These are the goals we discussed: Goals    . Exercise 20 min daily        This is a list of the screening recommended for you and due dates:  Health Maintenance  Topic Date Due  . Mammogram  10/26/2018  . Colon Cancer Screening  08/10/2020  . Tetanus Vaccine  07/11/2022  . Flu Shot  Completed  . DEXA scan (bone density measurement)  Completed  .  Hepatitis C: One time screening is recommended by Center for Disease Control  (CDC) for  adults born from [birth year] through 1966.   Completed  . Pneumonia vaccines  Completed   Know what a healthy weight is for you (roughly BMI <25) and aim to maintain this  Aim for 7+ servings of fruits and vegetables daily  65-80+ fluid ounces of water or unsweet tea for healthy kidneys  Limit to max 1 drink of alcohol per day; avoid smoking/tobacco  Limit animal fats in diet for cholesterol and heart health - choose grass fed whenever available  Avoid highly processed foods, and foods high in saturated/trans fats  Aim for low stress - take time to unwind and care for your mental health  Aim for 150 min of moderate intensity exercise weekly for heart health, and weights twice weekly for bone health  Aim for 7-9 hours of sleep daily

## 2018-11-12 LAB — COMPLETE METABOLIC PANEL WITH GFR
AG RATIO: 1.7 (calc) (ref 1.0–2.5)
ALT: 8 U/L (ref 6–29)
AST: 15 U/L (ref 10–35)
Albumin: 4.5 g/dL (ref 3.6–5.1)
Alkaline phosphatase (APISO): 80 U/L (ref 33–130)
BILIRUBIN TOTAL: 0.7 mg/dL (ref 0.2–1.2)
BUN: 11 mg/dL (ref 7–25)
CHLORIDE: 103 mmol/L (ref 98–110)
CO2: 29 mmol/L (ref 20–32)
Calcium: 10.5 mg/dL — ABNORMAL HIGH (ref 8.6–10.4)
Creat: 0.76 mg/dL (ref 0.60–0.93)
GFR, Est African American: 90 mL/min/{1.73_m2} (ref >=60)
GFR, Est Non African American: 78 mL/min/{1.73_m2} (ref >=60)
GLUCOSE: 101 mg/dL — AB (ref 65–99)
Globulin: 2.7 g/dL (calc) (ref 1.9–3.7)
Potassium: 4.4 mmol/L (ref 3.5–5.3)
SODIUM: 142 mmol/L (ref 135–146)
Total Protein: 7.2 g/dL (ref 6.1–8.1)

## 2018-11-12 LAB — HEMOGLOBIN A1C
EAG (MMOL/L): 6.8 (calc)
Hgb A1c MFr Bld: 5.9 % of total Hgb — ABNORMAL HIGH (ref <5.7)
Mean Plasma Glucose: 123 (calc)

## 2018-11-12 LAB — CBC WITH DIFFERENTIAL/PLATELET
Basophils Absolute: 30 cells/uL (ref 0–200)
Basophils Relative: 1 %
EOS ABS: 39 {cells}/uL (ref 15–500)
Eosinophils Relative: 1.3 %
HCT: 38.2 % (ref 35.0–45.0)
Hemoglobin: 12.7 g/dL (ref 11.7–15.5)
Lymphs Abs: 816 cells/uL — ABNORMAL LOW (ref 850–3900)
MCH: 26.6 pg — AB (ref 27.0–33.0)
MCHC: 33.2 g/dL (ref 32.0–36.0)
MCV: 79.9 fL — AB (ref 80.0–100.0)
MONOS PCT: 6.3 %
MPV: 11.7 fL (ref 7.5–12.5)
NEUTROS PCT: 64.2 %
Neutro Abs: 1926 cells/uL (ref 1500–7800)
PLATELETS: 264 10*3/uL (ref 140–400)
RBC: 4.78 10*6/uL (ref 3.80–5.10)
RDW: 14.2 % (ref 11.0–15.0)
TOTAL LYMPHOCYTE: 27.2 %
WBC mixed population: 189 cells/uL — ABNORMAL LOW (ref 200–950)
WBC: 3 10*3/uL — AB (ref 3.8–10.8)

## 2018-11-12 LAB — LIPID PANEL
CHOLESTEROL: 245 mg/dL — AB (ref <200)
HDL: 71 mg/dL (ref >50)
LDL CHOLESTEROL (CALC): 155 mg/dL — AB
Non-HDL Cholesterol (Calc): 174 mg/dL (calc) — ABNORMAL HIGH (ref <130)
Total CHOL/HDL Ratio: 3.5 (calc) (ref <5.0)
Triglycerides: 83 mg/dL (ref <150)

## 2018-11-12 LAB — MAGNESIUM: MAGNESIUM: 2 mg/dL (ref 1.5–2.5)

## 2018-11-12 LAB — TSH: TSH: 1.32 m[IU]/L (ref 0.40–4.50)

## 2018-11-13 ENCOUNTER — Encounter: Payer: Self-pay | Admitting: *Deleted

## 2018-11-15 ENCOUNTER — Encounter: Payer: Self-pay | Admitting: *Deleted

## 2018-11-20 ENCOUNTER — Ambulatory Visit: Payer: Medicare Other | Admitting: Adult Health

## 2018-11-20 ENCOUNTER — Encounter: Payer: Self-pay | Admitting: Adult Health

## 2018-11-20 VITALS — BP 116/58 | HR 96 | Temp 97.5°F | Ht 65.0 in | Wt 157.0 lb

## 2018-11-20 DIAGNOSIS — R0789 Other chest pain: Secondary | ICD-10-CM | POA: Diagnosis not present

## 2018-11-20 DIAGNOSIS — R059 Cough, unspecified: Secondary | ICD-10-CM

## 2018-11-20 DIAGNOSIS — R05 Cough: Secondary | ICD-10-CM

## 2018-11-20 MED ORDER — TRAZODONE HCL 50 MG PO TABS: ORAL_TABLET | ORAL | 2 refills | Status: DC

## 2018-11-20 NOTE — Progress Notes (Signed)
Assessment and Plan:  Emily Jenkins was seen today for flank pain.  Diagnoses and all orders for this visit:  Tenderness of chest wall Difficult to interview/assess; Most likely costochondritis, exam non-specific though mild localized tenderness suggests musculoskeletal origin. Discussed treatment options, she would like to try OTC analgesics - tylenol, ibuprofen/aleve, salon pas and monitor, agrees with deferring on further workup for now The patient was advised to call immediately if she has any concerning symptoms in the interval. The patient voices understanding of current treatment options and is in agreement with the current care plan.The patient knows to call the clinic with any problems, questions or concerns or go to the ER if she has sudden/severe progression of symptoms such as chest pain, severe fever over night.  Follow up for CXR/labs if not improving.   Intermittent left-sided chest pain/ mild dry Cough Standing CXR order if progressing -     DG Chest 2 View; Future  Further disposition pending results of labs. Discussed med's effects and SE's.   Over 15 minutes of exam, counseling, chart review, and critical decision making was performed.   Future Appointments  Date Time Provider [place]  02/12/2019  2:00 PM Unk Pinto, MD [facility] None  11/14/2019 11:15 AM Liane Comber, NP [facility] None    ------------------------------------------------------------------------------------------------------------------   HPI BP (!) 116/58   Pulse 96   Temp (!) 97.5 F (36.4 C)   Ht 5\' 5"  (1.651 m)   Wt 157 lb (71.2 kg)   SpO2 96%   BMI 26.13 kg/m   72 y.o.female AA, with depression/insomnia, presents for left sided flank tenderness that she noted yesterday. She report this started later in the day, gradually, intermittent aching, localized without shooting/radiating. She reports intermittent symptoms, will ache for 5 min then go away. No notably aggraviting  factors, no known alleviating factors. Not triggered by oral intake, movement, positioning.   She report mild nausea earlier today but spontaneously resolved. Denies other GI symptosm, urinary symptoms. She denies fever, but endorses chills for last few days. She does endorse intermittent cough, no energy.  She has not tried tylenol or other meds for pain.  CXR from 2018 showed possible mild RLL pneumonia. No abnormalities on L.    Past Medical History:  Diagnosis Date  . DJD (degenerative joint disease)   . GERD (gastroesophageal reflux disease)   . Hyperlipidemia   . Hypertension   . Pre-diabetes   . RLS (restless legs syndrome)      Allergies  Allergen Reactions  . Escitalopram Itching  . Fosamax [Alendronate Sodium] Itching  . Levofloxacin     Headache, chest pain, muscle aches  . Sulfa Antibiotics Itching  . Tramadol Itching    Current Outpatient Medications on File Prior to Visit  Medication Sig  . aspirin 81 MG tablet Take 81 mg by mouth every evening.   . cholecalciferol (VITAMIN D) 1000 units tablet Take 5,000 Units by mouth daily.   Marland Kitchen escitalopram (LEXAPRO) 10 MG tablet Take 1 tablet (10 mg total) by mouth daily.  . pantoprazole (PROTONIX) 40 MG tablet Take 1 tablet 2 x /day for Acid Indigestion & Reflux  . polyethylene glycol (MIRALAX / GLYCOLAX) packet Take 17 g by mouth daily.   . pseudoephedrine-acetaminophen (TYLENOL SINUS) 30-500 MG TABS tablet Take 1 tablet by mouth every 4 (four) hours as needed.  Marland Kitchen telmisartan (MICARDIS) 80 MG tablet TAKE 1 TABLET BY MOUTH DAILY  . vitamin C (ASCORBIC ACID) 500 MG tablet Take 500 mg by mouth  daily.   No current facility-administered medications on file prior to visit.     ROS: all negative except above.   Physical Exam:  BP (!) 116/58   Pulse 96   Temp (!) 97.5 F (36.4 C)   Ht 5\' 5"  (1.651 m)   Wt 157 lb (71.2 kg)   SpO2 96%   BMI 26.13 kg/m   General Appearance: Well nourished, in no apparent  distress. Eyes: PERRLA, EOMs, conjunctiva no swelling or erythema Sinuses: No Frontal/maxillary tenderness ENT/Mouth: Ext aud canals clear, TMs without erythema, bulging. No erythema, swelling, or exudate on post pharynx.  Tonsils not swollen or erythematous. Hearing normal.  Neck: Supple, thyroid normal.  Respiratory: Respiratory effort normal, BS equal bilaterally without rales, rhonchi, wheezing or stridor. Questionably diminished in LLL.  Cardio: RRR with no MRGs. Brisk peripheral pulses without edema.  Abdomen: Soft, + BS.  Non tender, no guarding, rebound, hernias, masses. Lymphatics: Non tender without lymphadenopathy.  Musculoskeletal: Full ROM, 5/5 strength, normal gait. Neg straight leg raise. She has poorly localized tenderness to left flank/lateral ribs.  Skin: Warm, dry without rashes, lesions, ecchymosis.  Neuro: Cranial nerves intact. Normal muscle tone, no cerebellar symptoms. Sensation intact.  Psych: Awake and oriented X 3, depressed affect, poor eye contact, Insight and Judgment appropriate.     Izora Ribas, NP 3:49 PM [facility]

## 2018-11-20 NOTE — Patient Instructions (Addendum)
Try tylenol, ibuprofen or aleve for pain  Can pick up salonpas at the pharmacy   Call me back if any new/different symptoms, ER for sudden/severe symptoms   Chest Wall Pain Chest wall pain is pain in or around the bones and muscles of your chest. Sometimes, an injury causes this pain. Sometimes, the cause may not be known. This pain may take several weeks or longer to get better. Follow these instructions at home: Pay attention to any changes in your symptoms. Take these actions to help with your pain:  Rest as told by your health care provider.  Avoid activities that cause pain. These include any activities that use your chest muscles or your abdominal and side muscles to lift heavy items.  If directed, apply ice to the painful area: ? Put ice in a plastic bag. ? Place a towel between your skin and the bag. ? Leave the ice on for 20 minutes, 2-3 times per day.  Take over-the-counter and prescription medicines only as told by your health care provider.  Do not use tobacco products, including cigarettes, chewing tobacco, and e-cigarettes. If you need help quitting, ask your health care provider.  Keep all follow-up visits as told by your health care provider. This is important.  Contact a health care provider if:  You have a fever.  Your chest pain becomes worse.  You have new symptoms. Get help right away if:   You have nausea or vomiting.  You feel sweaty or light-headed.  You have a cough with phlegm (sputum) or you cough up blood.  You develop shortness of breath. This information is not intended to replace advice given to you by your health care provider. Make sure you discuss any questions you have with your health care provider. Document Released: 11/27/2005 Document Revised: 04/06/2016 Document Reviewed: 02/22/2015 Elsevier Interactive Patient Education  2018 [employer].    Trazodone tablets What is this medicine? TRAZODONE (TRAZ oh done) is used to treat  depression. This medicine may be used for other purposes; ask your health care provider or pharmacist if you have questions. COMMON BRAND NAME(S): Desyrel What should I tell my health care provider before I take this medicine? They need to know if you have any of these conditions: -attempted suicide or thinking about it -bipolar disorder -bleeding problems -glaucoma -heart disease, or previous heart attack -irregular heart beat -kidney or liver disease -low levels of sodium in the blood -an unusual or allergic reaction to trazodone, other medicines, foods, dyes or preservatives -pregnant or trying to get pregnant -breast-feeding How should I use this medicine? Take this medicine by mouth with a glass of water. Follow the directions on the prescription label. Take this medicine shortly after a meal or a light snack. Take your medicine at regular intervals. Do not take your medicine more often than directed. Do not stop taking this medicine suddenly except upon the advice of your doctor. Stopping this medicine too quickly may cause serious side effects or your condition may worsen. A special MedGuide will be given to you by the pharmacist with each prescription and refill. Be sure to read this information carefully each time. Talk to your pediatrician regarding the use of this medicine in children. Special care may be needed. Overdosage: If you think you have taken too much of this medicine contact a poison control center or emergency room at once. NOTE: This medicine is only for you. Do not share this medicine with others. What if I miss a  dose? If you miss a dose, take it as soon as you can. If it is almost time for your next dose, take only that dose. Do not take double or extra doses. What may interact with this medicine? Do not take this medicine with any of the following medications: -certain medicines for fungal infections like fluconazole, itraconazole, ketoconazole, posaconazole,  voriconazole -cisapride -dofetilide -dronedarone -linezolid -MAOIs like Carbex, Eldepryl, Marplan, Nardil, and Parnate -mesoridazine -methylene blue (injected into a vein) -pimozide -saquinavir -thioridazine -ziprasidone This medicine may also interact with the following medications: -alcohol -antiviral medicines for HIV or AIDS -aspirin and aspirin-like medicines -barbiturates like phenobarbital -certain medicines for blood pressure, heart disease, irregular heart beat -certain medicines for depression, anxiety, or psychotic disturbances -certain medicines for migraine headache like almotriptan, eletriptan, frovatriptan, naratriptan, rizatriptan, sumatriptan, zolmitriptan -certain medicines for seizures like carbamazepine and phenytoin -certain medicines for sleep -certain medicines that treat or prevent blood clots like dalteparin, enoxaparin, warfarin -digoxin -fentanyl -lithium -NSAIDS, medicines for pain and inflammation, like ibuprofen or naproxen -other medicines that prolong the QT interval (cause an abnormal heart rhythm) -rasagiline -supplements like St. John's wort, kava kava, valerian -tramadol -tryptophan This list may not describe all possible interactions. Give your health care provider a list of all the medicines, herbs, non-prescription drugs, or dietary supplements you use. Also tell them if you smoke, drink alcohol, or use illegal drugs. Some items may interact with your medicine. What should I watch for while using this medicine? Tell your doctor if your symptoms do not get better or if they get worse. Visit your doctor or health care professional for regular checks on your progress. Because it may take several weeks to see the full effects of this medicine, it is important to continue your treatment as prescribed by your doctor. Patients and their families should watch out for new or worsening thoughts of suicide or depression. Also watch out for sudden changes  in feelings such as feeling anxious, agitated, panicky, irritable, hostile, aggressive, impulsive, severely restless, overly excited and hyperactive, or not being able to sleep. If this happens, especially at the beginning of treatment or after a change in dose, call your health care professional. Emily Jenkins may get drowsy or dizzy. Do not drive, use machinery, or do anything that needs mental alertness until you know how this medicine affects you. Do not stand or sit up quickly, especially if you are an older patient. This reduces the risk of dizzy or fainting spells. Alcohol may interfere with the effect of this medicine. Avoid alcoholic drinks. This medicine may cause dry eyes and blurred vision. If you wear contact lenses you may feel some discomfort. Lubricating drops may help. See your eye doctor if the problem does not go away or is severe. Your mouth may get dry. Chewing sugarless gum, sucking hard candy and drinking plenty of water may help. Contact your doctor if the problem does not go away or is severe. What side effects may I notice from receiving this medicine? Side effects that you should report to your doctor or health care professional as soon as possible: -allergic reactions like skin rash, itching or hives, swelling of the face, lips, or tongue -elevated mood, decreased need for sleep, racing thoughts, impulsive behavior -confusion -fast, irregular heartbeat -feeling faint or lightheaded, falls -feeling agitated, angry, or irritable -loss of balance or coordination -painful or prolonged erections -restlessness, pacing, inability to keep still -suicidal thoughts or other mood changes -tremors -trouble sleeping -seizures -unusual bleeding or bruising Side  effects that usually do not require medical attention (report to your doctor or health care professional if they continue or are bothersome): -change in sex drive or performance -change in appetite or  weight -constipation -headache -muscle aches or pains -nausea This list may not describe all possible side effects. Call your doctor for medical advice about side effects. You may report side effects to FDA at 1-800-FDA-1088. Where should I keep my medicine? Keep out of the reach of children. Store at room temperature between 15 and 30 degrees C (59 to 86 degrees F). Protect from light. Keep container tightly closed. Throw away any unused medicine after the expiration date. NOTE: This sheet is a summary. It may not cover all possible information. If you have questions about this medicine, talk to your doctor, pharmacist, or health care provider.  2018 Elsevier/Gold Standard (2016-04-27 16:57:05)

## 2019-01-14 ENCOUNTER — Encounter: Payer: Self-pay | Admitting: Adult Health Nurse Practitioner

## 2019-01-14 ENCOUNTER — Ambulatory Visit: Payer: Medicare Other | Admitting: Adult Health Nurse Practitioner

## 2019-01-14 VITALS — BP 130/78 | HR 123 | Temp 99.1°F | Ht 65.0 in | Wt 155.2 lb

## 2019-01-14 DIAGNOSIS — R0981 Nasal congestion: Secondary | ICD-10-CM

## 2019-01-14 DIAGNOSIS — J029 Acute pharyngitis, unspecified: Secondary | ICD-10-CM | POA: Diagnosis not present

## 2019-01-14 DIAGNOSIS — J0110 Acute frontal sinusitis, unspecified: Secondary | ICD-10-CM

## 2019-01-14 MED ORDER — AMOXICILLIN-POT CLAVULANATE 875-125 MG PO TABS: 1.0000 | ORAL_TABLET | Freq: Two times a day (BID) | ORAL | 0 refills | Status: DC

## 2019-01-14 NOTE — Progress Notes (Signed)
Assessment and Plan:  Siara was seen today for acute visit.  Diagnoses and all orders for this visit:  Acute non-recurrent frontal sinusitis - RX    amoxicillin-clavulanate (AUGMENTIN) 875-125 MG tablet; Take 1 tablet by mouth 2 (two) times daily. 7 days Discussed medication and side effects Monitor for vaginal yeast symptoms  Sore throat OTC lozenges or throat spray Gargle walt salt water, spit out Warm / cold liquids  You may take: buprofen / Advil / Motrin - for pain swelling of throat  600mg  every six hours OR 800mg  every 8 hours If you have this at home each tablet is 200mg .   AND / OR  Tylenol Extra Strength 500mg  - for pain Take 1-2 tablets every 8 hours as needed for fever or pain Do not take more than 4,000mg  to tylenol in 24 hours   Nasal sinus congestion Choose an antihistamine to help dry up nasal drainage.  Zyrtec / Cetirizine Take 10mg  by mouth May cause drowsiness, take nightly Be sure to drink plenty of water If this is not effective, try Xyzal OR Allegra  OR  Xyzal / Levocetirazine  Take 5mg  by mouth May cause drowsiness, take nightly Be sure to drink plenty of water If this is not effective try Allegra OR Zyrtec  OR  Allegra / fexofenadine Take 180mg  by mouth If this is not effective try Zyrtec OR Xyzal   *If you battle with chronic allergies you may need to change the antihistamine you currently use to find most effective.  -[facility] Sinus Rinse / Neti Pot Use warm bottled or distilled water DO NOT use tap water! Use twice a day as needed This will help to sooth irritated sinuses and clear nasal congestion If using nasal sprays, do so after completing this.  Flonase:  One spray in each nostril daily  This will help to open your nasal passages.  Saline Nasal Spray: to sooth your nose You can get this at any pharmacy Use as directed on package This will help to sooth inside of your nose from irritation.  General  Care: Drink plenty of clear fluids Get plenty of rest Wash hands frequently and sanitize shared surfaces, kitchens, bathrooms.   Call or return with new or worsening symptoms as discussed in appointment.  May contact via office phone [phone] or via [place].   Further disposition pending results of labs. Discussed med's effects and SE's.   Over 30 minutes of exam, counseling, chart review, and critical decision making was performed.   Future Appointments  Date Time Provider [place]  02/12/2019  2:00 PM Unk Pinto, MD [facility] None  11/14/2019 11:15 AM Liane Comber, NP [facility] None    ------------------------------------------------------------------------------------------------------------------   HPI 73 y.o.female presents for sinus symptoms that started 5 days ago.  She has a bad headache and lots of pressure behind her eyes and under them.  Her ears are hurting.  Reports that she has some drainage in the back of her throat that is causing it to be sore but it is better today.  Reports that she is coughing that is productive but it is clear.  She has had chills but did not check her temperature.  It is 99 today in the office.  Denies any nausea or vomiting.  Reports she took some Zyrtec and cordicidin.   Past Medical History:  Diagnosis Date  . DJD (degenerative joint disease)   . GERD (gastroesophageal reflux disease)   . Hyperlipidemia   . Hypertension   .  Pre-diabetes   . RLS (restless legs syndrome)      Allergies  Allergen Reactions  . Escitalopram Itching  . Fosamax [Alendronate Sodium] Itching  . Levofloxacin     Headache, chest pain, muscle aches  . Sulfa Antibiotics Itching  . Tramadol Itching    Current Outpatient Medications on File Prior to Visit  Medication Sig  . aspirin 81 MG tablet Take 81 mg by mouth every evening.   . cholecalciferol (VITAMIN D) 1000 units tablet Take 5,000 Units by mouth daily.   . pantoprazole  (PROTONIX) 40 MG tablet Take 1 tablet 2 x /day for Acid Indigestion & Reflux  . polyethylene glycol (MIRALAX / GLYCOLAX) packet Take 17 g by mouth daily.   . pseudoephedrine-acetaminophen (TYLENOL SINUS) 30-500 MG TABS tablet Take 1 tablet by mouth every 4 (four) hours as needed.  Marland Kitchen telmisartan (MICARDIS) 80 MG tablet TAKE 1 TABLET BY MOUTH DAILY  . traZODone (DESYREL) 50 MG tablet 1/2-1 tablet for sleep  . vitamin C (ASCORBIC ACID) 500 MG tablet Take 500 mg by mouth daily.   No current facility-administered medications on file prior to visit.     ROS: Review of Systems  Constitutional: Positive for chills, diaphoresis and malaise/fatigue. Negative for fever and weight loss.  HENT: Positive for congestion, ear pain, sinus pain and sore throat. Negative for ear discharge, hearing loss, nosebleeds and tinnitus.   Eyes: Positive for discharge. Negative for blurred vision, double vision, photophobia, pain and redness.  Respiratory: Positive for cough. Negative for hemoptysis, sputum production, shortness of breath, wheezing and stridor.   Cardiovascular: Negative for chest pain, palpitations, orthopnea, claudication, leg swelling and PND.  Gastrointestinal: Negative for abdominal pain, blood in stool, constipation, diarrhea, heartburn, melena, nausea and vomiting.  Skin: Negative for itching and rash.  Neurological: Positive for headaches. Negative for dizziness.      Physical Exam:  BP 130/78   Pulse (!) 123   Temp 99.1 F (37.3 C)   Ht 5\' 5"  (1.651 m)   Wt 155 lb 3.2 oz (70.4 kg)   SpO2 96%   BMI 25.83 kg/m   General Appearance: Well nourished, in no apparent distress.  Eyes: PERRLA, EOMs, conjunctiva no swelling or erythema Sinuses: PositiveFrontal/maxillary tenderness ENT/Mouth: Ext aud canals clear, TMs without erythema, bulging. Serous noted behind bilateral TM's. No swelling, or exudate on post pharynx, positive erythema.  Tonsils not swollen or erythematous. Hearing normal.  Speech has nasal quality and patient breathing through mouth durring appointment. Neck: Supple, thyroid normal.  Respiratory: Respiratory effort normal, BS equal bilaterally without rales, rhonchi, wheezing or stridor.  Cardio: RRR with no MRGs. Brisk peripheral pulses without edema.  Abdomen: Soft, + BS.  Non tender, no guarding, rebound, hernias, masses. Lymphatics: Non tender without lymphadenopathy.  Musculoskeletal: Full ROM, 5/5 strength, normal gait.  Skin: Warm, dry without rashes, lesions, ecchymosis.  Neuro: Cranial nerves intact. Normal muscle tone, no cerebellar symptoms. Sensation intact.  Psych: Awake and oriented X 3, normal affect, Insight and Judgment appropriate.     Garnet Sierras, NP 3:13 PM [facility]

## 2019-01-14 NOTE — Patient Instructions (Signed)
Today you are being treated for:   Sinusitis     Treat the symptoms!  Please take these medications:      Sinus Congestion:  Norel AD or Tylenol Cold and Sinus Take one tablet by mouth every 4-6 hours as needed This will help to dry out your sinuses, nasal decongestant and acetaminophen.  Flonase: or dail nasal spray to open your nasal cavities One spray in each nostril daily   Saline Nasal Spray: to sooth your nose You can get this at any pharmacy Use as directed on package This will help to sooth inside of your nose from irritation   [facility] Sinus Rinse / Neti Pot Use warm bottled or distilled water DO NOT use tap water! Use twice a day as needed This will help to sooth irritated sinuses and clear nasal congestion If using nasal sprays, do so after completing this.  Zyrtec / Cetirizine Take 10mg  by mouth May cause drowsiness, take nightly Be sure to drink plenty of water If this is not effective, try Xyzal or Allegra  Xyzal / Levocetirazine  Take 5mg  by mouth May cause drowsiness, take nightly Be sure to drink plenty of water If this is not effective try Allegra or Zyrtec  Allegra / fexofenadine Take 180mg  by mouth If this is not effective try Zyrtec or Xyzal    Tylenol Extra Strength 500mg  Take 1-2 tablets every 8 hours as needed for fever or pain Do not take more than 4,000mg  to tylenol in 24 hours   Antibiotic: Augmentin, twice a day for 7 days. Take as directed on your prescription   Sore Throat:  Cepacol lozenges as needed You can get these at any pharmacy  Throat Spray: Use as directed on package as needed You may get this at any pharmacy  Warm / Cold drinks: This will help to sooth your throat  Gargle warm salt water: Gargle then spit water out, do not swallow  Rest your voice: Avoid whispering as well when possible   General Care:  Drink plenty of clear fluids at least 80oz of fluid daily or more.  Get plenty of  rest  Wash hands frequently and sanitize shared surfaces, kitchens, bathrooms.  Call or return with new or worsening symptoms

## 2019-01-16 ENCOUNTER — Encounter: Payer: Self-pay | Admitting: Adult Health Nurse Practitioner

## 2019-01-23 ENCOUNTER — Encounter: Payer: Self-pay | Admitting: Internal Medicine

## 2019-02-03 IMAGING — MR MR LUMBAR SPINE W/O CM
4 of 5 series · 26 of 48 positions shown · non-contrast
Comparison: MRI 08/03/2009

CLINICAL DATA: Chronic midline low back pain with neurogenic
claudication

EXAM:
MRI LUMBAR SPINE WITHOUT CONTRAST
TECHNIQUE: Multiplanar, multisequence MR imaging of the lumbar spine was
performed. No intravenous contrast was administered.

[Series 3: T2 · sagittal · 4.0mm · 0.55mm/px · 6 of 14 slices shown (1 of 2)]
[im 1/14]
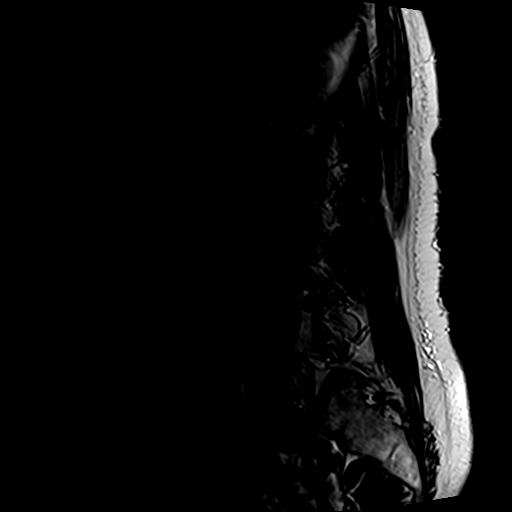
[im 3/14]
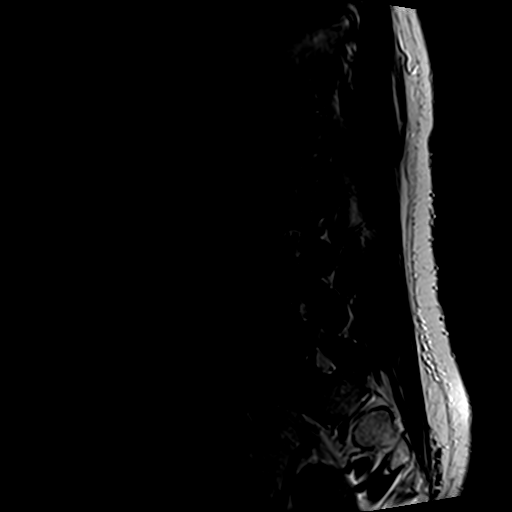
[im 6/14]
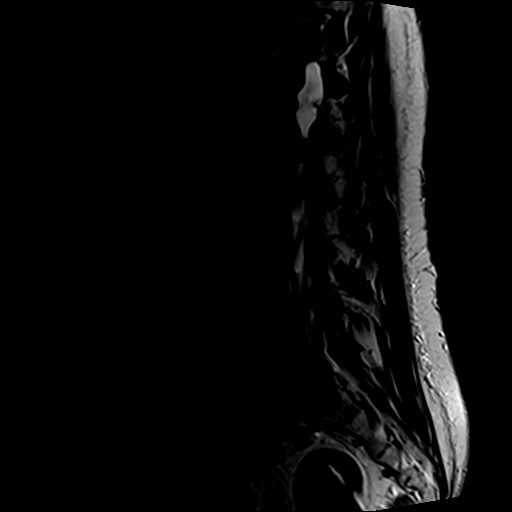
[im 8/14]
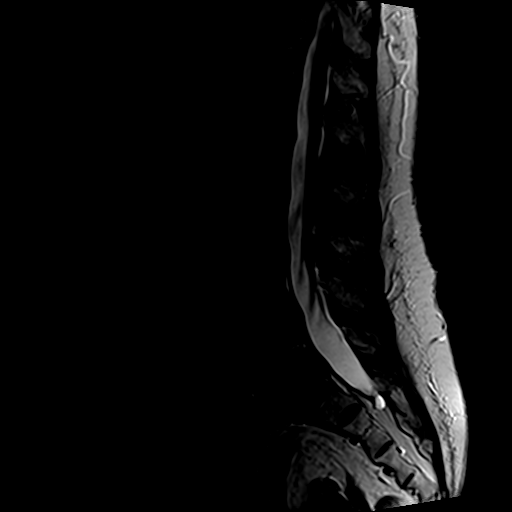
[im 11/14]
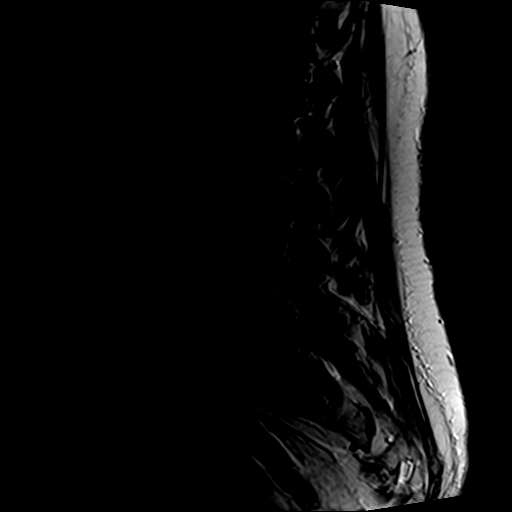
[im 14/14]
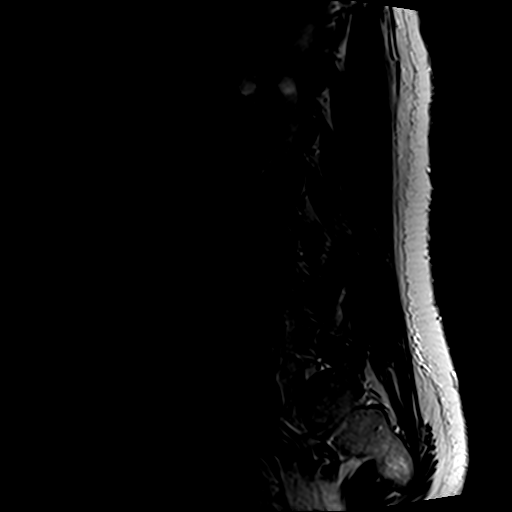

[Series 4: T1 · sagittal · 4.0mm · 0.55mm/px · 6 of 14 slices shown (1 of 2)]
[im 1/14]
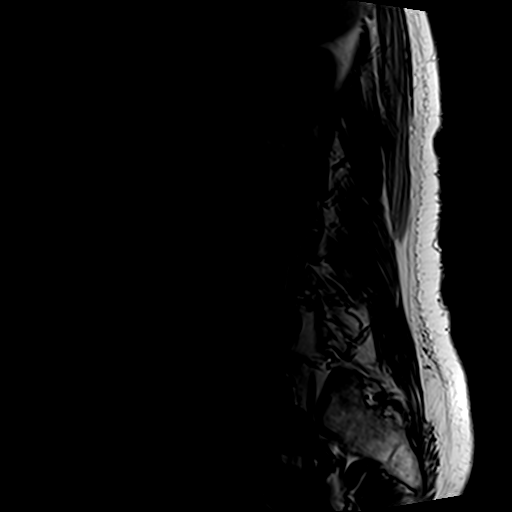
[im 3/14]
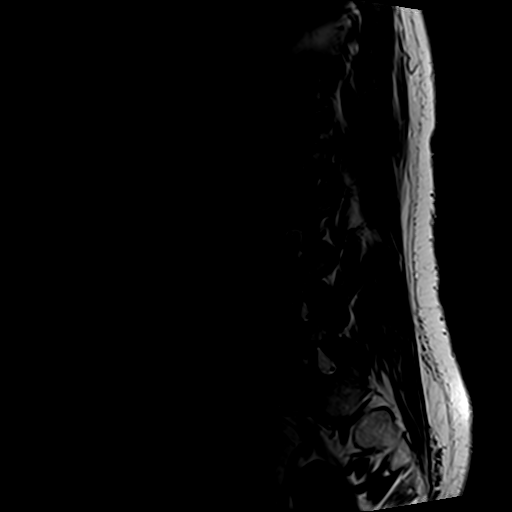
[im 6/14]
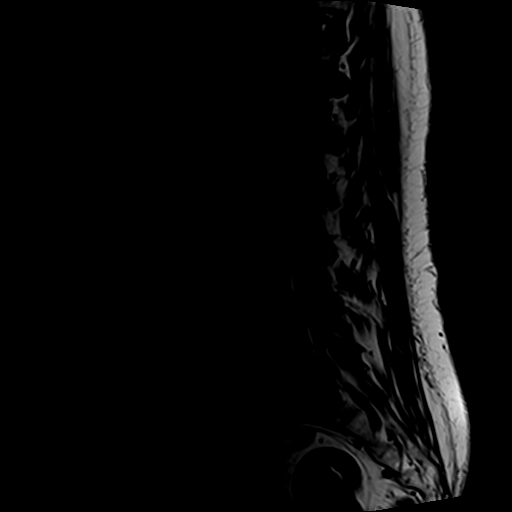
[im 8/14]
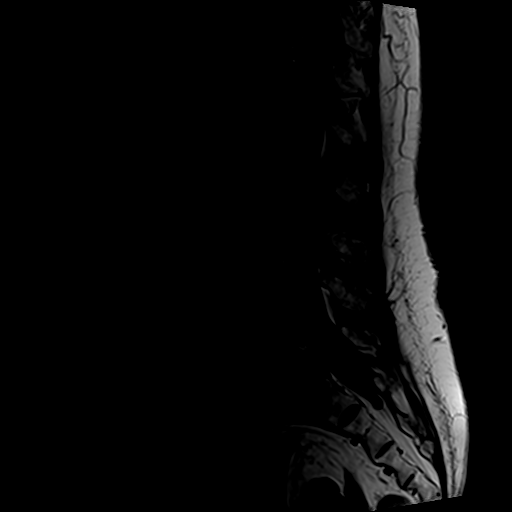
[im 11/14]
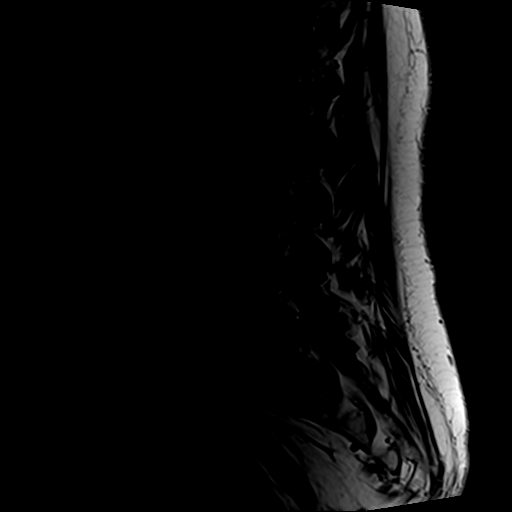
[im 14/14]
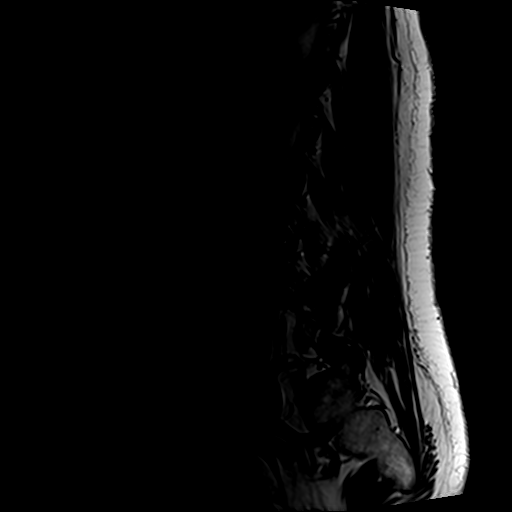

[Series 6: T2 · axial · 4.0mm · 0.70mm/px · z∈[-96,+86]mm · 9 of 34 slices shown (2 of 2)]
[im 1/34]
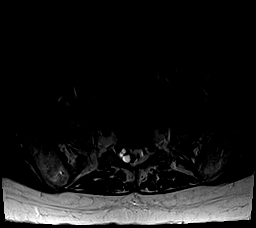
[im 5/34]
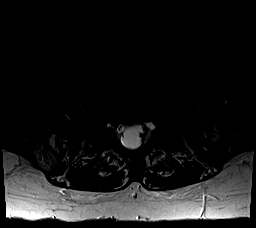
[im 10/34]
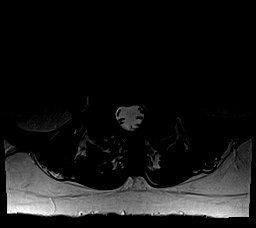
[im 15/34]
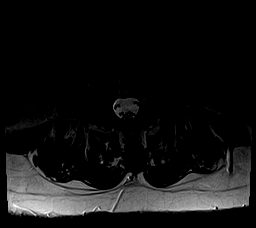
[im 17/34]
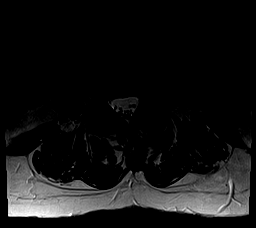
[im 19/34]
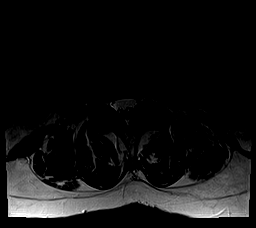
[im 24/34]
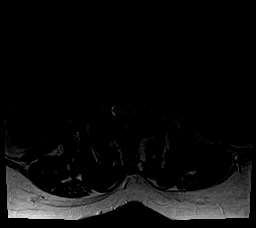
[im 29/34]
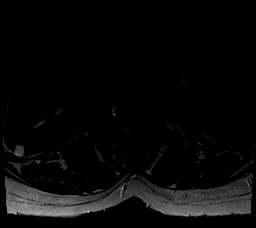
[im 34/34]
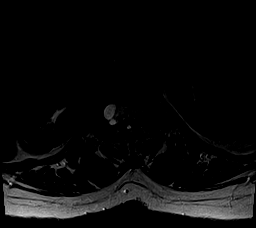

[Series 7: T1 · axial · 4.0mm · 0.35mm/px · z∈[-96,+60]mm · 5 of 34 slices shown (2 of 2)]
[im 1/34]
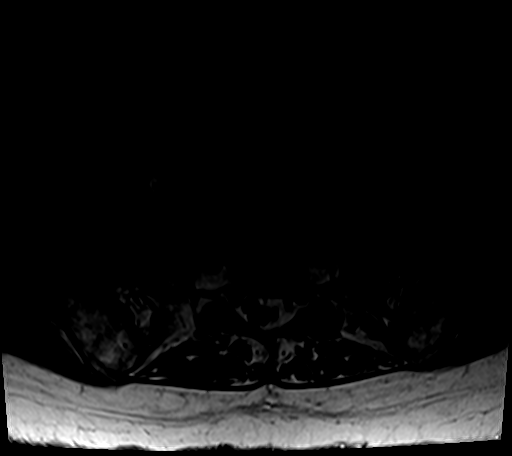
[im 5/34]
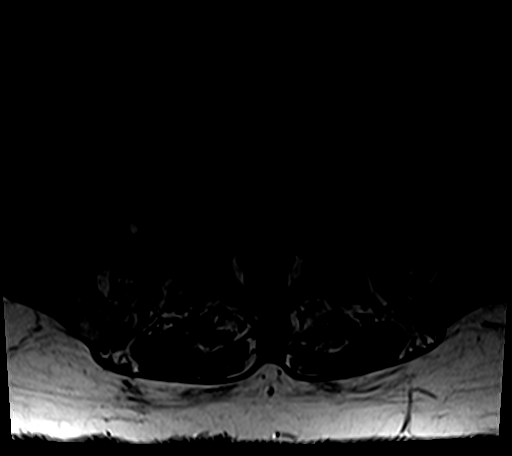
[im 10/34]
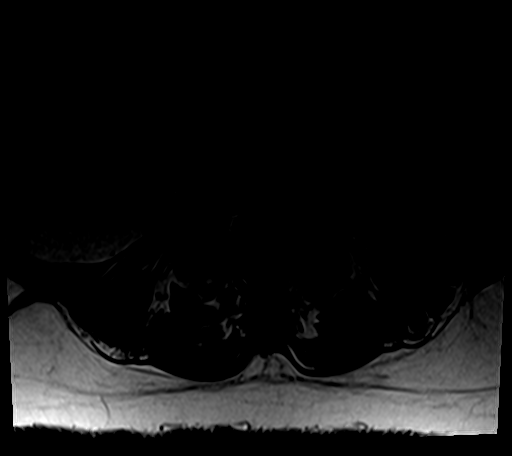
[im 17/34]
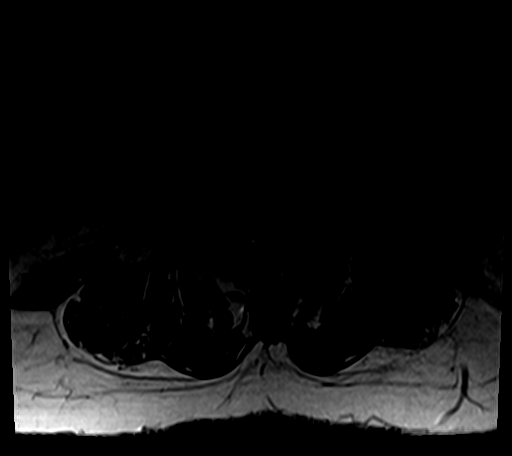
[im 29/34]
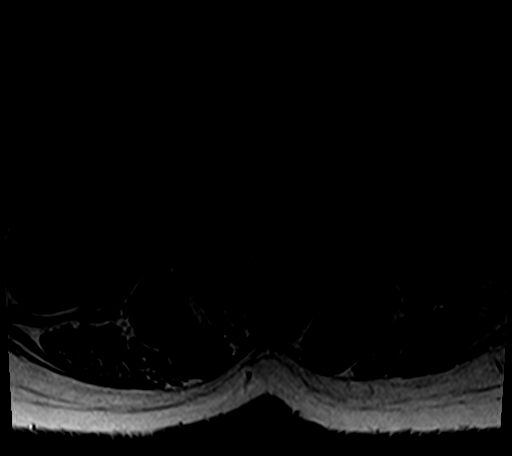

[26 of 48 positions shown; findings below may reference images not displayed]

FINDINGS: Segmentation:  Normal

Alignment:  Normal

Vertebrae:  Normal

Conus medullaris: Extends to the L1-2 level and appears normal.

Paraspinal and other soft tissues: Small renal cysts bilaterally. No
retroperitoneal mass.

Disc levels:

Perineural cyst lateral to the thecal sac on the right extends from
the T12-L1 disc space to the L1-2 disc space and extends into both
neural foramina. This is unchanged from the prior study. There is
enlargement of the right L1-2 neural foramina. This is most likely a
benign perineural cyst.

L1-2:  Mild facet degeneration.  Negative for spinal stenosis

L2-3:  Mild disc and mild facet degeneration without stenosis

L3-4:  Mild disc and mild facet degeneration without stenosis

L4-5: Mild disc and facet degeneration without stenosis. Mild disc
bulging

L5-S1:  Negative
IMPRESSION: Extradural cyst in the spinal canal on the right side from T12-L1
through L1-2 is unchanged and most compatible with a perineural
cyst.

Mild lumbar degenerative change without spinal stenosis or neural
impingement.

## 2019-02-12 ENCOUNTER — Ambulatory Visit: Payer: Medicare Other | Admitting: Internal Medicine

## 2019-02-12 ENCOUNTER — Encounter: Payer: Self-pay | Admitting: Internal Medicine

## 2019-02-12 VITALS — BP 154/78 | HR 88 | Temp 97.6°F | Resp 16 | Ht 65.5 in | Wt 154.2 lb

## 2019-02-12 DIAGNOSIS — I10 Essential (primary) hypertension: Secondary | ICD-10-CM | POA: Diagnosis not present

## 2019-02-12 DIAGNOSIS — E782 Mixed hyperlipidemia: Secondary | ICD-10-CM

## 2019-02-12 DIAGNOSIS — Z0000 Encounter for general adult medical examination without abnormal findings: Secondary | ICD-10-CM | POA: Diagnosis not present

## 2019-02-12 DIAGNOSIS — Z1212 Encounter for screening for malignant neoplasm of rectum: Secondary | ICD-10-CM

## 2019-02-12 DIAGNOSIS — R7309 Other abnormal glucose: Secondary | ICD-10-CM

## 2019-02-12 DIAGNOSIS — Z79899 Other long term (current) drug therapy: Secondary | ICD-10-CM

## 2019-02-12 DIAGNOSIS — Z1211 Encounter for screening for malignant neoplasm of colon: Secondary | ICD-10-CM

## 2019-02-12 DIAGNOSIS — Z136 Encounter for screening for cardiovascular disorders: Secondary | ICD-10-CM | POA: Diagnosis not present

## 2019-02-12 DIAGNOSIS — R7303 Prediabetes: Secondary | ICD-10-CM

## 2019-02-12 DIAGNOSIS — E559 Vitamin D deficiency, unspecified: Secondary | ICD-10-CM

## 2019-02-12 DIAGNOSIS — K219 Gastro-esophageal reflux disease without esophagitis: Secondary | ICD-10-CM

## 2019-02-12 DIAGNOSIS — Z0001 Encounter for general adult medical examination with abnormal findings: Secondary | ICD-10-CM

## 2019-02-12 MED ORDER — ATENOLOL 50 MG PO TABS: ORAL_TABLET | ORAL | 3 refills | Status: DC

## 2019-02-12 NOTE — Patient Instructions (Signed)

## 2019-02-12 NOTE — Progress Notes (Signed)
[facility] Unk Pinto, M.D.     Uvaldo Bristle. Silverio Lay, P.A.-C Liane Comber, [place], [place] [address] Telephone [phone] Telefax [phone] Annual Screening/Preventative Visit & Comprehensive Evaluation &  Examination     This very nice 73 y.o.female presents for a Screening /Preventative Visit & comprehensive evaluation and management of multiple medical co-morbidities.  Patient has been followed for HTN, HLD, Prediabetes  and Vitamin D Deficiency.      HTN predates since early 1990's.  Patient's BP has been controlled at home and patient denies any cardiac symptoms as chest pain, palpitations, shortness of breath, dizziness or ankle swelling. Today's BP is elevated at 154/78.     Patient's hyperlipidemia is controlled with diet and medications. Patient denies myalgias or other medication SE's. Last lipids were not at goal: Lab Results  Component Value Date   CHOL 245 (H) 11/11/2018   HDL 71 11/11/2018   LDLCALC 155 (H) 11/11/2018   TRIG 83 11/11/2018   CHOLHDL 3.5 11/11/2018      Patient has hx/o T2_NIDDM prediabetes predating since      and patient denies reactive hypoglycemic symptoms, visual blurring, diabetic polys or paresthesias. Last A1c was not at goal:  Lab Results  Component Value Date   HGBA1C 5.9 (H) 11/11/2018      Finally, patient has history of Vitamin D Deficiency and last Vitamin D was at goal: Lab Results  Component Value Date   VD25OH 37 08/08/2018   Current Outpatient Medications on File Prior to Visit  Medication Sig  . aspirin 81 MG tablet Take 81 mg by mouth every evening.   . cholecalciferol (VITAMIN D) 1000 units tablet Take 5,000 Units by mouth daily.   . pantoprazole (PROTONIX) 40 MG tablet Take 1 tablet 2 x /day for Acid Indigestion & Reflux  . polyethylene glycol (MIRALAX / GLYCOLAX) packet Take 17 g by mouth daily.   Marland Kitchen telmisartan  (MICARDIS) 80 MG tablet TAKE 1 TABLET BY MOUTH DAILY  . traZODone (DESYREL) 50 MG tablet 1/2-1 tablet for sleep  . vitamin C (ASCORBIC ACID) 500 MG tablet Take 500 mg by mouth daily.   No current facility-administered medications on file prior to visit.    Allergies  Allergen Reactions  . Escitalopram Itching  . Fosamax [Alendronate Sodium] Itching  . Levofloxacin     Headache, chest pain, muscle aches  . Sulfa Antibiotics Itching  . Tramadol Itching   Past Medical History:  Diagnosis Date  . DJD (degenerative joint disease)   . GERD (gastroesophageal reflux disease)   . Hyperlipidemia   . Hypertension   . Pre-diabetes   . RLS (restless legs syndrome)    Health Maintenance  Topic Date Due  . MAMMOGRAM  11/12/2019  . COLONOSCOPY  08/10/2020  . TETANUS/TDAP  07/11/2022  . INFLUENZA VACCINE  Completed  . DEXA SCAN  Completed  . Hepatitis C Screening  Completed  . PNA vac Low Risk Adult  Completed   Immunization History  Administered Date(s) Administered  . DTaP 12/11/2000  . Influenza Split 10/24/2013  . Influenza, High Dose Seasonal PF 09/17/2014, 09/09/2015, 08/21/2016, 09/21/2017, 10/14/2018  . Pneumococcal Conjugate-13 08/21/2016  . Pneumococcal Polysaccharide-23 12/11/2002, 01/08/2018  . Tdap 07/11/2012  . Zoster 08/06/2013   Last Colon -  08/10/2010 - Dr Collene Mares - recc 10 yr f/u in Sept 2021   Last MGM - 11/11/2018  Past Surgical History:  Procedure Laterality Date  . ANKLE  SURGERY    . CHOLECYSTECTOMY    . LEFT HEART CATHETERIZATION WITH CORONARY ANGIOGRAM N/A 07/29/2014   Procedure: LEFT HEART CATHETERIZATION WITH CORONARY ANGIOGRAM;  Surgeon: Burnell Blanks, MD;  Location: [facility] CATH LAB;  Service: Cardiovascular;  Laterality: N/A;  . TUBAL LIGATION     Family History  Problem Relation Age of Onset  . Heart disease Mother 50       stent  . Hypertension Mother   . Cancer Father   . Diabetes Father   . Hyperlipidemia Sister   . Hypertension Sister     Social History   Tobacco Use  . Smoking status: Never Smoker  . Smokeless tobacco: Never Used  . Tobacco comment: SPOUSE SMOKES  Substance Use Topics  . Alcohol use: No    Alcohol/week: 0.0 standard drinks  . Drug use: No    ROS Constitutional: Denies fever, chills, weight loss/gain, headaches, insomnia,  night sweats, and change in appetite. Does c/o fatigue. Eyes: Denies redness, blurred vision, diplopia, discharge, itchy, watery eyes.  ENT: Denies discharge, congestion, post nasal drip, epistaxis, sore throat, earache, hearing loss, dental pain, Tinnitus, Vertigo, Sinus pain, snoring.  Cardio: Denies chest pain, palpitations, irregular heartbeat, syncope, dyspnea, diaphoresis, orthopnea, PND, claudication, edema Respiratory: denies cough, dyspnea, DOE, pleurisy, hoarseness, laryngitis, wheezing.  Gastrointestinal: Denies dysphagia, heartburn, reflux, water brash, pain, cramps, nausea, vomiting, bloating, diarrhea, constipation, hematemesis, melena, hematochezia, jaundice, hemorrhoids Genitourinary: Denies dysuria, frequency, urgency, nocturia, hesitancy, discharge, hematuria, flank pain Breast: Breast lumps, nipple discharge, bleeding.  Musculoskeletal: Denies arthralgia, myalgia, stiffness, Jt. Swelling, pain, limp, and strain/sprain. Denies falls. Skin: Denies puritis, rash, hives, warts, acne, eczema, changing in skin lesion Neuro: No weakness, tremor, incoordination, spasms, paresthesia, pain Psychiatric: Denies confusion, memory loss, sensory loss. Denies Depression. Endocrine: Denies change in weight, skin, hair change, nocturia, and paresthesia, diabetic polys, visual blurring, hyper / hypo glycemic episodes.  Heme/Lymph: No excessive bleeding, bruising, enlarged lymph nodes.  Physical Exam  BP (!) 154/78   Pulse 88   Temp 97.6 F (36.4 C)   Resp 16   Ht 5' 5.5" (1.664 m)   Wt 154 lb 3.2 oz (69.9 kg)   BMI 25.27 kg/m   General Appearance: Ocver  nourished, well  groomed and in no apparent distress.  Eyes: PERRLA, EOMs, conjunctiva no swelling or erythema, normal fundi and vessels. Sinuses: No frontal/maxillary tenderness ENT/Mouth: EACs patent / TMs  nl. Nares clear without erythema, swelling, mucoid exudates. Oral hygiene is good. No erythema, swelling, or exudate. Tongue normal, non-obstructing. Tonsils not swollen or erythematous. Hearing normal.  Neck: Supple, thyroid not palpable. No bruits, nodes or JVD. Respiratory: Respiratory effort normal.  BS equal and clear bilateral without rales, rhonci, wheezing or stridor. Cardio: Heart sounds are normal with regular rate and rhythm and no murmurs, rubs or gallops. Peripheral pulses are normal and equal bilaterally without edema. No aortic or femoral bruits. Chest: symmetric with normal excursions and percussion. Breasts: Symmetric, without lumps, nipple discharge, retractions, or fibrocystic changes.  Abdomen: Flat, soft with bowel sounds active. Nontender, no guarding, rebound, hernias, masses, or organomegaly.  Lymphatics: Non tender without lymphadenopathy.  Musculoskeletal: Full ROM all peripheral extremities, joint stability, 5/5 strength, and normal gait. Skin: Warm and dry without rashes, lesions, cyanosis, clubbing or  ecchymosis.  Neuro: Cranial nerves intact, reflexes equal bilaterally. Normal muscle tone, no cerebellar symptoms. Sensation intact.  Pysch: Alert and oriented X 3, normal affect, Insight and Judgment appropriate.   Assessment and Plan  1. Annual Preventative  Screening Examination  2. Essential hypertension  - EKG 12-Lead - Urinalysis, Routine w reflex microscopic - Microalbumin / creatinine urine ratio - CBC with Differential/Platelet - COMPLETE METABOLIC PANEL WITH GFR - Magnesium - TSH - atenolol (TENORMIN) 50 MG tablet; Take 1 tablet Daily at Suppertime for BP & Palpitations  Dispense: 90 tablet; Refill: 3  3. Hyperlipidemia, mixed  - EKG 12-Lead - Lipid panel -  TSH  4. Abnormal glucose  - EKG 12-Lead - Hemoglobin A1c - Insulin, random  5. Vitamin D deficiency  - VITAMIN D 25 Hydroxy   6. Prediabetes  - EKG 12-Lead - Hemoglobin A1c - Insulin, random  7. Gastroesophageal reflux disease,  - CBC with Differential/Platelet  8. Screening for colorectal cancer  - POC Hemoccult Bld/Stl   9. Screening for ischemic heart disease  - EKG 12-Lead  10. Medication management  - Urinalysis, Routine w reflex microscopic - Microalbumin / creatinine urine ratio - CBC with Differential/Platelet         Patient was counseled in prudent diet to achieve /maintain BMI less than 25 for weight control, BP monitoring, regular exercise and medications. Discussed med's effects and SE's. Screening labs and tests as requested with regular follow-up as recommended. Over 40 minutes of exam, counseling, chart review and high complex critical decision making was performed.

## 2019-02-13 LAB — COMPLETE METABOLIC PANEL WITH GFR
AG Ratio: 1.8 (calc) (ref 1.0–2.5)
ALT: 7 U/L (ref 6–29)
AST: 15 U/L (ref 10–35)
Albumin: 4.6 g/dL (ref 3.6–5.1)
Alkaline phosphatase (APISO): 89 U/L (ref 37–153)
BILIRUBIN TOTAL: 0.5 mg/dL (ref 0.2–1.2)
BUN: 13 mg/dL (ref 7–25)
CO2: 31 mmol/L (ref 20–32)
Calcium: 9.9 mg/dL (ref 8.6–10.4)
Chloride: 105 mmol/L (ref 98–110)
Creat: 0.72 mg/dL (ref 0.60–0.93)
GFR, Est African American: 96 mL/min/{1.73_m2} (ref >=60)
GFR, Est Non African American: 83 mL/min/{1.73_m2} (ref >=60)
Globulin: 2.5 g/dL (calc) (ref 1.9–3.7)
Glucose, Bld: 103 mg/dL — ABNORMAL HIGH (ref 65–99)
Potassium: 3.6 mmol/L (ref 3.5–5.3)
Sodium: 143 mmol/L (ref 135–146)
Total Protein: 7.1 g/dL (ref 6.1–8.1)

## 2019-02-13 LAB — URINALYSIS, ROUTINE W REFLEX MICROSCOPIC
Bilirubin Urine: NEGATIVE
Glucose, UA: NEGATIVE
HGB URINE DIPSTICK: NEGATIVE
Ketones, ur: NEGATIVE
Leukocytes,Ua: NEGATIVE
Nitrite: NEGATIVE
Protein, ur: NEGATIVE
Specific Gravity, Urine: 1.017 (ref 1.001–1.03)
pH: <=5 (ref 5.0–8.0)

## 2019-02-13 LAB — LIPID PANEL
CHOLESTEROL: 236 mg/dL — AB (ref <200)
HDL: 74 mg/dL (ref >=50)
LDL Cholesterol (Calc): 145 mg/dL (calc) — ABNORMAL HIGH
Non-HDL Cholesterol (Calc): 162 mg/dL (calc) — ABNORMAL HIGH (ref <130)
Total CHOL/HDL Ratio: 3.2 (calc) (ref <5.0)
Triglycerides: 73 mg/dL (ref <150)

## 2019-02-13 LAB — CBC WITH DIFFERENTIAL/PLATELET
Absolute Monocytes: 231 cells/uL (ref 200–950)
Basophils Absolute: 40 cells/uL (ref 0–200)
Basophils Relative: 1.2 %
EOS ABS: 69 {cells}/uL (ref 15–500)
Eosinophils Relative: 2.1 %
HEMATOCRIT: 38.5 % (ref 35.0–45.0)
Hemoglobin: 12.9 g/dL (ref 11.7–15.5)
Lymphs Abs: 1007 cells/uL (ref 850–3900)
MCH: 27 pg (ref 27.0–33.0)
MCHC: 33.5 g/dL (ref 32.0–36.0)
MCV: 80.5 fL (ref 80.0–100.0)
MPV: 11.7 fL (ref 7.5–12.5)
Monocytes Relative: 7 %
Neutro Abs: 1954 cells/uL (ref 1500–7800)
Neutrophils Relative %: 59.2 %
Platelets: 283 10*3/uL (ref 140–400)
RBC: 4.78 10*6/uL (ref 3.80–5.10)
RDW: 13.9 % (ref 11.0–15.0)
Total Lymphocyte: 30.5 %
WBC: 3.3 10*3/uL — ABNORMAL LOW (ref 3.8–10.8)

## 2019-02-13 LAB — MICROALBUMIN / CREATININE URINE RATIO
Creatinine, Urine: 106 mg/dL (ref 20–275)
MICROALB/CREAT RATIO: 28 ug/mg{creat} (ref <30)
Microalb, Ur: 3 mg/dL

## 2019-02-13 LAB — HEMOGLOBIN A1C
Hgb A1c MFr Bld: 5.6 % of total Hgb (ref <5.7)
Mean Plasma Glucose: 114 (calc)
eAG (mmol/L): 6.3 (calc)

## 2019-02-13 LAB — INSULIN, RANDOM: Insulin: 6.5 u[IU]/mL

## 2019-02-13 LAB — VITAMIN D 25 HYDROXY (VIT D DEFICIENCY, FRACTURES): VIT D 25 HYDROXY: 72 ng/mL (ref 30–100)

## 2019-02-13 LAB — MAGNESIUM: Magnesium: 2.1 mg/dL (ref 1.5–2.5)

## 2019-02-13 LAB — TSH: TSH: 2.01 mIU/L (ref 0.40–4.50)

## 2019-05-21 NOTE — Progress Notes (Deleted)
MEDICARE ANNUAL WELLNESS VISIT AND FOLLOW UP  Assessment:   Diagnoses and all orders for this visit:  Encounter for Medicare annual wellness exam  Varicose veins of both lower extremities, unspecified whether complicated - weight loss discussed, continue compression stockings and elevation  Essential hypertension Continue medication Monitor blood pressure at home; call if consistently over 130/80 Continue DASH diet.   Reminder to go to the ER if any CP, SOB, nausea, dizziness, severe HA, changes vision/speech, left arm numbness and tingling and jaw pain.  Gastroesophageal reflux disease, esophagitis presence not specified Well managed on current medications Discussed diet, avoiding triggers and other lifestyle changes  Age-related osteoporosis without current pathological fracture - get DEXA 2021, didn't tolerate fosamax, but R hip T improved to osteopenia last check continue Vit D and Ca, weight bearing exercises  Vitamin D deficiency At goal at recent check; continue to recommend supplementation for goal of 70-100 Defer vitamin D level  Abnormal glucose Recent A1Cs at goal Discussed diet/exercise, weight management  Defer A1C; check CMP  Mixed hyperlipidemia Recently elevated; recheck and initiate statin if LDL remains 130+  Continue low cholesterol diet and exercise.  Check lipid panel.   Medication management CBC, CMP/GFR, magnesium   Reactive depression Medications *** Lifestyle discussed: diet/exerise, sleep hygiene, stress management, hydration  Overweight (BMI 25.0-29.9) Long discussion about weight loss, diet, and exercise Recommended diet heavy in fruits and veggies and low in animal meats, cheeses, and dairy products, appropriate calorie intake Discussed appropriate weight for height and initial goal  Follow up at next visit  R knee arthritis Follow up Dr. Berenice Primas, getting injections  Over 40 minutes of exam, counseling, chart review and critical  decision making was performed Future Appointments  Date Time Provider [place]  05/22/2019  2:00 PM Liane Comber, NP [facility] None  08/25/2019  2:30 PM Unk Pinto, MD [facility] None  03/08/2020  2:00 PM Unk Pinto, MD [facility] None    Plan:   During the course of the visit the patient was educated and counseled about appropriate screening and preventive services including:    Pneumococcal vaccine   Prevnar 13  Influenza vaccine  Td vaccine  Screening electrocardiogram  Bone densitometry screening  Colorectal cancer screening  Diabetes screening  Glaucoma screening  Nutrition counseling   Advanced directives: requested   Subjective:  Emily Jenkins is a 73 y.o. female who presents for Medicare Annual Wellness Visit and 3 month follow up.   She is treated for depression ongoing after lost her husband in 2018 with ongoing family discord since; she is on trazodone 50 mg at night ***   She is followed by Dr. Lorin Mercy for R knee arthritis, getting injections, needs surgery but has been deferring. ***  she has a diagnosis of GERD which is currently managed by *** she reports symptoms {ACTION; ARE/ARE [id number]} currently well controlled, and denies breakthrough reflux, burning in chest, hoarseness or cough.     BMI is There is no height or weight on file to calculate BMI., she has not been working on diet and exercise. Wt Readings from Last 3 Encounters:  02/12/19 154 lb 3.2 oz (69.9 kg)  01/14/19 155 lb 3.2 oz (70.4 kg)  11/20/18 157 lb (71.2 kg)    Her blood pressure has been controlled at home, today their BP is   She does not workout. She denies chest pain, shortness of breath, dizziness.   She is not on cholesterol medication *** and denies myalgias. Her cholesterol is not  at goal. The cholesterol last visit was:   Lab Results  Component Value Date   CHOL 236 (H) 02/12/2019   HDL 74 02/12/2019   LDLCALC 145 (H) 02/12/2019    TRIG 73 02/12/2019   CHOLHDL 3.2 02/12/2019    She has not been working on diet and exercise for glucose management, and denies increased appetite, nausea, paresthesia of the feet, polydipsia, polyuria and visual disturbances. Last A1C in the office was:  Lab Results  Component Value Date   HGBA1C 5.6 02/12/2019   Last GFR: Lab Results  Component Value Date   GFRAA 96 02/12/2019   Patient is on Vitamin D supplement.   Lab Results  Component Value Date   VD25OH 72 02/12/2019      Medication Review: Current Outpatient Medications on File Prior to Visit  Medication Sig Dispense Refill  . aspirin 81 MG tablet Take 81 mg by mouth every evening.     Marland Kitchen atenolol (TENORMIN) 50 MG tablet Take 1 tablet Daily at Suppertime for BP & Palpitations 90 tablet 3  . cholecalciferol (VITAMIN D) 1000 units tablet Take 5,000 Units by mouth daily.     . pantoprazole (PROTONIX) 40 MG tablet Take 1 tablet 2 x /day for Acid Indigestion & Reflux 180 tablet 3  . polyethylene glycol (MIRALAX / GLYCOLAX) packet Take 17 g by mouth daily.     Marland Kitchen telmisartan (MICARDIS) 80 MG tablet TAKE 1 TABLET BY MOUTH DAILY 90 tablet 1  . traZODone (DESYREL) 50 MG tablet 1/2-1 tablet for sleep 30 tablet 2  . vitamin C (ASCORBIC ACID) 500 MG tablet Take 500 mg by mouth daily.     No current facility-administered medications on file prior to visit.     Allergies  Allergen Reactions  . Escitalopram Itching  . Fosamax [Alendronate Sodium] Itching  . Levofloxacin     Headache, chest pain, muscle aches  . Sulfa Antibiotics Itching  . Tramadol Itching    Current Problems (verified) Patient Active Problem List   Diagnosis Date Noted  . Arthritis of right knee 11/11/2018  . Depression 09/22/2017  . Overweight (BMI 25.0-29.9) 10/29/2015  . Osteoporosis 06/07/2015  . GERD  03/04/2015  . Medication management 01/05/2014  . Essential hypertension 12/02/2013  . Mixed hyperlipidemia 12/02/2013  . Other abnormal glucose  (prediabetes) 12/02/2013  . Vitamin D deficiency 12/02/2013  . Varicose veins of both lower extremities 09/16/2012    Screening Tests Immunization History  Administered Date(s) Administered  . DTaP 12/11/2000  . Influenza Split 10/24/2013  . Influenza, High Dose Seasonal PF 09/17/2014, 09/09/2015, 08/21/2016, 09/21/2017, 10/14/2018  . Pneumococcal Conjugate-13 08/21/2016  . Pneumococcal Polysaccharide-23 12/11/2002, 01/08/2018  . Tdap 07/11/2012  . Zoster 08/06/2013   Prior vaccinations: TD or Tdap: 2013  Influenza: 2019 Pneumococcal: 2004, 2019 Prevnar 13: 2017 Shingles/Zostavax: 2014  Last colonoscopy: 2011; next due 2021 Last mammogram: 11/2018 Last pap smear/pelvic exam: pap 2014 DONE, pelvic 2017 DEXA: 2017, 2019 - R hip T improved from -2.8 to -2.5, fosamax intolerance  CT chest 2014 CT head 2018 CT abd 2017 CXR 07/2014 MRI spine 2010 Stress test 07/2014 Cath 07/2014 normal  Names of Other Physician/Practitioners you currently use: 1. [facility]- here for primary care 2. Dr. Wynetta Emery, eye doctor, last visit Aug 2018 3. Dr. Harrington Challenger, dentist, last visit 2019, still going q2m without issues  Patient Care Team: Unk Pinto, MD as PCP - General (Internal Medicine) Celestia Khat, [place] (Optometry) Shela Leff,  MD as Consulting Physician (Orthopedic Surgery) Juanita Craver, MD as Consulting Physician (Gastroenterology) Burnell Blanks, MD as Consulting Physician (Cardiology)  SURGICAL HISTORY She  has a past surgical history that includes Cholecystectomy; Tubal ligation; Ankle surgery; and left heart catheterization with coronary angiogram (N/A, 07/29/2014). FAMILY HISTORY Her family history includes Cancer in her father; Diabetes in her father; Heart disease (age of onset: 92) in her mother; Hyperlipidemia in her sister; Hypertension in her mother and sister. SOCIAL HISTORY She  reports that she  has never smoked. She has never used smokeless tobacco. She reports that she does not drink alcohol or use drugs.   MEDICARE WELLNESS OBJECTIVES: Physical activity:   Cardiac risk factors:   Depression/mood screen:   Depression screen [facility] 2/9 11/11/2018  Decreased Interest 1  Down, Depressed, Hopeless 1  PHQ - 2 Score 2  Altered sleeping 0  Tired, decreased energy 1  Change in appetite 1  Feeling bad or failure about yourself  0  Trouble concentrating 1  Moving slowly or fidgety/restless 0  Suicidal thoughts 0  PHQ-9 Score 5  Difficult doing work/chores Somewhat difficult    ADLs:  In your present state of health, do you have any difficulty performing the following activities: 11/11/2018 09/28/2018  Hearing? N N  Vision? N N  Difficulty concentrating or making decisions? N N  Walking or climbing stairs? N N  Dressing or bathing? Y N  Comment R knee arthritis limits stairs -  Doing errands, shopping? N N  Some recent data might be hidden     Cognitive Testing  Alert? Yes  Normal Appearance?Yes  Oriented to person? Yes  Place? Yes   Time? Yes  Recall of three objects?  Yes  Can perform simple calculations? Yes  Displays appropriate judgment?Yes  Can read the correct time from a watch face?Yes  EOL planning:    Review of Systems  Constitutional: Negative for malaise/fatigue and weight loss.  HENT: Negative for hearing loss and tinnitus.   Eyes: Negative for blurred vision and double vision.  Respiratory: Negative for cough, sputum production, shortness of breath and wheezing.   Cardiovascular: Negative for chest pain, palpitations, orthopnea, claudication, leg swelling and PND.  Gastrointestinal: Negative for abdominal pain, blood in stool, constipation, diarrhea, heartburn, melena, nausea and vomiting.  Genitourinary: Negative.   Musculoskeletal: Positive for joint pain (R knee). Negative for falls and myalgias.  Skin: Negative for rash.  Neurological: Negative for  dizziness, tingling, sensory change, weakness and headaches.  Endo/Heme/Allergies: Negative for polydipsia.  Psychiatric/Behavioral: Positive for depression. Negative for memory loss, substance abuse and suicidal ideas. The patient is not nervous/anxious and does not have insomnia.   All other systems reviewed and are negative.    Objective:     There were no vitals filed for this visit. There is no height or weight on file to calculate BMI.  General appearance: alert, no distress, WD/WN, female HEENT: normocephalic, sclerae anicteric, TMs pearly, nares patent, no discharge or erythema, pharynx normal Oral cavity: MMM, no lesions Neck: supple, no lymphadenopathy, no thyromegaly, no masses Heart: RRR, normal S1, S2, no murmurs Lungs: CTA bilaterally, no wheezes, rhonchi, or rales Abdomen: +bs, soft, non tender, non distended, no masses, no hepatomegaly, no splenomegaly Musculoskeletal: nontender, no swelling, no obvious deformity Extremities: no edema, no cyanosis, no clubbing Pulses: 2+ symmetric, upper and lower extremities, normal cap refill Neurological: alert, oriented x 3, CN2-12 intact, strength normal upper extremities and lower extremities, sensation normal throughout, DTRs 2+ throughout, no  cerebellar signs, gait antalgic Psychiatric: normal affect, behavior normal, pleasant   Medicare Attestation I have personally reviewed: The patient's medical and social history Their use of alcohol, tobacco or illicit drugs Their current medications and supplements The patient's functional ability including ADLs,fall risks, home safety risks, cognitive, and hearing and visual impairment Diet and physical activities Evidence for depression or mood disorders  The patient's weight, height, BMI, and visual acuity have been recorded in the chart.  I have made referrals, counseling, and provided education to the patient based on review of the above and I have provided the patient with a  written personalized care plan for preventive services.     Izora Ribas, NP   05/21/2019

## 2019-05-22 ENCOUNTER — Ambulatory Visit: Payer: Self-pay | Admitting: Adult Health

## 2019-05-26 NOTE — Progress Notes (Signed)
MEDICARE ANNUAL WELLNESS VISIT AND FOLLOW UP  Assessment:   Diagnoses and all orders for this visit:  Encounter for Medicare annual wellness exam  Varicose veins of both lower extremities, unspecified whether complicated - weight loss discussed, continue compression stockings and elevation  Essential hypertension Continue medication; single elevation today, typically well controlled and home values at goal - defer med adjustment  Monitor blood pressure at home; call if consistently over 130/80 Continue DASH diet.   Reminder to go to the ER if any CP, SOB, nausea, dizziness, severe HA, changes vision/speech, left arm numbness and tingling and jaw pain.  Gastroesophageal reflux disease, esophagitis presence not specified Well managed on current medications Discussed diet, avoiding triggers and other lifestyle changes  Age-related osteoporosis without current pathological fracture - get DEXA 2021, didn't tolerate fosamax, but R hip T improved to osteopenia last check continue Vit D and Ca, weight bearing exercises reviewed Can refer to endocrine if worse next check   Vitamin D deficiency At goal at recent check; continue to recommend supplementation for goal of 70-100 Defer vitamin D level  Abnormal glucose Recent A1Cs at goal Discussed diet/exercise, weight management  Defer A1C; check CMP  Mixed hyperlipidemia Recently elevated; recheck and initiate statin if LDL remains 130+  Continue low cholesterol diet and exercise.  Check lipid panel.   Medication management CBC, CMP/GFR, magnesium   Overweight (BMI 25.0-29.9) Long discussion about weight loss, diet, and exercise Recommended diet heavy in fruits and veggies and low in animal meats, cheeses, and dairy products, appropriate calorie intake Discussed appropriate weight for height and initial goal  Follow up at next visit  R knee arthritis Follow up Dr. Berenice Primas, getting injections  Over 40 minutes of exam,  counseling, chart review and critical decision making was performed Future Appointments  Date Time Provider [place]  09/04/2019 11:15 AM Unk Pinto, MD [facility] None  03/08/2020  2:00 PM Unk Pinto, MD [facility] None    Plan:   During the course of the visit the patient was educated and counseled about appropriate screening and preventive services including:    Pneumococcal vaccine   Prevnar 13  Influenza vaccine  Td vaccine  Screening electrocardiogram  Bone densitometry screening  Colorectal cancer screening  Diabetes screening  Glaucoma screening  Nutrition counseling   Advanced directives: requested   Subjective:  Emily Jenkins is a 73 y.o. female who presents for Medicare Annual Wellness Visit and 3 month follow up.   She is followed by Dr. Lorin Mercy for R knee arthritis, getting injections, needs surgery but has been deferring.  she has a diagnosis of GERD which is currently managed by protonix 40 mg daily she reports symptoms is currently well controlled, and denies breakthrough reflux, burning in chest, hoarseness or cough.    BMI is Body mass index is 25.63 kg/m., she has not been working on diet and exercise. Wt Readings from Last 3 Encounters:  05/28/19 156 lb 6.4 oz (70.9 kg)  02/12/19 154 lb 3.2 oz (69.9 kg)  01/14/19 155 lb 3.2 oz (70.4 kg)    Her blood pressure has been controlled at home (130/80 yesterday), today their BP is BP: (!) 156/80, similar by manual recheck by provider; historically has been at goal consistently  She does not workout. She denies chest pain, shortness of breath, dizziness.   She is not on cholesterol medication and denies myalgias. Her cholesterol is not at goal. The cholesterol last visit was:   Lab Results  Component Value Date  CHOL 236 (H) 02/12/2019   HDL 74 02/12/2019   LDLCALC 145 (H) 02/12/2019   TRIG 73 02/12/2019   CHOLHDL 3.2 02/12/2019    She has not been working on diet and  exercise for glucose management, and denies increased appetite, nausea, paresthesia of the feet, polydipsia, polyuria and visual disturbances. Last A1C in the office was:  Lab Results  Component Value Date   HGBA1C 5.6 02/12/2019   Last GFR: Lab Results  Component Value Date   GFRAA 96 02/12/2019   Patient is on Vitamin D supplement.   Lab Results  Component Value Date   VD25OH 72 02/12/2019      Medication Review: Current Outpatient Medications on File Prior to Visit  Medication Sig Dispense Refill  . aspirin 81 MG tablet Take 81 mg by mouth every evening.     Marland Kitchen atenolol (TENORMIN) 50 MG tablet Take 1 tablet Daily at Suppertime for BP & Palpitations 90 tablet 3  . cholecalciferol (VITAMIN D) 1000 units tablet Take 5,000 Units by mouth daily.     . pantoprazole (PROTONIX) 40 MG tablet Take 1 tablet 2 x /day for Acid Indigestion & Reflux 180 tablet 3  . polyethylene glycol (MIRALAX / GLYCOLAX) packet Take 17 g by mouth daily.     Marland Kitchen telmisartan (MICARDIS) 80 MG tablet TAKE 1 TABLET BY MOUTH DAILY 90 tablet 1  . vitamin C (ASCORBIC ACID) 500 MG tablet Take 500 mg by mouth daily.     No current facility-administered medications on file prior to visit.     Allergies  Allergen Reactions  . Escitalopram Itching  . Fosamax [Alendronate Sodium] Itching  . Levofloxacin     Headache, chest pain, muscle aches  . Sulfa Antibiotics Itching  . Tramadol Itching    Current Problems (verified) Patient Active Problem List   Diagnosis Date Noted  . Arthritis of right knee 11/11/2018  . Overweight (BMI 25.0-29.9) 10/29/2015  . Osteoporosis 06/07/2015  . GERD  03/04/2015  . Medication management 01/05/2014  . Essential hypertension 12/02/2013  . Mixed hyperlipidemia 12/02/2013  . Other abnormal glucose (prediabetes) 12/02/2013  . Vitamin D deficiency 12/02/2013  . Varicose veins of both lower extremities 09/16/2012    Screening Tests Immunization History  Administered Date(s)  Administered  . DTaP 12/11/2000  . Influenza Split 10/24/2013  . Influenza, High Dose Seasonal PF 09/17/2014, 09/09/2015, 08/21/2016, 09/21/2017, 10/14/2018  . Pneumococcal Conjugate-13 08/21/2016  . Pneumococcal Polysaccharide-23 12/11/2002, 01/08/2018  . Tdap 07/11/2012  . Zoster 08/06/2013   Prior vaccinations: TD or Tdap: 2013  Influenza: 2019 Pneumococcal: 2004, 2019 Prevnar 13: 2017 Shingles/Zostavax: 2014  Last colonoscopy: 2011; next due 2021 Last mammogram: 11/2018 Last pap smear/pelvic exam: pap 2014 DONE, pelvic 2017 DEXA: 2017, 2019 - R hip T improved from -2.8 to -2.5, fosamax intolerance (itching)  CT chest 2014 CT head 2018 CT abd 2017 CXR 07/2014 MRI spine 2010 Stress test 07/2014 Cath 07/2014 normal  Names of Other Physician/Practitioners you currently use: 1. [facility]- here for primary care 2. [facility] in [place], eye doctor, last visit 2020 3. Dr. Harrington Challenger, dentist, last visit 2020, still going q1m without issues  Patient Care Team: Unk Pinto, MD as PCP - General (Internal Medicine) Celestia Khat, [place] (Optometry) Shela Leff, MD as Consulting Physician (Orthopedic Surgery) Juanita Craver, MD as Consulting Physician (Gastroenterology) Burnell Blanks, MD as Consulting Physician (Cardiology)  SURGICAL HISTORY She  has a past surgical history that  includes Cholecystectomy; Tubal ligation; Ankle surgery; and left heart catheterization with coronary angiogram (N/A, 07/29/2014). FAMILY HISTORY Her family history includes Cancer in her father; Diabetes in her father; Heart disease (age of onset: 32) in her mother; Hyperlipidemia in her sister; Hypertension in her mother and sister. SOCIAL HISTORY She  reports that she has never smoked. She has never used smokeless tobacco. She reports that she does not drink alcohol or use drugs.   MEDICARE WELLNESS OBJECTIVES: Physical  activity: Current Exercise Habits: Home exercise routine, Time (Minutes): 20, Frequency (Times/Week): 2, Weekly Exercise (Minutes/Week): 40, Intensity: Mild, Exercise limited by: orthopedic condition(s) Cardiac risk factors: Cardiac Risk Factors include: advanced age (>15men, >53 women);dyslipidemia;hypertension Depression/mood screen:   Depression screen [facility] 2/9 05/28/2019  Decreased Interest 0  Down, Depressed, Hopeless 0  PHQ - 2 Score 0  Altered sleeping -  Tired, decreased energy -  Change in appetite -  Feeling bad or failure about yourself  -  Trouble concentrating -  Moving slowly or fidgety/restless -  Suicidal thoughts -  PHQ-9 Score -  Difficult doing work/chores -    ADLs:  In your present state of health, do you have any difficulty performing the following activities: 05/28/2019 11/11/2018  Hearing? N N  Vision? N N  Difficulty concentrating or making decisions? N N  Walking or climbing stairs? N N  Dressing or bathing? N Y  Comment - R knee arthritis limits stairs  Doing errands, shopping? N N  Some recent data might be hidden     Cognitive Testing  Alert? Yes  Normal Appearance?Yes  Oriented to person? Yes  Place? Yes   Time? Yes  Recall of three objects?  Yes  Can perform simple calculations? Yes  Displays appropriate judgment?Yes  Can read the correct time from a watch face?Yes  EOL planning: Does Patient Have a Medical Advance Directive?: No Would patient like information on creating a medical advance directive?: No - Patient declined  Review of Systems  Constitutional: Negative for malaise/fatigue and weight loss.  HENT: Negative for hearing loss and tinnitus.   Eyes: Negative for blurred vision and double vision.  Respiratory: Negative for cough, sputum production, shortness of breath and wheezing.   Cardiovascular: Negative for chest pain, palpitations, orthopnea, claudication, leg swelling and PND.  Gastrointestinal: Negative for abdominal pain, blood  in stool, constipation, diarrhea, heartburn, melena, nausea and vomiting.  Genitourinary: Negative.   Musculoskeletal: Positive for joint pain (R knee). Negative for falls and myalgias.  Skin: Negative for rash.  Neurological: Negative for dizziness, tingling, sensory change, weakness and headaches.  Endo/Heme/Allergies: Negative for polydipsia.  Psychiatric/Behavioral: Negative for depression, memory loss, substance abuse and suicidal ideas. The patient is not nervous/anxious and does not have insomnia (intermittent).   All other systems reviewed and are negative.    Objective:     Today's Vitals   05/28/19 1156  BP: (!) 156/80  Pulse: 85  Temp: (!) 97.5 F (36.4 C)  SpO2: 99%  Weight: 156 lb 6.4 oz (70.9 kg)  Height: 5' 5.5" (1.664 m)   Body mass index is 25.63 kg/m.  General appearance: alert, no distress, WD/WN, female HEENT: normocephalic, sclerae anicteric, TMs pearly, nares patent, no discharge or erythema, pharynx normal Oral cavity: MMM, no lesions Neck: supple, no lymphadenopathy, no thyromegaly, no masses Heart: RRR, normal S1, S2, no murmurs Lungs: CTA bilaterally, no wheezes, rhonchi, or rales Abdomen: +bs, soft, non tender, non distended, no masses, no hepatomegaly, no splenomegaly Musculoskeletal: nontender, no swelling, no  obvious deformity Extremities: no edema, no cyanosis, no clubbing Pulses: 2+ symmetric, upper and lower extremities, normal cap refill Neurological: alert, oriented x 3, CN2-12 intact, strength normal upper extremities and lower extremities, sensation normal throughout, DTRs 2+ throughout, no cerebellar signs, gait antalgic Psychiatric: normal affect, behavior normal, pleasant   Medicare Attestation I have personally reviewed: The patient's medical and social history Their use of alcohol, tobacco or illicit drugs Their current medications and supplements The patient's functional ability including ADLs,fall risks, home safety risks,  cognitive, and hearing and visual impairment Diet and physical activities Evidence for depression or mood disorders  The patient's weight, height, BMI, and visual acuity have been recorded in the chart.  I have made referrals, counseling, and provided education to the patient based on review of the above and I have provided the patient with a written personalized care plan for preventive services.     Izora Ribas, NP   05/28/2019

## 2019-05-28 ENCOUNTER — Other Ambulatory Visit: Payer: Self-pay

## 2019-05-28 ENCOUNTER — Encounter: Payer: Self-pay | Admitting: Adult Health

## 2019-05-28 ENCOUNTER — Ambulatory Visit: Payer: Medicare Other | Admitting: Adult Health

## 2019-05-28 VITALS — BP 156/80 | HR 85 | Temp 97.5°F | Ht 65.5 in | Wt 156.4 lb

## 2019-05-28 DIAGNOSIS — M1711 Unilateral primary osteoarthritis, right knee: Secondary | ICD-10-CM | POA: Diagnosis not present

## 2019-05-28 DIAGNOSIS — I8393 Asymptomatic varicose veins of bilateral lower extremities: Secondary | ICD-10-CM

## 2019-05-28 DIAGNOSIS — R6889 Other general symptoms and signs: Secondary | ICD-10-CM

## 2019-05-28 DIAGNOSIS — E663 Overweight: Secondary | ICD-10-CM

## 2019-05-28 DIAGNOSIS — K219 Gastro-esophageal reflux disease without esophagitis: Secondary | ICD-10-CM | POA: Diagnosis not present

## 2019-05-28 DIAGNOSIS — Z0001 Encounter for general adult medical examination with abnormal findings: Secondary | ICD-10-CM

## 2019-05-28 DIAGNOSIS — I10 Essential (primary) hypertension: Secondary | ICD-10-CM | POA: Diagnosis not present

## 2019-05-28 DIAGNOSIS — Z0000 Encounter for general adult medical examination without abnormal findings: Secondary | ICD-10-CM

## 2019-05-28 DIAGNOSIS — F329 Major depressive disorder, single episode, unspecified: Secondary | ICD-10-CM

## 2019-05-28 DIAGNOSIS — E782 Mixed hyperlipidemia: Secondary | ICD-10-CM

## 2019-05-28 DIAGNOSIS — Z79899 Other long term (current) drug therapy: Secondary | ICD-10-CM

## 2019-05-28 DIAGNOSIS — M810 Age-related osteoporosis without current pathological fracture: Secondary | ICD-10-CM

## 2019-05-28 DIAGNOSIS — R7309 Other abnormal glucose: Secondary | ICD-10-CM

## 2019-05-28 DIAGNOSIS — E559 Vitamin D deficiency, unspecified: Secondary | ICD-10-CM

## 2019-05-28 NOTE — Patient Instructions (Addendum)
Emily Jenkins , Thank you for taking time to come for your Medicare Wellness Visit. I appreciate your ongoing commitment to your health goals. Please review the following plan we discussed and let me know if I can assist you in the future.   These are the goals we discussed: Goals    . Blood Pressure < 130/80    . Exercise 20 min daily        This is a list of the screening recommended for you and due dates:  Health Maintenance  Topic Date Due  . Flu Shot  07/12/2019  . Mammogram  11/12/2019  . Colon Cancer Screening  08/10/2020  . Tetanus Vaccine  07/11/2022  . DEXA scan (bone density measurement)  Completed  .  Hepatitis C: One time screening is recommended by Center for Disease Control  (CDC) for  adults born from [birth year] through 1966.   Completed  . Pneumonia vaccines  Completed     Pick up some compression hose - can get 3 pair for $10 or so on [employer] larger size or light compression to begin with, light compression   Try lowering home temp to 68 degrees at night  Can try melatonin 5mg -15 mg at night for sleep, can also do benadryl 25-50mg  at night for sleep.  If this does not help we can try prescription medication.  Also here is some information about good sleep hygiene.   Insomnia Insomnia is frequent trouble falling and/or staying asleep. Insomnia can be a long term problem or a short term problem. Both are common. Insomnia can be a short term problem when the wakefulness is related to a certain stress or worry. Long term insomnia is often related to ongoing stress during waking hours and/or poor sleeping habits. Overtime, sleep deprivation itself can make the problem worse. Every little thing feels more severe because you are overtired and your ability to cope is decreased. CAUSES   Stress, anxiety, and depression.  Poor sleeping habits.  Distractions such as TV in the bedroom.  Naps close to bedtime.  Engaging in emotionally charged conversations before  bed.  Technical reading before sleep.  Alcohol and other sedatives. They may make the problem worse. They can hurt normal sleep patterns and normal dream activity.  Stimulants such as caffeine for several hours prior to bedtime.  Pain syndromes and shortness of breath can cause insomnia.  Exercise late at night.  Changing time zones may cause sleeping problems (jet lag). It is sometimes helpful to have someone observe your sleeping patterns. They should look for periods of not breathing during the night (sleep apnea). They should also look to see how long those periods last. If you live alone or observers are uncertain, you can also be observed at a sleep clinic where your sleep patterns will be professionally monitored. Sleep apnea requires a checkup and treatment. Give your caregivers your medical history. Give your caregivers observations your family has made about your sleep.  SYMPTOMS   Not feeling rested in the morning.  Anxiety and restlessness at bedtime.  Difficulty falling and staying asleep. TREATMENT   Your caregiver may prescribe treatment for an underlying medical disorders. Your caregiver can give advice or help if you are using alcohol or other drugs for self-medication. Treatment of underlying problems will usually eliminate insomnia problems.  Medications can be prescribed for short time use. They are generally not recommended for lengthy use.  Over-the-counter sleep medicines are not recommended for lengthy use. They can be  habit forming.  You can promote easier sleeping by making lifestyle changes such as:  Using relaxation techniques that help with breathing and reduce muscle tension.  Exercising earlier in the day.  Changing your diet and the time of your last meal. No night time snacks.  Establish a regular time to go to bed.  Counseling can help with stressful problems and worry.  Soothing music and white noise may be helpful if there are background  noises you cannot remove.  Stop tedious detailed work at least one hour before bedtime. HOME CARE INSTRUCTIONS   Keep a diary. Inform your caregiver about your progress. This includes any medication side effects. See your caregiver regularly. Take note of:  Times when you are asleep.  Times when you are awake during the night.  The quality of your sleep.  How you feel the next day. This information will help your caregiver care for you.  Get out of bed if you are still awake after 15 minutes. Read or do some quiet activity. Keep the lights down. Wait until you feel sleepy and go back to bed.  Keep regular sleeping and waking hours. Avoid naps.  Exercise regularly.  Avoid distractions at bedtime. Distractions include watching television or engaging in any intense or detailed activity like attempting to balance the household checkbook.  Develop a bedtime ritual. Keep a familiar routine of bathing, brushing your teeth, climbing into bed at the same time each night, listening to soothing music. Routines increase the success of falling to sleep faster.  Use relaxation techniques. This can be using breathing and muscle tension release routines. It can also include visualizing peaceful scenes. You can also help control troubling or intruding thoughts by keeping your mind occupied with boring or repetitive thoughts like the old concept of counting sheep. You can make it more creative like imagining planting one beautiful flower after another in your backyard garden.  During your day, work to eliminate stress. When this is not possible use some of the previous suggestions to help reduce the anxiety that accompanies stressful situations. MAKE SURE YOU:   Understand these instructions.  Will watch your condition.  Will get help right away if you are not doing well or get worse. Document Released: 11/24/2000 Document Revised: 02/19/2012 Document Reviewed: 12/25/2007 [facility] Patient  Information 2015 [place], [place]. This information is not intended to replace advice given to you by your health care provider. Make sure you discuss any questions you have with your health care provider.       Preventing High Cholesterol Cholesterol is a waxy, fat-like substance that your body needs in small amounts. Your liver makes all the cholesterol that your body needs. Having high cholesterol (hypercholesterolemia) increases your risk for heart disease and stroke. Extra (excess) cholesterol comes from the food you eat, such as animal-based fat (saturated fat) from meat and some dairy products. High cholesterol can often be prevented with diet and lifestyle changes. If you already have high cholesterol, you can control it with diet and lifestyle changes, as well as medicine. What nutrition changes can be made?  Eat less saturated fat. Foods that contain saturated fat include red meat and some dairy products.  Avoid processed meats, like bacon and lunch meats.  Avoid trans fats, which are found in margarine and some baked goods.  Avoid foods and beverages that have added sugars.  Eat more fruits, vegetables, and whole grains.  Choose healthy sources of protein, such as fish, poultry, and nuts.  Choose  healthy sources of fat, such as: ? Nuts. ? Vegetable oils, especially olive oil. ? Fish that have healthy fats (omega-3 fatty acids), such as mackerel or salmon. What lifestyle changes can be made?   Lose weight if you are overweight. Losing 5-10 lb (2.3-4.5 kg) can help prevent or control high cholesterol and reduce your risk for diabetes and high blood pressure. Ask your health care provider to help you with a diet and exercise plan to safely lose weight.  Get enough exercise. Do at least 150 minutes of moderate-intensity exercise each week. ? You could do this in short exercise sessions several times a day, or you could do longer exercise sessions a few times a week. For  example, you could take a brisk 10-minute walk or bike ride, 3 times a day, for 5 days a week.  Do not smoke. If you need help quitting, ask your health care provider.  Limit your alcohol intake. If you drink alcohol, limit alcohol intake to no more than 1 drink a day for nonpregnant women and 2 drinks a day for men. One drink equals 12 oz of beer, 5 oz of wine, or 1 oz of hard liquor. Why are these changes important?  If you have high cholesterol, deposits (plaques) may build up on the walls of your blood vessels. Plaques make the arteries narrower and stiffer, which can restrict or block blood flow and cause blood clots to form. This greatly increases your risk for heart attack and stroke. Making diet and lifestyle changes can reduce your risk for these life-threatening conditions. What can I do to lower my risk?  Manage your risk factors for high cholesterol. Talk with your health care provider about all of your risk factors and how to lower your risk.  Manage other conditions that you have, such as diabetes or high blood pressure (hypertension).  Have your cholesterol checked at regular intervals.  Keep all follow-up visits as told by your health care provider. This is important. How is this treated? In addition to diet and lifestyle changes, your health care provider may recommend medicines to help lower cholesterol, such as a medicine to reduce the amount of cholesterol made in your liver. You may need medicine if:  Diet and lifestyle changes do not lower your cholesterol enough.  You have high cholesterol and other risk factors for heart disease or stroke. Take over-the-counter and prescription medicines only as told by your health care provider. Where to find more information  American Heart Association: ThisTune.com.pt.jsp  National Heart, Lung, and Blood Institute:  FrenchToiletries.com.cy Summary  High cholesterol increases your risk for heart disease and stroke. By keeping your cholesterol level low, you can reduce your risk for these conditions.  Diet and lifestyle changes are the most important steps in preventing high cholesterol.  Work with your health care provider to manage your risk factors, and have your blood tested regularly. This information is not intended to replace advice given to you by your health care provider. Make sure you discuss any questions you have with your health care provider. Document Released: 12/12/2015 Document Revised: 08/05/2016 Document Reviewed: 08/05/2016 Elsevier Interactive Patient Education  2019 [employer].

## 2019-05-29 ENCOUNTER — Other Ambulatory Visit: Payer: Self-pay | Admitting: Adult Health

## 2019-05-29 LAB — TSH: TSH: 1.42 mIU/L (ref 0.40–4.50)

## 2019-05-29 LAB — COMPLETE METABOLIC PANEL WITH GFR
AG Ratio: 1.7 (calc) (ref 1.0–2.5)
ALT: 5 U/L — ABNORMAL LOW (ref 6–29)
AST: 13 U/L (ref 10–35)
Albumin: 4.3 g/dL (ref 3.6–5.1)
Alkaline phosphatase (APISO): 92 U/L (ref 37–153)
BUN: 11 mg/dL (ref 7–25)
CO2: 28 mmol/L (ref 20–32)
Calcium: 10 mg/dL (ref 8.6–10.4)
Chloride: 103 mmol/L (ref 98–110)
Creat: 0.82 mg/dL (ref 0.60–0.93)
GFR, Est African American: 82 mL/min/{1.73_m2} (ref >=60)
GFR, Est Non African American: 71 mL/min/{1.73_m2} (ref >=60)
Globulin: 2.6 g/dL (calc) (ref 1.9–3.7)
Glucose, Bld: 90 mg/dL (ref 65–99)
Potassium: 4.1 mmol/L (ref 3.5–5.3)
Sodium: 141 mmol/L (ref 135–146)
Total Bilirubin: 0.6 mg/dL (ref 0.2–1.2)
Total Protein: 6.9 g/dL (ref 6.1–8.1)

## 2019-05-29 LAB — CBC WITH DIFFERENTIAL/PLATELET
Absolute Monocytes: 213 cells/uL (ref 200–950)
Basophils Absolute: 19 cells/uL (ref 0–200)
Basophils Relative: 0.5 %
Eosinophils Absolute: 61 cells/uL (ref 15–500)
Eosinophils Relative: 1.6 %
HCT: 38.7 % (ref 35.0–45.0)
Hemoglobin: 12.9 g/dL (ref 11.7–15.5)
Lymphs Abs: 688 cells/uL — ABNORMAL LOW (ref 850–3900)
MCH: 27.3 pg (ref 27.0–33.0)
MCHC: 33.3 g/dL (ref 32.0–36.0)
MCV: 81.8 fL (ref 80.0–100.0)
MPV: 11.5 fL (ref 7.5–12.5)
Monocytes Relative: 5.6 %
Neutro Abs: 2820 cells/uL (ref 1500–7800)
Neutrophils Relative %: 74.2 %
Platelets: 227 10*3/uL (ref 140–400)
RBC: 4.73 10*6/uL (ref 3.80–5.10)
RDW: 14 % (ref 11.0–15.0)
Total Lymphocyte: 18.1 %
WBC: 3.8 10*3/uL (ref 3.8–10.8)

## 2019-05-29 LAB — LIPID PANEL
Cholesterol: 229 mg/dL — ABNORMAL HIGH (ref <200)
HDL: 71 mg/dL (ref >=50)
LDL Cholesterol (Calc): 141 mg/dL (calc) — ABNORMAL HIGH
Non-HDL Cholesterol (Calc): 158 mg/dL (calc) — ABNORMAL HIGH (ref <130)
Total CHOL/HDL Ratio: 3.2 (calc) (ref <5.0)
Triglycerides: 71 mg/dL (ref <150)

## 2019-05-29 LAB — MAGNESIUM: Magnesium: 2 mg/dL (ref 1.5–2.5)

## 2019-05-29 MED ORDER — ROSUVASTATIN CALCIUM 5 MG PO TABS: 5.0000 mg | ORAL_TABLET | Freq: Every day | ORAL | 1 refills | Status: DC

## 2019-06-03 ENCOUNTER — Other Ambulatory Visit: Payer: Self-pay | Admitting: Adult Health

## 2019-06-03 DIAGNOSIS — I10 Essential (primary) hypertension: Secondary | ICD-10-CM

## 2019-06-03 DIAGNOSIS — R519 Headache, unspecified: Secondary | ICD-10-CM

## 2019-08-20 ENCOUNTER — Encounter: Payer: Self-pay | Admitting: Orthopaedic Surgery

## 2019-08-20 ENCOUNTER — Ambulatory Visit: Payer: Self-pay

## 2019-08-20 ENCOUNTER — Ambulatory Visit (INDEPENDENT_AMBULATORY_CARE_PROVIDER_SITE_OTHER): Payer: Medicare Other | Admitting: Orthopaedic Surgery

## 2019-08-20 VITALS — BP 156/87 | HR 84 | Ht 65.5 in | Wt 153.0 lb

## 2019-08-20 DIAGNOSIS — M25562 Pain in left knee: Secondary | ICD-10-CM

## 2019-08-20 DIAGNOSIS — M25572 Pain in left ankle and joints of left foot: Secondary | ICD-10-CM | POA: Diagnosis not present

## 2019-08-20 DIAGNOSIS — M170 Bilateral primary osteoarthritis of knee: Secondary | ICD-10-CM | POA: Insufficient documentation

## 2019-08-20 MED ORDER — LIDOCAINE HCL 1 % IJ SOLN
0.5000 mL | INTRAMUSCULAR | Status: AC | PRN
  Administered 2019-08-20: .5 mL

## 2019-08-20 MED ORDER — BUPIVACAINE HCL 0.5 % IJ SOLN
3.0000 mL | INTRAMUSCULAR | Status: AC | PRN
  Administered 2019-08-20: 3 mL via INTRA_ARTICULAR

## 2019-08-20 MED ORDER — METHYLPREDNISOLONE ACETATE 40 MG/ML IJ SUSP
40.0000 mg | INTRAMUSCULAR | Status: AC | PRN
  Administered 2019-08-20: 40 mg via INTRA_ARTICULAR

## 2019-08-20 NOTE — Progress Notes (Signed)
Office Visit Note   Patient: Emily Jenkins           Date of Birth: 1945-12-21           MRN: [id number] Visit Date: 08/20/2019              Requested by: Unk Pinto, [address],  [place] [address] PCP: Unk Pinto, MD   Assessment & Plan: Visit Diagnoses:  1. Pain in left ankle and joints of left foot   2. Acute pain of left knee   3. Bilateral primary osteoarthritis of knee     Plan: Left knee injection performed which she tolerated well.  She can elevate her left ankle for the swelling but I discussed there is likely related to the knee synovitis and osteoarthritis with secondary swelling of the foot and ankle.  She can return if she has persistent problems.  Follow-Up Instructions: No follow-ups on file.   Orders:  Orders Placed This Encounter  Procedures   XR Ankle Complete Left   XR Knee 1-2 Views Left   No orders of the defined types were placed in this encounter.     Procedures: Large Joint Inj: L knee on 08/20/2019 11:21 AM Indications: joint swelling and pain Details: 22 G 1.5 in needle, anterolateral approach  Arthrogram: No  Medications: 0.5 mL lidocaine 1 %; 3 mL bupivacaine 0.5 %; 40 mg methylPREDNISolone acetate 40 MG/ML Outcome: tolerated well, no immediate complications Procedure, treatment alternatives, risks and benefits explained, specific risks discussed. Consent was given by the patient. Immediately prior to procedure a time out was called to verify the correct patient, procedure, equipment, support staff and site/side marked as required. Patient was prepped and draped in the usual sterile fashion.       Clinical Data: No additional findings.   Subjective: Chief Complaint  Patient presents with   Left Ankle - Pain   Left Knee - Pain    HPI 73 year old female returns with increased problems with her left knee and left ankle.  ORIF bimalleolar ankle fracture 10 years ago in 2010 she is  occasionally had a little bit of swelling but since her left knee is been hurting and she is been limping she is noticed increased swelling in her left ankle.  She has had previous injection in May and her opposite right knee which is worse osteoarthritis in the right knee than left knee.  Right knee continues to do better since the injection.  She has had diclofenac in the past topical creams.  No locking of her left knee but pain and swelling and difficulty walking.  She is using a cane.  She denies chills or fever no history of gout.  Review of Systems 14 point update unchanged from 02/06/2018 office visit other than as mentioned in HPI.  Of note is prediabetes, hypertension and knee osteoarthritis.   Objective: Vital Signs: BP (!) 156/87    Pulse 84    Ht 5' 5.5" (1.664 m)    Wt 153 lb (69.4 kg)    BMI 25.07 kg/m   Physical Exam Constitutional:      Appearance: She is well-developed.  HENT:     Head: Normocephalic.     Right Ear: External ear normal.     Left Ear: External ear normal.  Eyes:     Pupils: Pupils are equal, round, and reactive to light.  Neck:     Thyroid: No thyromegaly.     Trachea: No  tracheal deviation.  Cardiovascular:     Rate and Rhythm: Normal rate.  Pulmonary:     Effort: Pulmonary effort is normal.  Abdominal:     Palpations: Abdomen is soft.  Skin:    General: Skin is warm and dry.  Neurological:     Mental Status: She is alert and oriented to person, place, and time.  Psychiatric:        Behavior: Behavior normal.     Ortho Exam patient has 2+ effusion left knee crepitus with range of motion medial lateral joint line tenderness.  Patellar tracking is normal pain with patellar loading and quadriceps contracture.  Collateral crucial ligament exam is normal.  Well-healed medial lateral ankle incisions mild swelling of the ankle.  This does not appear to be coming from the ankle joint anteriorly across the tibiotalar joint.  Posterior tib is active normal.   Pulses are normal sensation the foot is normal.  Specialty Comments:  No specialty comments available.  Imaging: No results found.   PMFS History: Patient Active Problem List   Diagnosis Date Noted   Bilateral primary osteoarthritis of knee 08/20/2019   Arthritis of right knee 11/11/2018   Overweight (BMI 25.0-29.9) 10/29/2015   Osteoporosis 06/07/2015   GERD  03/04/2015   Medication management 01/05/2014   Essential hypertension 12/02/2013   Mixed hyperlipidemia 12/02/2013   Other abnormal glucose (prediabetes) 12/02/2013   Vitamin D deficiency 12/02/2013   Varicose veins of both lower extremities 09/16/2012   Past Medical History:  Diagnosis Date   DJD (degenerative joint disease)    GERD (gastroesophageal reflux disease)    Hyperlipidemia    Hypertension    Pre-diabetes    RLS (restless legs syndrome)     Family History  Problem Relation Age of Onset   Heart disease Mother 71       stent   Hypertension Mother    Cancer Father    Diabetes Father    Hyperlipidemia Sister    Hypertension Sister     Past Surgical History:  Procedure Laterality Date   ANKLE SURGERY     CHOLECYSTECTOMY     LEFT HEART CATHETERIZATION WITH CORONARY ANGIOGRAM N/A 07/29/2014   Procedure: LEFT HEART CATHETERIZATION WITH CORONARY ANGIOGRAM;  Surgeon: Burnell Blanks, MD;  Location: [facility] CATH LAB;  Service: Cardiovascular;  Laterality: N/A;   TUBAL LIGATION     Social History   Occupational History   Not on file  Tobacco Use   Smoking status: Never Smoker   Smokeless tobacco: Never Used   Tobacco comment: SPOUSE SMOKES  Substance and Sexual Activity   Alcohol use: No    Alcohol/week: 0.0 standard drinks   Drug use: No   Sexual activity: Yes

## 2019-08-25 ENCOUNTER — Ambulatory Visit: Payer: Self-pay | Admitting: Internal Medicine

## 2019-09-03 ENCOUNTER — Encounter: Payer: Self-pay | Admitting: Internal Medicine

## 2019-09-03 NOTE — Progress Notes (Signed)
History of Present Illness:      This very nice 73 y.o. [name] presents for 6 month follow up with HTN, HLD, Pre-Diabetes and Vitamin D Deficiency. Patient has GERD controlled on her meds.      Patient is treated for HTN (1990's) & BP has been controlled at home. Today's BP is at goal - 138/84. Patient has had no complaints of any cardiac type chest pain, palpitations, dyspnea / orthopnea / PND, dizziness, claudication, or dependent edema.      Hyperlipidemia is controlled with diet & meds. Patient denies myalgias or other med SE's. Last Lipids were not at goal:  Lab Results  Component Value Date   CHOL 234 (H) 09/04/2019   HDL 74 09/04/2019   LDLCALC 142 (H) 09/04/2019   TRIG 76 09/04/2019   CHOLHDL 3.2 09/04/2019   (A1c 5.9%/2013)     Also, the patient has history of PreDiabetes (A1c 5.9%/ 2013) and has had no symptoms of reactive hypoglycemia, diabetic polys, paresthesias or vis ual blurring.  Last A1c was Normal & at goal:  Lab Results  Component Value Date   HGBA1C 5.7 (H) 09/04/2019       Further, the patient also has history of Vitamin D Deficiency ("41" / 2014)  and supplements vitamin D without any suspected side-effects. Last vitamin D was at goal:  Lab Results  Component Value Date   VD25OH 56 09/04/2019   Current Outpatient Medications on File Prior to Visit  Medication Sig  . aspirin 81 MG tablet Take 81 mg by mouth every evening.   Marland Kitchen atenolol (TENORMIN) 50 MG tablet Take 1 tablet Daily at Suppertime for BP & Palpitations  . cholecalciferol (VITAMIN D) 1000 units tablet Take 5,000 Units by mouth daily.   . pantoprazole (PROTONIX) 40 MG tablet Take 1 tablet 2 x /day for Acid Indigestion & Reflux  . polyethylene glycol (MIRALAX / GLYCOLAX) packet Take 17 g by mouth daily.   . rosuvastatin (CRESTOR) 5 MG tablet Take 1 tablet (5 mg total) by mouth daily.  Marland Kitchen telmisartan (MICARDIS) 80 MG tablet Take 1 tablet by mouth once daily  . vitamin C (ASCORBIC ACID) 500 MG  tablet Take 500 mg by mouth daily.   No current facility-administered medications on file prior to visit.    Allergies  Allergen Reactions  . Escitalopram Itching  . Fosamax [Alendronate Sodium] Itching  . Levofloxacin     Headache, chest pain, muscle aches  . Sulfa Antibiotics Itching  . Tramadol Itching    PMHx:   Past Medical History:  Diagnosis Date  . DJD (degenerative joint disease)   . GERD (gastroesophageal reflux disease)   . Hyperlipidemia   . Hypertension   . Pre-diabetes   . RLS (restless legs syndrome)    Immunization History  Administered Date(s) Administered  . DTaP 12/11/2000  . Influenza Split 10/24/2013  . Influenza, High Dose Seasonal PF 09/17/2014, 09/09/2015, 08/21/2016, 09/21/2017, 10/14/2018  . Pneumococcal Conjugate-13 08/21/2016  . Pneumococcal Polysaccharide-23 12/11/2002, 01/08/2018  . Tdap 07/11/2012  . Zoster 08/06/2013   Past Surgical History:  Procedure Laterality Date  . ANKLE SURGERY    . CHOLECYSTECTOMY    . LEFT HEART CATHETERIZATION WITH CORONARY ANGIOGRAM N/A 07/29/2014   Procedure: LEFT HEART CATHETERIZATION WITH CORONARY ANGIOGRAM;  Surgeon: Burnell Blanks, MD;  Location: [facility] CATH LAB;  Service: Cardiovascular;  Laterality: N/A;  . TUBAL LIGATION     FHx:    Reviewed / unchanged  SHx:  Reviewed / unchanged   Systems Review:  Constitutional: Denies fever, chills, wt changes, headaches, insomnia, fatigue, night sweats, change in appetite. Eyes: Denies redness, blurred vision, diplopia, discharge, itchy, watery eyes.  ENT: Denies discharge, congestion, post nasal drip, epistaxis, sore throat, earache, hearing loss, dental pain, tinnitus, vertigo, sinus pain, snoring.  CV: Denies chest pain, palpitations, irregular heartbeat, syncope, dyspnea, diaphoresis, orthopnea, PND, claudication or edema. Respiratory: denies cough, dyspnea, DOE, pleurisy, hoarseness, laryngitis, wheezing.  Gastrointestinal: Denies dysphagia,  odynophagia, heartburn, reflux, water brash, abdominal pain or cramps, nausea, vomiting, bloating, diarrhea, constipation, hematemesis, melena, hematochezia  or hemorrhoids. Genitourinary: Denies dysuria, frequency, urgency, nocturia, hesitancy, discharge, hematuria or flank pain. Musculoskeletal: Denies arthralgias, myalgias, stiffness, jt. swelling, pain, limping or strain/sprain.  Skin: Denies pruritus, rash, hives, warts, acne, eczema or change in skin lesion(s). Neuro: No weakness, tremor, incoordination, spasms, paresthesia or pain. Psychiatric: Denies confusion, memory loss or sensory loss. Endo: Denies change in weight, skin or hair change.  Heme/Lymph: No excessive bleeding, bruising or enlarged lymph nodes.  Physical Exam  BP 138/84   Pulse 88   Temp (!) 97.1 F (36.2 C)   Ht 5' 5.5" (1.664 m)   Wt 155 lb (70.3 kg)   HC 16" (40.6 cm)   BMI 25.40 kg/m   Appears  well nourished, well groomed  and in no distress.  Eyes: PERRLA, EOMs, conjunctiva no swelling or erythema. Sinuses: No frontal/maxillary tenderness ENT/Mouth: EAC's clear, TM's nl w/o erythema, bulging. Nares clear w/o erythema, swelling, exudates. Oropharynx clear without erythema or exudates. Oral hygiene is good. Tongue normal, non obstructing. Hearing intact.  Neck: Supple. Thyroid not palpable. Car 2+/2+ without bruits, nodes or JVD. Chest: Respirations nl with BS clear & equal w/o rales, rhonchi, wheezing or stridor.  Cor: Heart sounds normal w/ regular rate and rhythm without sig. murmurs, gallops, clicks or rubs. Peripheral pulses normal and equal  without edema.  Abdomen: Soft & bowel sounds normal. Non-tender w/o guarding, rebound, hernias, masses or organomegaly.  Lymphatics: Unremarkable.  Musculoskeletal: Full ROM all peripheral extremities, joint stability, 5/5 strength and normal gait.  Skin: Warm, dry without exposed rashes, lesions or ecchymosis apparent.  Neuro: Cranial nerves intact, reflexes  equal bilaterally. Sensory-motor testing grossly intact. Tendon reflexes grossly intact.  Pysch: Alert & oriented x 3.  Insight and judgement nl & appropriate. No ideations.  Assessment and Plan:  1. Essential hypertension  - Continue medication, monitor blood pressure at home.  - Continue DASH diet.  Reminder to go to the ER if any CP,  SOB, nausea, dizziness, severe HA, changes vision/speech.  - CBC with Differential/Platelet - COMPLETE METABOLIC PANEL WITH GFR - Magnesium - TSH  2. Hyperlipidemia, mixed  - Continue diet/meds, exercise,& lifestyle modifications.  - Continue monitor periodic cholesterol/liver & renal functions   - Lipid panel - TSH  3. Abnormal glucose  - Continue diet, exercise  - Lifestyle modifications.  - Monitor appropriate labs.  - Hemoglobin A1c - Insulin, random  4. Vitamin D deficiency  - Continue supplementation.  - VITAMIN D 25 Hydroxyl  5. Prediabetes  - Hemoglobin A1c - Insulin, random  6. Gastroesophageal reflux disease  - CBC with Differential/Platelet  7. Medication management  - CBC with Differential/Platelet - COMPLETE METABOLIC PANEL WITH GFR - Magnesium - Lipid panel - TSH - Hemoglobin A1c - Insulin, random - VITAMIN D 25 Hydroxy;        Discussed  regular exercise, BP monitoring, weight control to achieve/maintain BMI less than 25 and  discussed med and SE's. Recommended labs to assess and monitor clinical status with further disposition pending results of labs.  I discussed the assessment and treatment plan with the patient. The patient was provided an opportunity to ask questions and all were answered. The patient agreed with the plan and demonstrated an understanding of the instructions.  I provided over 30 minutes of exam, counseling, chart review and  complex critical decision making.  Kirtland Bouchard, MD

## 2019-09-03 NOTE — Patient Instructions (Signed)

## 2019-09-04 ENCOUNTER — Other Ambulatory Visit: Payer: Self-pay

## 2019-09-04 ENCOUNTER — Ambulatory Visit (INDEPENDENT_AMBULATORY_CARE_PROVIDER_SITE_OTHER): Payer: Medicare Other | Admitting: Internal Medicine

## 2019-09-04 VITALS — BP 138/84 | HR 88 | Temp 97.1°F | Ht 65.5 in | Wt 155.0 lb

## 2019-09-04 DIAGNOSIS — R7303 Prediabetes: Secondary | ICD-10-CM

## 2019-09-04 DIAGNOSIS — E559 Vitamin D deficiency, unspecified: Secondary | ICD-10-CM | POA: Diagnosis not present

## 2019-09-04 DIAGNOSIS — I10 Essential (primary) hypertension: Secondary | ICD-10-CM | POA: Diagnosis not present

## 2019-09-04 DIAGNOSIS — R7309 Other abnormal glucose: Secondary | ICD-10-CM

## 2019-09-04 DIAGNOSIS — E782 Mixed hyperlipidemia: Secondary | ICD-10-CM

## 2019-09-04 DIAGNOSIS — K219 Gastro-esophageal reflux disease without esophagitis: Secondary | ICD-10-CM

## 2019-09-04 DIAGNOSIS — Z79899 Other long term (current) drug therapy: Secondary | ICD-10-CM

## 2019-09-05 LAB — COMPLETE METABOLIC PANEL WITH GFR
AG Ratio: 1.7 (calc) (ref 1.0–2.5)
ALT: 9 U/L (ref 6–29)
AST: 17 U/L (ref 10–35)
Albumin: 4.5 g/dL (ref 3.6–5.1)
Alkaline phosphatase (APISO): 97 U/L (ref 37–153)
BUN: 14 mg/dL (ref 7–25)
CO2: 26 mmol/L (ref 20–32)
Calcium: 9.7 mg/dL (ref 8.6–10.4)
Chloride: 104 mmol/L (ref 98–110)
Creat: 0.73 mg/dL (ref 0.60–0.93)
GFR, Est African American: 94 mL/min/{1.73_m2} (ref >=60)
GFR, Est Non African American: 81 mL/min/{1.73_m2} (ref >=60)
Globulin: 2.7 g/dL (calc) (ref 1.9–3.7)
Glucose, Bld: 77 mg/dL (ref 65–99)
Potassium: 4.2 mmol/L (ref 3.5–5.3)
Sodium: 141 mmol/L (ref 135–146)
Total Bilirubin: 0.6 mg/dL (ref 0.2–1.2)
Total Protein: 7.2 g/dL (ref 6.1–8.1)

## 2019-09-05 LAB — CBC WITH DIFFERENTIAL/PLATELET
Absolute Monocytes: 228 cells/uL (ref 200–950)
Basophils Absolute: 59 cells/uL (ref 0–200)
Basophils Relative: 1.8 %
Eosinophils Absolute: 99 cells/uL (ref 15–500)
Eosinophils Relative: 3 %
HCT: 41.6 % (ref 35.0–45.0)
Hemoglobin: 13.5 g/dL (ref 11.7–15.5)
Lymphs Abs: 1033 cells/uL (ref 850–3900)
MCH: 26.6 pg — ABNORMAL LOW (ref 27.0–33.0)
MCHC: 32.5 g/dL (ref 32.0–36.0)
MCV: 81.9 fL (ref 80.0–100.0)
MPV: 11.3 fL (ref 7.5–12.5)
Monocytes Relative: 6.9 %
Neutro Abs: 1881 cells/uL (ref 1500–7800)
Neutrophils Relative %: 57 %
Platelets: 265 10*3/uL (ref 140–400)
RBC: 5.08 10*6/uL (ref 3.80–5.10)
RDW: 13.9 % (ref 11.0–15.0)
Total Lymphocyte: 31.3 %
WBC: 3.3 10*3/uL — ABNORMAL LOW (ref 3.8–10.8)

## 2019-09-05 LAB — LIPID PANEL
Cholesterol: 234 mg/dL — ABNORMAL HIGH (ref <200)
HDL: 74 mg/dL (ref >=50)
LDL Cholesterol (Calc): 142 mg/dL (calc) — ABNORMAL HIGH
Non-HDL Cholesterol (Calc): 160 mg/dL (calc) — ABNORMAL HIGH (ref <130)
Total CHOL/HDL Ratio: 3.2 (calc) (ref <5.0)
Triglycerides: 76 mg/dL (ref <150)

## 2019-09-05 LAB — HEMOGLOBIN A1C
Hgb A1c MFr Bld: 5.7 % of total Hgb — ABNORMAL HIGH (ref <5.7)
Mean Plasma Glucose: 117 (calc)
eAG (mmol/L): 6.5 (calc)

## 2019-09-05 LAB — TSH: TSH: 1.79 mIU/L (ref 0.40–4.50)

## 2019-09-05 LAB — INSULIN, RANDOM: Insulin: 3.1 u[IU]/mL

## 2019-09-05 LAB — VITAMIN D 25 HYDROXY (VIT D DEFICIENCY, FRACTURES): Vit D, 25-Hydroxy: 56 ng/mL (ref 30–100)

## 2019-09-05 LAB — MAGNESIUM: Magnesium: 1.9 mg/dL (ref 1.5–2.5)

## 2019-09-17 ENCOUNTER — Ambulatory Visit: Payer: Medicare Other

## 2019-09-24 ENCOUNTER — Ambulatory Visit: Payer: Medicare Other

## 2019-09-29 ENCOUNTER — Other Ambulatory Visit: Payer: Self-pay

## 2019-09-29 DIAGNOSIS — Z1212 Encounter for screening for malignant neoplasm of rectum: Secondary | ICD-10-CM

## 2019-09-29 DIAGNOSIS — Z1211 Encounter for screening for malignant neoplasm of colon: Secondary | ICD-10-CM

## 2019-09-29 LAB — POC HEMOCCULT BLD/STL (HOME/3-CARD/SCREEN)
Card #2 Fecal Occult Blod, POC: NEGATIVE
Card #3 Fecal Occult Blood, POC: NEGATIVE
Fecal Occult Blood, POC: NEGATIVE

## 2019-10-13 ENCOUNTER — Ambulatory Visit (INDEPENDENT_AMBULATORY_CARE_PROVIDER_SITE_OTHER): Payer: Medicare Other

## 2019-10-13 ENCOUNTER — Other Ambulatory Visit: Payer: Self-pay

## 2019-10-13 VITALS — Temp 97.3°F

## 2019-10-13 DIAGNOSIS — Z23 Encounter for immunization: Secondary | ICD-10-CM

## 2019-10-13 NOTE — Progress Notes (Signed)
REPORTS for HD flu vaccine

## 2019-10-15 ENCOUNTER — Ambulatory Visit: Payer: Medicare Other | Admitting: Orthopaedic Surgery

## 2019-10-28 ENCOUNTER — Encounter: Payer: Self-pay | Admitting: Orthopaedic Surgery

## 2019-10-28 ENCOUNTER — Other Ambulatory Visit: Payer: Self-pay

## 2019-10-28 ENCOUNTER — Ambulatory Visit: Payer: Medicare Other | Admitting: Orthopaedic Surgery

## 2019-10-28 ENCOUNTER — Ambulatory Visit: Payer: Self-pay

## 2019-10-28 VITALS — BP 161/87 | HR 85 | Ht 65.5 in | Wt 150.0 lb

## 2019-10-28 DIAGNOSIS — M545 Low back pain: Secondary | ICD-10-CM

## 2019-10-28 DIAGNOSIS — M170 Bilateral primary osteoarthritis of knee: Secondary | ICD-10-CM | POA: Diagnosis not present

## 2019-10-28 DIAGNOSIS — G8929 Other chronic pain: Secondary | ICD-10-CM | POA: Diagnosis not present

## 2019-10-28 NOTE — Progress Notes (Addendum)
Office Visit Note   Patient: Emily Jenkins           Date of Birth: [birth year]           MRN: [id number] Visit Date: 10/28/2019              Requested by: Unk Pinto, [address] [place],  [address] [address] PCP: Unk Pinto, MD   Assessment & Plan: Visit Diagnoses:  1. Chronic bilateral low back pain, unspecified whether sciatica present   2. Bilateral primary osteoarthritis of knee     Plan: Patient has bilateral knee osteoarthritis.  We discussed total knee arthroplasty with her.  She has a sister lives in [place].  She lives in [place].  We discussed with Covid currently she would best be able to stay with her sister for couple weeks after the surgery and have home therapy and avoid skilled nursing facility.  We will plan on rechecking her back again in 2 months.  Total knee arthroplasty discussed including postop home therapy followed by outpatient therapy.  Patient's son and also her sister are both working.  She understands she have to talk with him to make some arrangements for some additional family care. Continue ice , she can try some aspercreme to her knees.  Follow-Up Instructions: Return in about 2 months (around 12/28/2019).   Orders:  Orders Placed This Encounter  Procedures  . XR Lumbar Spine 2-3 Views   No orders of the defined types were placed in this encounter.     Procedures: No procedures performed   Clinical Data: No additional findings.   Subjective: Chief Complaint  Patient presents with  . Lower Back - Pain  . Left Leg - Pain  . Right Leg - Pain    HPI 73 year old female returns 8-week follow-up with continued problems with bilateral knee pain and difficulty ambulating.  She states she got relief from the injections last done in September.  She is having pain in both legs walks with a short stride gait with both knees flexed 20 degrees.  She states she has trouble walking.  She is taken  Tylenol arthritis.  She is on 1 aspirin a day and also has thigh-high support stockings.  Previous knee x-rays show moderate to severe knee osteoarthritis slightly worse on the right than the left knee tricompartmental.  Review of Systems positive for bilateral knee osteoarthritis.  History of back pain with some facet arthritis.  Varicose veins, vitamin D deficiency, hypertension.  Some memory loss associated with age.   Objective: Vital Signs: BP (!) 161/87   Pulse 85   Ht 5' 5.5" (1.664 m)   Wt 150 lb (68 kg)   BMI 24.58 kg/m   Physical Exam Constitutional:      Appearance: She is well-developed.  HENT:     Head: Normocephalic.     Right Ear: External ear normal.     Left Ear: External ear normal.  Eyes:     Pupils: Pupils are equal, round, and reactive to light.  Neck:     Thyroid: No thyromegaly.     Trachea: No tracheal deviation.  Cardiovascular:     Rate and Rhythm: Normal rate.  Pulmonary:     Effort: Pulmonary effort is normal.  Abdominal:     Palpations: Abdomen is soft.  Skin:    General: Skin is warm and dry.  Neurological:     Mental Status: She is alert and oriented to person, place,  and time.  Psychiatric:        Behavior: Behavior normal.     Ortho Exam Negative logroll of the hips posterior tib pulses are palpable right and left.  She has crepitus with knee range of motion.  She ambulates with out reaching full knee extension with ambulation.  Medial lateral joint line tenderness with palpation. Specialty Comments:  No specialty comments available.  Imaging: No results found.   PMFS History: Patient Active Problem List   Diagnosis Date Noted  . Bilateral primary osteoarthritis of knee 08/20/2019  . Arthritis of right knee 11/11/2018  . Overweight (BMI 25.0-29.9) 10/29/2015  . Osteoporosis 06/07/2015  . GERD  03/04/2015  . Medication management 01/05/2014  . Essential hypertension 12/02/2013  . Mixed hyperlipidemia 12/02/2013  . Other  abnormal glucose (prediabetes) 12/02/2013  . Vitamin D deficiency 12/02/2013  . Varicose veins of both lower extremities 09/16/2012   Past Medical History:  Diagnosis Date  . DJD (degenerative joint disease)   . GERD (gastroesophageal reflux disease)   . Hyperlipidemia   . Hypertension   . Pre-diabetes   . RLS (restless legs syndrome)     Family History  Problem Relation Age of Onset  . Heart disease Mother 7       stent  . Hypertension Mother   . Cancer Father   . Diabetes Father   . Hyperlipidemia Sister   . Hypertension Sister     Past Surgical History:  Procedure Laterality Date  . ANKLE SURGERY    . CHOLECYSTECTOMY    . LEFT HEART CATHETERIZATION WITH CORONARY ANGIOGRAM N/A 07/29/2014   Procedure: LEFT HEART CATHETERIZATION WITH CORONARY ANGIOGRAM;  Surgeon: Burnell Blanks, MD;  Location: [facility] CATH LAB;  Service: Cardiovascular;  Laterality: N/A;  . TUBAL LIGATION     Social History   Occupational History  . Not on file  Tobacco Use  . Smoking status: Never Smoker  . Smokeless tobacco: Never Used  . Tobacco comment: SPOUSE SMOKES  Substance and Sexual Activity  . Alcohol use: No    Alcohol/week: 0.0 standard drinks  . Drug use: No  . Sexual activity: Yes

## 2019-11-11 ENCOUNTER — Other Ambulatory Visit: Payer: Self-pay | Admitting: Adult Health

## 2019-11-11 DIAGNOSIS — I10 Essential (primary) hypertension: Secondary | ICD-10-CM

## 2019-11-11 DIAGNOSIS — R519 Headache, unspecified: Secondary | ICD-10-CM

## 2019-11-14 ENCOUNTER — Ambulatory Visit: Payer: Self-pay | Admitting: Adult Health

## 2019-11-14 LAB — HM MAMMOGRAPHY

## 2019-11-24 ENCOUNTER — Encounter: Payer: Self-pay | Admitting: Internal Medicine

## 2019-12-11 ENCOUNTER — Other Ambulatory Visit: Payer: Self-pay | Admitting: *Deleted

## 2019-12-11 ENCOUNTER — Other Ambulatory Visit: Payer: Self-pay

## 2019-12-11 ENCOUNTER — Ambulatory Visit: Payer: Medicare Other | Admitting: Internal Medicine

## 2019-12-11 ENCOUNTER — Ambulatory Visit: Payer: Medicare Other | Admitting: Adult Health Nurse Practitioner

## 2019-12-11 VITALS — BP 138/82 | HR 84 | Temp 97.4°F | Resp 16 | Ht 65.5 in | Wt 165.4 lb

## 2019-12-11 DIAGNOSIS — I10 Essential (primary) hypertension: Secondary | ICD-10-CM

## 2019-12-11 DIAGNOSIS — K2100 Gastro-esophageal reflux disease with esophagitis, without bleeding: Secondary | ICD-10-CM

## 2019-12-11 DIAGNOSIS — E559 Vitamin D deficiency, unspecified: Secondary | ICD-10-CM

## 2019-12-11 DIAGNOSIS — R7303 Prediabetes: Secondary | ICD-10-CM

## 2019-12-11 DIAGNOSIS — R7309 Other abnormal glucose: Secondary | ICD-10-CM

## 2019-12-11 DIAGNOSIS — E782 Mixed hyperlipidemia: Secondary | ICD-10-CM | POA: Diagnosis not present

## 2019-12-11 DIAGNOSIS — Z79899 Other long term (current) drug therapy: Secondary | ICD-10-CM

## 2019-12-11 NOTE — Progress Notes (Signed)
History of Present Illness:      This very nice 73 y.o. [name] presents for 3 month follow up with HTN, HLD, Pre-Diabetes and Vitamin D Deficiency. Patient has GERD controlled on her meds. She is being followed by Dr Lorin Mercy for bilateral knee pains      Patient is treated for HTN & BP has been controlled at home. Today's BP is at goal - 138/82. Patient has had no complaints of any cardiac type chest pain, palpitations, dyspnea / orthopnea / PND, dizziness, claudication, or dependent edema.      Hyperlipidemia is controlled with diet & meds. Patient denies myalgias or other med SE's. Current Lipids are not at goal:  Lab Results  Component Value Date   CHOL 165 12/11/2019   HDL 68 12/11/2019   LDLCALC 84 12/11/2019   TRIG 52 12/11/2019   CHOLHDL 2.4 12/11/2019        Also, the patient has history of PreDiabetes and has had no symptoms of reactive hypoglycemia, diabetic polys, paresthesias or visual blurring.  Last A1c was near goal:  Lab Results  Component Value Date   HGBA1C 5.7 (H) 09/04/2019        Further, the patient also has history of Vitamin D Deficiency and supplements vitamin D without any suspected side-effects. Current Vitamin D was near goal:  Lab Results  Component Value Date   VD25OH 58 12/11/2019    Current Outpatient Medications on File Prior to Visit  Medication Sig  . aspirin 81 MG tablet Take 81 mg by mouth every evening.   Marland Kitchen atenolol (TENORMIN) 50 MG tablet Take 1 tablet Daily at Suppertime for BP & Palpitations  . cholecalciferol (VITAMIN D) 1000 units tablet Take 5,000 Units by mouth daily.   . pantoprazole (PROTONIX) 40 MG tablet Take 1 tablet 2 x /day for Acid Indigestion & Reflux  . polyethylene glycol (MIRALAX / GLYCOLAX) packet Take 17 g by mouth daily.   . rosuvastatin (CRESTOR) 5 MG tablet Take 1 tablet (5 mg total) by mouth daily.  Marland Kitchen telmisartan (MICARDIS) 80 MG tablet Take 1 tablet by mouth once daily  . vitamin C (ASCORBIC ACID) 500 MG  tablet Take 500 mg by mouth daily.   No current facility-administered medications on file prior to visit.    Allergies  Allergen Reactions  . Escitalopram Itching  . Fosamax [Alendronate Sodium] Itching  . Levofloxacin     Headache, chest pain, muscle aches  . Sulfa Antibiotics Itching  . Tramadol Itching    PMHx:   Past Medical History:  Diagnosis Date  . DJD (degenerative joint disease)   . GERD (gastroesophageal reflux disease)   . Hyperlipidemia   . Hypertension   . Pre-diabetes   . RLS (restless legs syndrome)    Immunization History  Administered Date(s) Administered  . DTaP 12/11/2000  . Influenza Split 10/24/2013  . Influenza, High Dose Seasonal PF 09/17/2014, 09/09/2015, 08/21/2016, 09/21/2017, 10/14/2018, 10/13/2019  . Pneumococcal Conjugate-13 08/21/2016  . Pneumococcal Polysaccharide-23 12/11/2002, 01/08/2018  . Tdap 07/11/2012  . Zoster 08/06/2013   Past Surgical History:  Procedure Laterality Date  . ANKLE SURGERY    . CHOLECYSTECTOMY    . LEFT HEART CATHETERIZATION WITH CORONARY ANGIOGRAM N/A 07/29/2014   Procedure: LEFT HEART CATHETERIZATION WITH CORONARY ANGIOGRAM;  Surgeon: Burnell Blanks, MD;  Location: [facility] CATH LAB;  Service: Cardiovascular;  Laterality: N/A;  . TUBAL LIGATION      FHx:    Reviewed / unchanged  SHx:    Reviewed / unchanged   Systems Review:  Constitutional: Denies fever, chills, wt changes, headaches, insomnia, fatigue, night sweats, change in appetite. Eyes: Denies redness, blurred vision, diplopia, discharge, itchy, watery eyes.  ENT: Denies discharge, congestion, post nasal drip, epistaxis, sore throat, earache, hearing loss, dental pain, tinnitus, vertigo, sinus pain, snoring.  CV: Denies chest pain, palpitations, irregular heartbeat, syncope, dyspnea, diaphoresis, orthopnea, PND, claudication or edema. Respiratory: denies cough, dyspnea, DOE, pleurisy, hoarseness, laryngitis, wheezing.  Gastrointestinal: Denies  dysphagia, odynophagia, heartburn, reflux, water brash, abdominal pain or cramps, nausea, vomiting, bloating, diarrhea, constipation, hematemesis, melena, hematochezia  or hemorrhoids. Genitourinary: Denies dysuria, frequency, urgency, nocturia, hesitancy, discharge, hematuria or flank pain. Musculoskeletal: Denies arthralgias, myalgias, stiffness, jt. swelling, pain, limping or strain/sprain.  Skin: Denies pruritus, rash, hives, warts, acne, eczema or change in skin lesion(s). Neuro: No weakness, tremor, incoordination, spasms, paresthesia or pain. Psychiatric: Denies confusion, memory loss or sensory loss. Endo: Denies change in weight, skin or hair change.  Heme/Lymph: No excessive bleeding, bruising or enlarged lymph nodes.  Physical Exam  BP 138/82   Pulse 84   Temp (!) 97.4 F (36.3 C)   Resp 16   Ht 5' 5.5" (1.664 m)   Wt 165 lb 6.4 oz (75 kg)   BMI 27.11 kg/m   Appears  well nourished, well groomed  and in no distress.  Eyes: PERRLA, EOMs, conjunctiva no swelling or erythema. Sinuses: No frontal/maxillary tenderness ENT/Mouth: EAC's clear, TM's nl w/o erythema, bulging. Nares clear w/o erythema, swelling, exudates. Oropharynx clear without erythema or exudates. Oral hygiene is good. Tongue normal, non obstructing. Hearing intact.  Neck: Supple. Thyroid not palpable. Car 2+/2+ without bruits, nodes or JVD. Chest: Respirations nl with BS clear & equal w/o rales, rhonchi, wheezing or stridor.  Cor: Heart sounds normal w/ regular rate and rhythm without sig. murmurs, gallops, clicks or rubs. Peripheral pulses normal and equal  without edema.  Abdomen: Soft & bowel sounds normal. Non-tender w/o guarding, rebound, hernias, masses or organomegaly.  Lymphatics: Unremarkable.  Musculoskeletal: Full ROM all peripheral extremities, joint stability, 5/5 strength and normal gait.  Skin: Warm, dry without exposed rashes, lesions or ecchymosis apparent.  Neuro: Cranial nerves intact,  reflexes equal bilaterally. Sensory-motor testing grossly intact. Tendon reflexes grossly intact.  Pysch: Alert & oriented x 3.  Insight and judgement nl & appropriate. No ideations.  Assessment and Plan:  1. Essential hypertension  - Continue medication, monitor blood pressure at home.  - Continue DASH diet.  Reminder to go to the ER if any CP,  SOB, nausea, dizziness, severe HA, changes vision/speech.  - CBC with Diff - COMPLETE METABOLIC PANEL WITH GFR - Magnesium - TSH  2. Hyperlipidemia, mixed  - Continue diet/meds, exercise,& lifestyle modifications.  - Continue monitor periodic cholesterol/liver & renal functions   - Lipid Profile - TSH  3. Abnormal glucose  - Continue diet, exercise  - Lifestyle modifications.  - Monitor appropriate labs.  - Insulin, random - Fructosamine  4. Vitamin D deficiency  - Continue supplementation.  - Vitamin D (25 hydroxy)  5. Prediabetes  - Insulin, random - Fructosamine  6. Medication management  - CBC with Diff - COMPLETE METABOLIC PANEL WITH GFR - Magnesium - Lipid Profile - TSH - Insulin, random - Vitamin D (25 hydroxy) - Fructosamine       Discussed  regular exercise, BP monitoring, weight control to achieve/maintain BMI less than 25 and discussed med and SE's. Recommended labs to assess and monitor  clinical status with further disposition pending results of labs.  I discussed the assessment and treatment plan with the patient. The patient was provided an opportunity to ask questions and all were answered. The patient agreed with the plan and demonstrated an understanding of the instructions.  I provided over 30 minutes of exam, counseling, chart review and  complex critical decision making.  Kirtland Bouchard, MD

## 2019-12-11 NOTE — Patient Instructions (Signed)

## 2019-12-12 ENCOUNTER — Encounter: Payer: Self-pay | Admitting: Internal Medicine

## 2019-12-21 LAB — COMPLETE METABOLIC PANEL WITH GFR
AG Ratio: 1.8 (calc) (ref 1.0–2.5)
ALT: 9 U/L (ref 6–29)
AST: 15 U/L (ref 10–35)
Albumin: 4.4 g/dL (ref 3.6–5.1)
Alkaline phosphatase (APISO): 95 U/L (ref 37–153)
BUN: 12 mg/dL (ref 7–25)
CO2: 30 mmol/L (ref 20–32)
Calcium: 9.9 mg/dL (ref 8.6–10.4)
Chloride: 105 mmol/L (ref 98–110)
Creat: 0.66 mg/dL (ref 0.60–0.93)
GFR, Est African American: 101 mL/min/{1.73_m2} (ref >=60)
GFR, Est Non African American: 87 mL/min/{1.73_m2} (ref >=60)
Globulin: 2.5 g/dL (calc) (ref 1.9–3.7)
Glucose, Bld: 106 mg/dL — ABNORMAL HIGH (ref 65–99)
Potassium: 4.3 mmol/L (ref 3.5–5.3)
Sodium: 142 mmol/L (ref 135–146)
Total Bilirubin: 0.6 mg/dL (ref 0.2–1.2)
Total Protein: 6.9 g/dL (ref 6.1–8.1)

## 2019-12-21 LAB — LIPID PANEL
Cholesterol: 165 mg/dL (ref <200)
HDL: 68 mg/dL (ref >=50)
LDL Cholesterol (Calc): 84 mg/dL (calc)
Non-HDL Cholesterol (Calc): 97 mg/dL (calc) (ref <130)
Total CHOL/HDL Ratio: 2.4 (calc) (ref <5.0)
Triglycerides: 52 mg/dL (ref <150)

## 2019-12-21 LAB — CBC WITH DIFFERENTIAL/PLATELET
Absolute Monocytes: 221 cells/uL (ref 200–950)
Basophils Absolute: 31 cells/uL (ref 0–200)
Basophils Relative: 1.1 %
Eosinophils Absolute: 70 cells/uL (ref 15–500)
Eosinophils Relative: 2.5 %
HCT: 37.7 % (ref 35.0–45.0)
Hemoglobin: 12.4 g/dL (ref 11.7–15.5)
Lymphs Abs: 804 cells/uL — ABNORMAL LOW (ref 850–3900)
MCH: 26.8 pg — ABNORMAL LOW (ref 27.0–33.0)
MCHC: 32.9 g/dL (ref 32.0–36.0)
MCV: 81.4 fL (ref 80.0–100.0)
MPV: 11.4 fL (ref 7.5–12.5)
Monocytes Relative: 7.9 %
Neutro Abs: 1674 cells/uL (ref 1500–7800)
Neutrophils Relative %: 59.8 %
Platelets: 248 10*3/uL (ref 140–400)
RBC: 4.63 10*6/uL (ref 3.80–5.10)
RDW: 13.6 % (ref 11.0–15.0)
Total Lymphocyte: 28.7 %
WBC: 2.8 10*3/uL — ABNORMAL LOW (ref 3.8–10.8)

## 2019-12-21 LAB — TSH: TSH: 1.78 mIU/L (ref 0.40–4.50)

## 2019-12-21 LAB — FRUCTOSAMINE: Fructosamine: 274 umol/L (ref 205–285)

## 2019-12-21 LAB — MAGNESIUM: Magnesium: 2.2 mg/dL (ref 1.5–2.5)

## 2019-12-21 LAB — INSULIN, RANDOM: Insulin: 4.9 u[IU]/mL

## 2019-12-21 LAB — VITAMIN D 25 HYDROXY (VIT D DEFICIENCY, FRACTURES): Vit D, 25-Hydroxy: 58 ng/mL (ref 30–100)

## 2019-12-22 ENCOUNTER — Other Ambulatory Visit: Payer: Self-pay | Admitting: Internal Medicine

## 2019-12-22 DIAGNOSIS — K2100 Gastro-esophageal reflux disease with esophagitis, without bleeding: Secondary | ICD-10-CM

## 2019-12-30 ENCOUNTER — Ambulatory Visit: Payer: Medicare Other | Admitting: Orthopaedic Surgery

## 2020-01-18 ENCOUNTER — Other Ambulatory Visit: Payer: Self-pay

## 2020-01-18 ENCOUNTER — Ambulatory Visit: Payer: Medicare HMO | Attending: Internal Medicine

## 2020-01-18 DIAGNOSIS — Z23 Encounter for immunization: Secondary | ICD-10-CM | POA: Insufficient documentation

## 2020-01-18 NOTE — Progress Notes (Signed)
   Covid-19 Vaccination Clinic  Name:  Emily Jenkins    MRN: [id number] DOB: 29-Oct-1946  01/18/2020  [name] was observed post Covid-19 immunization for 15 minutes without incidence. She was provided with Vaccine Information Sheet and instruction to access the V-Safe system.   [name] was instructed to call 911 with any severe reactions post vaccine: Marland Kitchen Difficulty breathing  . Swelling of your face and throat  . A fast heartbeat  . A bad rash all over your body  . Dizziness and weakness    Immunizations Administered    Name Date Dose VIS Date Route   Moderna COVID-19 Vaccine 01/18/2020  1:46 PM 0.5 mL 11/11/2019 Intramuscular   Manufacturer: Moderna   Lot: [id number]   [id number]

## 2020-01-20 DIAGNOSIS — Z7722 Contact with and (suspected) exposure to environmental tobacco smoke (acute) (chronic): Secondary | ICD-10-CM | POA: Diagnosis not present

## 2020-01-20 DIAGNOSIS — K219 Gastro-esophageal reflux disease without esophagitis: Secondary | ICD-10-CM | POA: Diagnosis not present

## 2020-01-20 DIAGNOSIS — I10 Essential (primary) hypertension: Secondary | ICD-10-CM | POA: Diagnosis not present

## 2020-01-20 DIAGNOSIS — E785 Hyperlipidemia, unspecified: Secondary | ICD-10-CM | POA: Diagnosis not present

## 2020-01-20 DIAGNOSIS — Z7982 Long term (current) use of aspirin: Secondary | ICD-10-CM | POA: Diagnosis not present

## 2020-02-18 ENCOUNTER — Ambulatory Visit: Payer: Medicare HMO | Attending: Internal Medicine

## 2020-02-18 DIAGNOSIS — Z23 Encounter for immunization: Secondary | ICD-10-CM | POA: Insufficient documentation

## 2020-02-18 NOTE — Progress Notes (Signed)
   Covid-19 Vaccination Clinic  Name:  Emily Jenkins    MRN: [id number] DOB: 1946-12-07  02/18/2020  Emily Jenkins was observed post Covid-19 immunization for 15 minutes without incident. She was provided with Vaccine Information Sheet and instruction to access the V-Safe system.   Emily Jenkins was instructed to call 911 with any severe reactions post vaccine: Marland Kitchen Difficulty breathing  . Swelling of face and throat  . A fast heartbeat  . A bad rash all over body  . Dizziness and weakness   Immunizations Administered    Name Date Dose VIS Date Route   Moderna COVID-19 Vaccine 02/18/2020  1:29 PM 0.5 mL 11/11/2019 Intramuscular   Manufacturer: Moderna   Lot: [id number]   [id number]

## 2020-03-07 ENCOUNTER — Encounter: Payer: Self-pay | Admitting: Internal Medicine

## 2020-03-07 NOTE — Progress Notes (Signed)
Annual Screening/Preventative Visit & Comprehensive Evaluation &  Examination     This very nice 74 y.o. [name] presents for a Screening /Preventative Visit & comprehensive evaluation and management of multiple medical co-morbidities.  Patient has been followed for HTN, HLD, Prediabetes  and Vitamin D Deficiency. Patient's GERD is controlled on her meds.      HTN predates circa early 1990's. Patient's BP has been controlled at home and patient denies any cardiac symptoms as chest pain, palpitations, shortness of breath, dizziness or ankle swelling. Today's BP is at goal - 122/66.      Patient's hyperlipidemia is controlled with diet and Rosuvastatin. Patient denies myalgias or other medication SE's. Last lipids were at goal:  Lab Results  Component Value Date   CHOL 165 12/11/2019   HDL 68 12/11/2019   LDLCALC 84 12/11/2019   TRIG 52 12/11/2019   CHOLHDL 2.4 12/11/2019       Patient has hx/o prediabetes (A1c 5.9% / 2013)  and patient denies reactive hypoglycemic symptoms, visual blurring, diabetic polys or paresthesias. Last A1c was near goal:  Lab Results  Component Value Date   HGBA1C 5.7 (H) 09/04/2019       Finally, patient has history of Vitamin D Deficiency ("41" / 2014)  and last Vitamin D was not at goal (70-100):  Lab Results  Component Value Date   VD25OH 58 12/11/2019    Current Outpatient Medications on File Prior to Visit  Medication Sig  . aspirin 81 MG tablet Take 81 mg by mouth every evening.   . cholecalciferol (VITAMIN D) 1000 units tablet Take 5,000 Units by mouth daily.   . pantoprazole (PROTONIX) 40 MG tablet Take 1 tablet 2 x /day for Indigestion & Heartyburn  . polyethylene glycol (MIRALAX / GLYCOLAX) packet Take 17 g by mouth daily.   . rosuvastatin (CRESTOR) 5 MG tablet Take 1 tablet (5 mg total) by mouth daily.  Marland Kitchen telmisartan (MICARDIS) 80 MG tablet Take 1 tablet by mouth once daily  . vitamin C (ASCORBIC ACID) 500 MG tablet Take 500 mg by mouth  daily.  Marland Kitchen atenolol (TENORMIN) 50 MG tablet Take 1 tablet Daily at Suppertime for BP & Palpitations (Patient not taking: Reported on 03/08/2020)   No current facility-administered medications on file prior to visit.   Allergies  Allergen Reactions  . Escitalopram Itching  . Fosamax [Alendronate Sodium] Itching  . Levofloxacin     Headache, chest pain, muscle aches  . Sulfa Antibiotics Itching  . Tramadol Itching   Past Medical History:  Diagnosis Date  . DJD (degenerative joint disease)   . GERD (gastroesophageal reflux disease)   . Hyperlipidemia   . Hypertension   . Pre-diabetes   . RLS (restless legs syndrome)    Health Maintenance  Topic Date Due  . COLONOSCOPY  08/10/2020  . MAMMOGRAM  11/13/2020  . TETANUS/TDAP  07/11/2022  . INFLUENZA VACCINE  Completed  . DEXA SCAN  Completed  . Hepatitis C Screening  Completed  . PNA vac Low Risk Adult  Completed   Immunization History  Administered Date(s) Administered  . DTaP 12/11/2000  . Influenza Split 10/24/2013  . Influenza, High Dose Seasonal PF 09/17/2014, 09/09/2015, 08/21/2016, 09/21/2017, 10/14/2018, 10/13/2019  . Moderna SARS-COVID-2 Vaccination 01/18/2020, 02/18/2020  . Pneumococcal Conjugate-13 08/21/2016  . Pneumococcal Polysaccharide-23 12/11/2002, 01/08/2018  . Tdap 07/11/2012  . Zoster 08/06/2013    Last Colon - 08/10/2010 Dr Collene Mares - recc 10 yr f/u - due Sept 2012  Last [facility] -  12.04.2020  Past Surgical History:  Procedure Laterality Date  . ANKLE SURGERY    . CHOLECYSTECTOMY    . LEFT HEART CATHETERIZATION WITH CORONARY ANGIOGRAM N/A 07/29/2014   Procedure: LEFT HEART CATHETERIZATION WITH CORONARY ANGIOGRAM;  Surgeon: Burnell Blanks, MD;  Location: [facility] CATH LAB;  Service: Cardiovascular;  Laterality: N/A;  . TUBAL LIGATION     Family History  Problem Relation Age of Onset  . Heart disease Mother 76       stent  . Hypertension Mother   . Cancer Father   . Diabetes Father   . Hyperlipidemia  Sister   . Hypertension Sister    Social History   Tobacco Use  . Smoking status: Never Smoker  . Smokeless tobacco: Never Used  . Tobacco comment: SPOUSE SMOKES  Substance Use Topics  . Alcohol use: No    Alcohol/week: 0.0 standard drinks  . Drug use: No    ROS Constitutional: Denies fever, chills, weight loss/gain, headaches, insomnia,  night sweats, and change in appetite. Does c/o fatigue. Eyes: Denies redness, blurred vision, diplopia, discharge, itchy, watery eyes.  ENT: Denies discharge, congestion, post nasal drip, epistaxis, sore throat, earache, hearing loss, dental pain, Tinnitus, Vertigo, Sinus pain, snoring.  Cardio: Denies chest pain, palpitations, irregular heartbeat, syncope, dyspnea, diaphoresis, orthopnea, PND, claudication, edema Respiratory: denies cough, dyspnea, DOE, pleurisy, hoarseness, laryngitis, wheezing.  Gastrointestinal: Denies dysphagia, heartburn, reflux, water brash, pain, cramps, nausea, vomiting, bloating, diarrhea, constipation, hematemesis, melena, hematochezia, jaundice, hemorrhoids Genitourinary: Denies dysuria, frequency, urgency, nocturia, hesitancy, discharge, hematuria, flank pain Breast: Breast lumps, nipple discharge, bleeding.  Musculoskeletal: Denies arthralgia, myalgia, stiffness, Jt. Swelling, pain, limp, and strain/sprain. Denies falls. Skin: Denies puritis, rash, hives, warts, acne, eczema, changing in skin lesion Neuro: No weakness, tremor, incoordination, spasms, paresthesia, pain Psychiatric: Denies confusion, memory loss, sensory loss. Denies Depression. Endocrine: Denies change in weight, skin, hair change, nocturia, and paresthesia, diabetic polys, visual blurring, hyper / hypo glycemic episodes.  Heme/Lymph: No excessive bleeding, bruising, enlarged lymph nodes.  Physical Exam  BP 122/66   Pulse 88   Temp (!) 97 F (36.1 C)   Resp 16   Ht 5' 5.5" (1.664 m)   Wt 156 lb 3.2 oz (70.9 kg)   BMI 25.60 kg/m   General  Appearance: Well nourished, well groomed and in no apparent distress.  Eyes: PERRLA, EOMs, conjunctiva no swelling or erythema, normal fundi and vessels. Sinuses: No frontal/maxillary tenderness ENT/Mouth: EACs patent / TMs  nl. Nares clear without erythema, swelling, mucoid exudates. Oral hygiene is good. No erythema, swelling, or exudate. Tongue normal, non-obstructing. Tonsils not swollen or erythematous. Hearing normal.  Neck: Supple, thyroid not palpable. No bruits, nodes or JVD. Respiratory: Respiratory effort normal.  BS equal and clear bilateral without rales, rhonci, wheezing or stridor. Cardio: Heart sounds are normal with regular rate and rhythm and no murmurs, rubs or gallops. Peripheral pulses are normal and equal bilaterally without edema. No aortic or femoral bruits. Chest: symmetric with normal excursions and percussion. Breasts: Symmetric, without lumps, nipple discharge, retractions, or fibrocystic changes.  Abdomen: Flat, soft with bowel sounds active. Nontender, no guarding, rebound, hernias, masses, or organomegaly.  Lymphatics: Non tender without lymphadenopathy.  Genitourinary:  Musculoskeletal: Full ROM all peripheral extremities, joint stability, 5/5 strength, and normal gait. Skin: Warm and dry without rashes, lesions, cyanosis, clubbing or  ecchymosis.  Neuro: Cranial nerves intact, reflexes equal bilaterally. Normal muscle tone, no cerebellar symptoms. Sensation intact.  Pysch: Alert and oriented X 3, normal affect,  Insight and Judgment appropriate.   Assessment and Plan  1. Annual Preventative Screening Examination  2. Essential hypertension  - EKG 12-Lead - Urinalysis, Routine w reflex microscopic - Microalbumin / creatinine urine ratio - CBC with Differential/Platelet - COMPLETE METABOLIC PANEL WITH GFR - Magnesium - TSH  3. Hyperlipidemia, mixed  - EKG 12-Lead - Lipid panel - TSH  4. Abnormal glucose  - EKG 12-Lead - Hemoglobin A1c - Insulin,  random  5. Vitamin D deficiency  - VITAMIN D 25 Hydroxy   6. Prediabetes  - EKG 12-Lead - Hemoglobin A1c - Insulin, random  7. Screening for colorectal cancer  - POC Hemoccult Bld/Stl  8. Screening for ischemic heart disease  - EKG 12-Lead  9. FHx: heart disease  - EKG 12-Lead  10. Medication management  - Urinalysis, Routine w reflex microscopic - Microalbumin / creatinine urine ratio - CBC with Differential/Platelet - COMPLETE METABOLIC PANEL WITH GFR - Magnesium - Lipid panel - TSH - Hemoglobin A1c - Insulin, random - VITAMIN D 25 Hydroxy        Patient was counseled in prudent diet to achieve/maintain BMI less than 25 for weight control, BP monitoring, regular exercise and medications. Discussed med's effects and SE's. Screening labs and tests as requested with regular follow-up as recommended. Over 40 minutes of exam, counseling, chart review and high complex critical decision making was performed.   Kirtland Bouchard, MD

## 2020-03-07 NOTE — Patient Instructions (Signed)

## 2020-03-08 ENCOUNTER — Other Ambulatory Visit: Payer: Self-pay

## 2020-03-08 ENCOUNTER — Ambulatory Visit (INDEPENDENT_AMBULATORY_CARE_PROVIDER_SITE_OTHER): Payer: Medicare HMO | Admitting: Internal Medicine

## 2020-03-08 VITALS — BP 122/66 | HR 88 | Temp 97.0°F | Resp 16 | Ht 65.5 in | Wt 156.2 lb

## 2020-03-08 DIAGNOSIS — R7303 Prediabetes: Secondary | ICD-10-CM | POA: Diagnosis not present

## 2020-03-08 DIAGNOSIS — Z0000 Encounter for general adult medical examination without abnormal findings: Secondary | ICD-10-CM | POA: Diagnosis not present

## 2020-03-08 DIAGNOSIS — Z0001 Encounter for general adult medical examination with abnormal findings: Secondary | ICD-10-CM

## 2020-03-08 DIAGNOSIS — Z79899 Other long term (current) drug therapy: Secondary | ICD-10-CM

## 2020-03-08 DIAGNOSIS — Z136 Encounter for screening for cardiovascular disorders: Secondary | ICD-10-CM

## 2020-03-08 DIAGNOSIS — Z1211 Encounter for screening for malignant neoplasm of colon: Secondary | ICD-10-CM

## 2020-03-08 DIAGNOSIS — Z8249 Family history of ischemic heart disease and other diseases of the circulatory system: Secondary | ICD-10-CM

## 2020-03-08 DIAGNOSIS — R7309 Other abnormal glucose: Secondary | ICD-10-CM | POA: Diagnosis not present

## 2020-03-08 DIAGNOSIS — E559 Vitamin D deficiency, unspecified: Secondary | ICD-10-CM

## 2020-03-08 DIAGNOSIS — E782 Mixed hyperlipidemia: Secondary | ICD-10-CM

## 2020-03-08 DIAGNOSIS — I10 Essential (primary) hypertension: Secondary | ICD-10-CM

## 2020-03-09 LAB — LIPID PANEL
Cholesterol: 160 mg/dL (ref <200)
HDL: 61 mg/dL (ref >=50)
LDL Cholesterol (Calc): 83 mg/dL (calc)
Non-HDL Cholesterol (Calc): 99 mg/dL (calc) (ref <130)
Total CHOL/HDL Ratio: 2.6 (calc) (ref <5.0)
Triglycerides: 81 mg/dL (ref <150)

## 2020-03-09 LAB — COMPLETE METABOLIC PANEL WITH GFR
AG Ratio: 1.6 (calc) (ref 1.0–2.5)
ALT: 9 U/L (ref 6–29)
AST: 13 U/L (ref 10–35)
Albumin: 4.2 g/dL (ref 3.6–5.1)
Alkaline phosphatase (APISO): 78 U/L (ref 37–153)
BUN: 12 mg/dL (ref 7–25)
CO2: 30 mmol/L (ref 20–32)
Calcium: 9.9 mg/dL (ref 8.6–10.4)
Chloride: 105 mmol/L (ref 98–110)
Creat: 0.63 mg/dL (ref 0.60–0.93)
GFR, Est African American: 102 mL/min/{1.73_m2} (ref >=60)
GFR, Est Non African American: 88 mL/min/{1.73_m2} (ref >=60)
Globulin: 2.7 g/dL (calc) (ref 1.9–3.7)
Glucose, Bld: 135 mg/dL — ABNORMAL HIGH (ref 65–99)
Potassium: 4 mmol/L (ref 3.5–5.3)
Sodium: 142 mmol/L (ref 135–146)
Total Bilirubin: 0.5 mg/dL (ref 0.2–1.2)
Total Protein: 6.9 g/dL (ref 6.1–8.1)

## 2020-03-09 LAB — CBC WITH DIFFERENTIAL/PLATELET
Absolute Monocytes: 198 cells/uL — ABNORMAL LOW (ref 200–950)
Basophils Absolute: 40 cells/uL (ref 0–200)
Basophils Relative: 1.2 %
Eosinophils Absolute: 89 cells/uL (ref 15–500)
Eosinophils Relative: 2.7 %
HCT: 39.9 % (ref 35.0–45.0)
Hemoglobin: 12.7 g/dL (ref 11.7–15.5)
Lymphs Abs: 792 cells/uL — ABNORMAL LOW (ref 850–3900)
MCH: 26.6 pg — ABNORMAL LOW (ref 27.0–33.0)
MCHC: 31.8 g/dL — ABNORMAL LOW (ref 32.0–36.0)
MCV: 83.5 fL (ref 80.0–100.0)
MPV: 11.4 fL (ref 7.5–12.5)
Monocytes Relative: 6 %
Neutro Abs: 2181 cells/uL (ref 1500–7800)
Neutrophils Relative %: 66.1 %
Platelets: 251 10*3/uL (ref 140–400)
RBC: 4.78 10*6/uL (ref 3.80–5.10)
RDW: 13.8 % (ref 11.0–15.0)
Total Lymphocyte: 24 %
WBC: 3.3 10*3/uL — ABNORMAL LOW (ref 3.8–10.8)

## 2020-03-09 LAB — MAGNESIUM: Magnesium: 1.9 mg/dL (ref 1.5–2.5)

## 2020-03-09 LAB — URINALYSIS, ROUTINE W REFLEX MICROSCOPIC
Bilirubin Urine: NEGATIVE
Glucose, UA: NEGATIVE
Hgb urine dipstick: NEGATIVE
Ketones, ur: NEGATIVE
Leukocytes,Ua: NEGATIVE
Nitrite: NEGATIVE
Protein, ur: NEGATIVE
Specific Gravity, Urine: 1.017 (ref 1.001–1.03)
pH: <=5 (ref 5.0–8.0)

## 2020-03-09 LAB — INSULIN, RANDOM: Insulin: 22.8 u[IU]/mL — ABNORMAL HIGH

## 2020-03-09 LAB — HEMOGLOBIN A1C
Hgb A1c MFr Bld: 5.7 % of total Hgb — ABNORMAL HIGH (ref <5.7)
Mean Plasma Glucose: 117 (calc)
eAG (mmol/L): 6.5 (calc)

## 2020-03-09 LAB — MICROALBUMIN / CREATININE URINE RATIO
Creatinine, Urine: 98 mg/dL (ref 20–275)
Microalb Creat Ratio: 20 mcg/mg creat (ref <30)
Microalb, Ur: 2 mg/dL

## 2020-03-09 LAB — VITAMIN D 25 HYDROXY (VIT D DEFICIENCY, FRACTURES): Vit D, 25-Hydroxy: 66 ng/mL (ref 30–100)

## 2020-03-09 LAB — TSH: TSH: 1.5 mIU/L (ref 0.40–4.50)

## 2020-03-11 ENCOUNTER — Other Ambulatory Visit: Payer: Self-pay | Admitting: Adult Health

## 2020-03-11 DIAGNOSIS — I10 Essential (primary) hypertension: Secondary | ICD-10-CM

## 2020-03-11 DIAGNOSIS — R519 Headache, unspecified: Secondary | ICD-10-CM

## 2020-04-09 ENCOUNTER — Ambulatory Visit (INDEPENDENT_AMBULATORY_CARE_PROVIDER_SITE_OTHER): Payer: Medicare HMO | Admitting: Physician Assistant

## 2020-04-09 ENCOUNTER — Ambulatory Visit: Payer: Medicare HMO | Admitting: Adult Health

## 2020-04-09 ENCOUNTER — Other Ambulatory Visit: Payer: Self-pay

## 2020-04-09 ENCOUNTER — Encounter: Payer: Self-pay | Admitting: Physician Assistant

## 2020-04-09 VITALS — BP 136/80 | HR 81 | Temp 97.5°F | Wt 154.0 lb

## 2020-04-09 DIAGNOSIS — E611 Iron deficiency: Secondary | ICD-10-CM

## 2020-04-09 DIAGNOSIS — R079 Chest pain, unspecified: Secondary | ICD-10-CM

## 2020-04-09 DIAGNOSIS — H04123 Dry eye syndrome of bilateral lacrimal glands: Secondary | ICD-10-CM | POA: Diagnosis not present

## 2020-04-09 DIAGNOSIS — R110 Nausea: Secondary | ICD-10-CM | POA: Diagnosis not present

## 2020-04-09 DIAGNOSIS — R61 Generalized hyperhidrosis: Secondary | ICD-10-CM

## 2020-04-09 DIAGNOSIS — R350 Frequency of micturition: Secondary | ICD-10-CM | POA: Diagnosis not present

## 2020-04-09 DIAGNOSIS — M2550 Pain in unspecified joint: Secondary | ICD-10-CM | POA: Diagnosis not present

## 2020-04-09 NOTE — Patient Instructions (Addendum)
Okay to take tylenol  Go to the ER if any chest pain, shortness of breath, nausea, dizziness, severe HA, changes vision/speech,  severe AB pain, unable to hold down food/water, blood in stool or vomit, chest pain, shortness of breath, or any worsening symptoms.   Follow up with Dr. Collene Mares, you may be over due for colonoscopy- Phone: [phone];   Take the protonix twice a day  INFORMATION ABOUT YOUR XRAY  Can walk into [address] for an Insurance account manager. They will have the order and take you back. You do not any paper work, I should get the result back today or tomorrow. This order is good for a year.  Can call [phone] to schedule an appointment if you wish.    Peptic Ulcer  A peptic ulcer is a sore in the lining of the stomach (gastric ulcer) or the first part of the small intestine (duodenal ulcer). The ulcer causes a gradual wearing away (erosion) of the deeper tissue. What are the causes? Normally, the lining of the stomach and the small intestine protects them from the acid that digests food. The protective lining can be damaged by:  An infection caused by a type of bacteria called Helicobacter pylori or H. pylori.  Regular use of NSAIDs, such as ibuprofen or aspirin.  Rare tumors in the stomach, small intestine, or pancreas (Zollinger-Ellison syndrome). What increases the risk? The following factors may make you more likely to develop this condition:  Smoking.  Having a family history of ulcer disease.  Drinking alcohol.  Having been hospitalized in an intensive care unit (ICU). What are the signs or symptoms? Symptoms of this condition include:  Persistent burning pain in the area between the chest and the belly button. The pain may be worse on an empty stomach and at night.  Heartburn.  Nausea and vomiting.  Bloating. If the ulcer results in bleeding, it can cause:  Black, tarry stools.  Vomiting of bright red blood.  Vomiting of material that looks  like coffee grounds. How is this diagnosed? This condition may be diagnosed based on:  Your medical history and a physical exam.  Various tests or procedures, such as: ? Blood tests, stool tests, or breath tests to check for the H. pylori bacteria. ? An X-ray exam (upper gastrointestinal series) of the esophagus, stomach, and small intestine. ? Upper endoscopy. The health care provider examines the esophagus, stomach, and small intestine using a small flexible tube that has a video camera at the end. ? Biopsy. A tissue sample is removed to be examined under a microscope. How is this treated? Treatment for this condition may include:  Eliminating the cause of the ulcer, such as smoking or use of NSAIDs, and limiting alcohol and caffeine intake.  Medicines to reduce the amount of acid in your digestive tract.  Antibiotic medicines, if the ulcer is caused by an H. pylori infection.  An upper endoscopy may be used to treat a bleeding ulcer.  Surgery. This may be needed if the bleeding is severe or if the ulcer created a hole somewhere in the digestive system. Follow these instructions at home:  Do not drink alcohol if your health care provider tells you not to drink.  Do not use any products that contain nicotine or tobacco, such as cigarettes, e-cigarettes, and chewing tobacco. If you need help quitting, ask your health care provider.  Take over-the-counter and prescription medicines only as told by your health care provider. ? Do not use over-the-counter  medicines in place of prescription medicines unless your health care provider approves. ? Do not take aspirin, ibuprofen, or other NSAIDs unless your health care provider told you to do so.  Take over-the-counter and prescription medicines only as told by your health care provider.  Keep all follow-up visits as told by your health care provider. This is important. Contact a health care provider if:  Your symptoms do not improve  within 7 days of starting treatment.  You have ongoing indigestion or heartburn. Get help right away if:  You have sudden, sharp, or persistent pain in your abdomen.  You have bloody or dark black, tarry stools.  You vomit blood or material that looks like coffee grounds.  You become light-headed or you feel faint.  You become weak.  You become sweaty or clammy. Summary  A peptic ulcer is a sore in the lining of the stomach (gastric ulcer) or the first part of the small intestine (duodenal ulcer). The ulcer causes a gradual wearing away (erosion) of the deeper tissue.  Do not use any products that contain nicotine or tobacco, such as cigarettes, e-cigarettes, and chewing tobacco. If you need help quitting, ask your health care provider.  Take over-the-counter and prescription medicines only as told by your health care provider. Do not use over-the-counter medicines in place of prescription medicines unless your health care provider approves.  Contact your health care provider if you have ongoing indigestion or heartburn.  Keep all follow-up visits as told by your health care provider. This is important. This information is not intended to replace advice given to you by your health care provider. Make sure you discuss any questions you have with your health care provider. Document Revised: 06/04/2018 Document Reviewed: 06/04/2018 Elsevier Patient Education  [place].

## 2020-04-09 NOTE — Progress Notes (Signed)
Subjective:    Patient ID: Emily Jenkins, female    DOB: May 30, 1946, 74 y.o.   MRN: [id number]  HPI 74 y.o. AAF with HTN, chol, GERD presents with weakness and night sweats for 1 month or 2. She will have to get up and change her gown she will sweat so much.    She states she had chest pain all over but mainly substernal, worse with deep breath and movement, she had not had breakfast, she was brushing her teeth at the time. No accompaniments like nausea, sweating, dizziness. Had normal cardia ath 2015.   She took tums and it helped. She has no personal history of blood clots but her son has blood clots and she has had bilateral swelling in her legs.  She is having some sweating and SOB, non exertional, has some now.  She keeps a dry mouth constantly.  She has had both covid vaccines and no exposure to covid.   She is very stressed, her husband passed 4 years ago, and her husband kids are bothering her about taxes or payments.   BMI is Body mass index is 25.24 kg/m., she has had 11 lbs weight loss since dec but states she has been trying to walk more and not eat before bed. She is UTD on her MGM, she is overdue for CXR and colonoscopy Wt Readings from Last 3 Encounters:  04/09/20 154 lb (69.9 kg)  03/08/20 156 lb 3.2 oz (70.9 kg)  12/11/19 165 lb 6.4 oz (75 kg)   She had a abnormal stress test 2015 but had cath Dr. Percival Spanish normal 07/2014 Left main: No obstructive disease.   Left Anterior Descending Artery: Large caliber vessel that courses to the apex. There are several small caliber diagonal branches. No obstructive disease.    Circumflex Artery: Large caliber vessel that terminates into a large obtuse marginal branch. No obstructive disease.   Right Coronary Artery: Large dominant vessel with no obstructive disease.   Left Ventricular Angiogram: LVEF=65%.  MGM 11/2019 normal  Had EGD 03/2017- normal , small HH, Dr. Collene Mares Colonoscopy 07/2010- hyperplastic polyp, Dr.  Collene Mares CXR 09/2017 she has never smoked but has been around 2nd hand smoke CT AB 2017 IMPRESSION: Moderate constipation. Negative for bowel obstruction. Normal appendix  Negative for urinary tract calculi.   Blood pressure 136/80, pulse 81, temperature (!) 97.5 F (36.4 C), weight 154 lb (69.9 kg), SpO2 98 %.   Review of Systems  Constitutional: Positive for diaphoresis, fatigue and unexpected weight change. Negative for activity change, appetite change, chills and fever.  HENT: Positive for postnasal drip and rhinorrhea. Negative for sore throat, tinnitus and trouble swallowing.   Eyes: Positive for itching and visual disturbance. Negative for photophobia, pain, discharge and redness.  Respiratory: Positive for shortness of breath. Negative for apnea, cough, choking, chest tightness, wheezing and stridor.   Cardiovascular: Positive for chest pain and leg swelling. Negative for palpitations.  Gastrointestinal: Positive for constipation. Negative for abdominal distention, abdominal pain, anal bleeding, blood in stool, diarrhea, nausea, rectal pain and vomiting.  Genitourinary: Negative.   Musculoskeletal: Positive for arthralgias and back pain. Negative for gait problem, joint swelling, myalgias, neck pain and neck stiffness.  Skin: Negative.  Negative for rash.  Neurological: Negative.  Negative for tremors.  Hematological: Negative.   Psychiatric/Behavioral: Negative.        Objective:   Physical Exam General appearance: alert, no distress, WD/WN, female HEENT: normocephalic, sclerae anicteric, TMs pearly, nares patent, no discharge or erythema,  pharynx normal Oral cavity: MMM, no lesions Neck: supple, no lymphadenopathy, no thyromegaly, no masses Heart: RRR, normal S1, S2, no murmurs Lungs: CTA bilaterally, no wheezes, rhonchi, or rales Abdomen: +bs, soft, + epigastric tenderness, non distended, no masses, no hepatomegaly, no splenomegaly Musculoskeletal: nontender, no  swelling, no obvious deformity Extremities: no edema, no cyanosis, no clubbing Pulses: 2+ symmetric, upper and lower extremities, normal cap refill Neurological: alert, oriented x 3, CN2-12 intact, strength normal upper extremities and lower extremities, sensation normal throughout, DTRs 2+ throughout, no cerebellar signs, gait antalgic Psychiatric: normal affect, behavior normal, pleasant        Assessment & Plan:   Chest pain, unspecified type -     EKG 12-Lead -     DG Chest 2 View; Future -     D-dimer, quantitative (not at [facility]) Low risk blood clot but with pain with deep breath, SOB, fatigue and son having history will check ddimer EKG normal- had completely normal cath 2015, low risk CAD ? GERD- get on protonix twice daily and check pancreatic enzymes with nausea and discomfort  Night sweats with weight loss Possibly intentional weight loss but with drenching night sweats, will rule out CA- normal MGM, get CXR/overdue colonoscopy Check labs -     CBC with Differential/Platelet -     COMPLETE METABOLIC PANEL WITH GFR -     TSH  Urinary frequency -     Urinalysis, Routine w reflex microscopic -     Urine Culture  Iron deficiency -     Iron,Total/Total Iron Binding Cap  Polyarthralgia with dry eyes Rule out autoimmune -     Sedimentation rate -     Anti-DNA antibody, double-stranded -     Angiotensin converting enzyme -     ANCA screen with reflex titer -     Sjogrens syndrome-A extractable nuclear antibody -     Sjogrens syndrome-B extractable nuclear antibody    The patient was advised to call immediately if she has any concerning symptoms in the interval. The patient voices understanding of current treatment options and is in agreement with the current care plan.The patient knows to call the clinic with any problems, questions or concerns or go to the ER if any further progression of symptoms.

## 2020-04-12 LAB — IRON, TOTAL/TOTAL IRON BINDING CAP
%SAT: 25 % (calc) (ref 16–45)
Iron: 80 ug/dL (ref 45–160)
TIBC: 326 mcg/dL (calc) (ref 250–450)

## 2020-04-12 LAB — ANGIOTENSIN CONVERTING ENZYME: Angiotensin-Converting Enzyme: 25 U/L (ref 9–67)

## 2020-04-12 LAB — CBC WITH DIFFERENTIAL/PLATELET
Absolute Monocytes: 202 cells/uL (ref 200–950)
Basophils Absolute: 31 cells/uL (ref 0–200)
Basophils Relative: 1 %
Eosinophils Absolute: 81 cells/uL (ref 15–500)
Eosinophils Relative: 2.6 %
HCT: 39.9 % (ref 35.0–45.0)
Hemoglobin: 13 g/dL (ref 11.7–15.5)
Lymphs Abs: 822 cells/uL — ABNORMAL LOW (ref 850–3900)
MCH: 26.9 pg — ABNORMAL LOW (ref 27.0–33.0)
MCHC: 32.6 g/dL (ref 32.0–36.0)
MCV: 82.4 fL (ref 80.0–100.0)
MPV: 11.8 fL (ref 7.5–12.5)
Monocytes Relative: 6.5 %
Neutro Abs: 1965 cells/uL (ref 1500–7800)
Neutrophils Relative %: 63.4 %
Platelets: 233 10*3/uL (ref 140–400)
RBC: 4.84 10*6/uL (ref 3.80–5.10)
RDW: 14.3 % (ref 11.0–15.0)
Total Lymphocyte: 26.5 %
WBC: 3.1 10*3/uL — ABNORMAL LOW (ref 3.8–10.8)

## 2020-04-12 LAB — URINE CULTURE
MICRO NUMBER:: 10427097
SPECIMEN QUALITY:: ADEQUATE

## 2020-04-12 LAB — COMPLETE METABOLIC PANEL WITH GFR
AG Ratio: 2 (calc) (ref 1.0–2.5)
ALT: 8 U/L (ref 6–29)
AST: 15 U/L (ref 10–35)
Albumin: 4.5 g/dL (ref 3.6–5.1)
Alkaline phosphatase (APISO): 80 U/L (ref 37–153)
BUN/Creatinine Ratio: 12 (calc) (ref 6–22)
BUN: 11 mg/dL (ref 7–25)
CO2: 28 mmol/L (ref 20–32)
Calcium: 10.1 mg/dL (ref 8.6–10.4)
Chloride: 109 mmol/L (ref 98–110)
Creat: 0.95 mg/dL — ABNORMAL HIGH (ref 0.60–0.93)
GFR, Est African American: 68 mL/min/{1.73_m2} (ref >=60)
GFR, Est Non African American: 59 mL/min/{1.73_m2} — ABNORMAL LOW (ref >=60)
Globulin: 2.3 g/dL (calc) (ref 1.9–3.7)
Glucose, Bld: 108 mg/dL — ABNORMAL HIGH (ref 65–99)
Potassium: 4.3 mmol/L (ref 3.5–5.3)
Sodium: 145 mmol/L (ref 135–146)
Total Bilirubin: 0.5 mg/dL (ref 0.2–1.2)
Total Protein: 6.8 g/dL (ref 6.1–8.1)

## 2020-04-12 LAB — URINALYSIS, ROUTINE W REFLEX MICROSCOPIC
Bilirubin Urine: NEGATIVE
Glucose, UA: NEGATIVE
Hgb urine dipstick: NEGATIVE
Ketones, ur: NEGATIVE
Leukocytes,Ua: NEGATIVE
Nitrite: NEGATIVE
Protein, ur: NEGATIVE
Specific Gravity, Urine: 1.018 (ref 1.001–1.03)
pH: <=5 (ref 5.0–8.0)

## 2020-04-12 LAB — SJOGRENS SYNDROME-B EXTRACTABLE NUCLEAR ANTIBODY: SSB (La) (ENA) Antibody, IgG: <1 AI

## 2020-04-12 LAB — LIPASE: Lipase: 25 U/L (ref 7–60)

## 2020-04-12 LAB — AMYLASE: Amylase: 49 U/L (ref 21–101)

## 2020-04-12 LAB — D-DIMER, QUANTITATIVE: D-Dimer, Quant: 0.47 mcg/mL FEU (ref <0.50)

## 2020-04-12 LAB — SJOGRENS SYNDROME-A EXTRACTABLE NUCLEAR ANTIBODY: SSA (Ro) (ENA) Antibody, IgG: <1 AI

## 2020-04-12 LAB — TSH: TSH: 1.73 mIU/L (ref 0.40–4.50)

## 2020-04-12 LAB — ANCA SCREEN W REFLEX TITER: ANCA Screen: NEGATIVE

## 2020-04-12 LAB — ANTI-DNA ANTIBODY, DOUBLE-STRANDED: ds DNA Ab: 1 IU/mL

## 2020-04-12 LAB — SEDIMENTATION RATE: Sed Rate: 28 mm/h (ref 0–30)

## 2020-04-19 ENCOUNTER — Other Ambulatory Visit: Payer: Self-pay

## 2020-04-19 ENCOUNTER — Ambulatory Visit
Admission: RE | Admit: 2020-04-19 | Discharge: 2020-04-19 | Disposition: A | Discharge status: Home / Self Care | Payer: Medicare HMO | Source: Ambulatory Visit | Attending: Physician Assistant | Admitting: Physician Assistant

## 2020-04-19 DIAGNOSIS — R079 Chest pain, unspecified: Secondary | ICD-10-CM

## 2020-04-19 DIAGNOSIS — R0602 Shortness of breath: Secondary | ICD-10-CM | POA: Diagnosis not present

## 2020-05-07 ENCOUNTER — Telehealth: Payer: Self-pay | Admitting: Radiology

## 2020-05-07 DIAGNOSIS — S92302A Fracture of unspecified metatarsal bone(s), left foot, initial encounter for closed fracture: Secondary | ICD-10-CM | POA: Diagnosis not present

## 2020-05-07 NOTE — Telephone Encounter (Signed)
Patient twisted left foot, went to PCP stated "foot fracture on side", patient states she was given a boot to wear. Patient on Dr. Lorin Mercy schedule 6/9 am.  Advise I would sent message, if sooner appt can be worked in Dr. Lorin Mercy assistant will call.

## 2020-05-17 NOTE — Telephone Encounter (Signed)
No earlier appt available.

## 2020-05-19 ENCOUNTER — Encounter: Payer: Self-pay | Admitting: Orthopaedic Surgery

## 2020-05-19 ENCOUNTER — Ambulatory Visit (INDEPENDENT_AMBULATORY_CARE_PROVIDER_SITE_OTHER): Payer: Medicare HMO

## 2020-05-19 ENCOUNTER — Ambulatory Visit: Payer: Medicare HMO | Admitting: Orthopaedic Surgery

## 2020-05-19 VITALS — BP 154/82 | HR 90 | Ht 65.0 in | Wt 155.0 lb

## 2020-05-19 DIAGNOSIS — S92353A Displaced fracture of fifth metatarsal bone, unspecified foot, initial encounter for closed fracture: Secondary | ICD-10-CM | POA: Insufficient documentation

## 2020-05-19 DIAGNOSIS — S92355A Nondisplaced fracture of fifth metatarsal bone, left foot, initial encounter for closed fracture: Secondary | ICD-10-CM

## 2020-05-19 DIAGNOSIS — M79672 Pain in left foot: Secondary | ICD-10-CM | POA: Diagnosis not present

## 2020-05-19 NOTE — Progress Notes (Signed)
Office Visit Note   Patient: Emily Jenkins           Date of Birth: 12-29-1945           MRN: [id number] Visit Date: 05/19/2020              Requested by: Unk Pinto, [address] [place],  [place] [address] PCP: Unk Pinto, MD   Assessment & Plan: Visit Diagnoses:  1. Pain in left foot   2. Closed nondisplaced fracture of fifth metatarsal bone of left foot, initial encounter     Plan: X-rays reviewed she can work wear the cam boot starting next week with her tennis shoe.  Weightbearing as tolerated.  We discussed care in the next few weeks to avoid reinjury.  If it slippery or wet she will wear a cam boot for the next few weeks if she goes out.  She will call and return if she has further problems.  Follow-Up Instructions: No follow-ups on file.   Orders:  Orders Placed This Encounter  Procedures  . XR Foot Complete Left   No orders of the defined types were placed in this encounter.     Procedures: No procedures performed   Clinical Data: No additional findings.   Subjective: Chief Complaint  Patient presents with  . Left Foot - Fracture    DOI 05/07/2020    HPI 74 year old female injured her left foot she is taking out the garbage for an elderly friend and was injured on 04/2819 when she rolled her ankle suffering a left proximal fifth metatarsal nondisplaced fracture.  She had previous bimalleolar ankle fracture fixed by me several years ago which had healed successfully.  Patient a prescription for hydrocodone was placed in a cam boot.  She still had some swelling she has been trying to keep her foot elevated.  Review of Systems 14 point systems all other are negative as pertains HPI.   Objective: Vital Signs: BP (!) 154/82   Pulse 90   Ht 5\' 5"  (1.651 m)   Wt 155 lb (70.3 kg)   BMI 25.79 kg/m   Physical Exam Constitutional:      Appearance: She is well-developed.  HENT:     Head: Normocephalic.     Right Ear:  External ear normal.     Left Ear: External ear normal.  Eyes:     Pupils: Pupils are equal, round, and reactive to light.  Neck:     Thyroid: No thyromegaly.     Trachea: No tracheal deviation.  Cardiovascular:     Rate and Rhythm: Normal rate.  Pulmonary:     Effort: Pulmonary effort is normal.  Abdominal:     Palpations: Abdomen is soft.  Skin:    General: Skin is warm and dry.  Neurological:     Mental Status: She is alert and oriented to person, place, and time.  Psychiatric:        Behavior: Behavior normal.     Ortho Exam mild swelling ecchymosis lateral proximal metatarsal.  This is a closed injury.  Cam boot fit is good.  Specialty Comments:  No specialty comments available.  Imaging: No results found.   PMFS History: Patient Active Problem List   Diagnosis Date Noted  . Fracture of 5th metatarsal 05/19/2020  . Bilateral primary osteoarthritis of knee 08/20/2019  . Arthritis of right knee 11/11/2018  . Overweight (BMI 25.0-29.9) 10/29/2015  . Osteoporosis 06/07/2015  . GERD  03/04/2015  .  Medication management 01/05/2014  . Essential hypertension 12/02/2013  . Mixed hyperlipidemia 12/02/2013  . Other abnormal glucose (prediabetes) 12/02/2013  . Vitamin D deficiency 12/02/2013  . Varicose veins of both lower extremities 09/16/2012   Past Medical History:  Diagnosis Date  . DJD (degenerative joint disease)   . GERD (gastroesophageal reflux disease)   . Hyperlipidemia   . Hypertension   . Pre-diabetes   . RLS (restless legs syndrome)     Family History  Problem Relation Age of Onset  . Heart disease Mother 70       stent  . Hypertension Mother   . Cancer Father   . Diabetes Father   . Hyperlipidemia Sister   . Hypertension Sister     Past Surgical History:  Procedure Laterality Date  . ANKLE SURGERY    . CHOLECYSTECTOMY    . LEFT HEART CATHETERIZATION WITH CORONARY ANGIOGRAM N/A 07/29/2014   Procedure: LEFT HEART CATHETERIZATION WITH  CORONARY ANGIOGRAM;  Surgeon: Burnell Blanks, MD;  Location: [facility] CATH LAB;  Service: Cardiovascular;  Laterality: N/A;  . TUBAL LIGATION     Social History   Occupational History  . Not on file  Tobacco Use  . Smoking status: Never Smoker  . Smokeless tobacco: Never Used  . Tobacco comment: SPOUSE SMOKES  Substance and Sexual Activity  . Alcohol use: No    Alcohol/week: 0.0 standard drinks  . Drug use: No  . Sexual activity: Yes

## 2020-06-08 ENCOUNTER — Ambulatory Visit: Payer: Medicare Other | Admitting: Adult Health

## 2020-06-21 ENCOUNTER — Encounter: Payer: Self-pay | Admitting: Adult Health

## 2020-06-21 NOTE — Progress Notes (Signed)
MEDICARE ANNUAL WELLNESS VISIT AND FOLLOW UP  Assessment:   Diagnoses and all orders for this visit:  Encounter for Medicare annual wellness exam  Varicose veins of both lower extremities, unspecified whether complicated - weight loss discussed, continue compression stockings and elevation  Essential hypertension Continue medication; single elevation today, typically well controlled and home values at goal - defer med adjustment  Monitor blood pressure at home; call if consistently over 130/80 Continue DASH diet.   Reminder to go to the ER if any CP, SOB, nausea, dizziness, severe HA, changes vision/speech, left arm numbness and tingling and jaw pain.  Gastroesophageal reflux disease, esophagitis presence not specified Well managed on current medications Discussed diet, avoiding triggers and other lifestyle changes  Age-related osteoporosis without current pathological fracture - get DEXA 11/2020, didn't tolerate fosamax, but R hip T improved to osteopenia last check, aggressive lifestyle recommendations, consider boniva/actonel continue Vit D and Ca, weight bearing exercises reviewed  Vitamin D deficiency At goal at recent check; continue to recommend supplementation for goal of 60-100 Defer vitamin D level  Abnormal glucose Recent A1Cs at goal Discussed diet/exercise, weight management  Defer A1C; check CMP  Mixed hyperlipidemia Newly on rosuvastatin with LDL at goal  Continue low cholesterol diet and exercise.  Check lipid panel.   Medication management CBC, CMP/GFR, magnesium   Overweight (BMI 25.0-29.9) Long discussion about weight loss, diet, and exercise Recommended diet heavy in fruits and veggies and low in animal meats, cheeses, and dairy products, appropriate calorie intake Discussed appropriate weight for height and initial goal  Follow up at next visit  R knee arthritis Follow up Dr. Berenice Primas, getting injections  Feeling unwell/ congestion/ hx of  seasonal allergies Check CBC, CMP, add UA Benign exam, URI sx possible virus of r/t allergies Discussed the importance of avoiding unnecessary antibiotic therapy. Suggested symptomatic OTC remedies. Nasal saline spray for congestion. Nasal steroids, do allergy pill daily in AM, oral steroids if not improving Follow up as needed.  Over 40 minutes of exam, counseling, chart review and critical decision making was performed Future Appointments  Date Time Provider [facility]  09/23/2020  2:30 PM Unk Pinto, MD [facility] None  03/15/2021  2:00 PM Unk Pinto, MD [facility] None    Plan:   During the course of the visit the patient was educated and counseled about appropriate screening and preventive services including:    Pneumococcal vaccine   Prevnar 13  Influenza vaccine  Td vaccine  Screening electrocardiogram  Bone densitometry screening  Colorectal cancer screening  Diabetes screening  Glaucoma screening  Nutrition counseling   Advanced directives: requested   Subjective:  Emily Jenkins is a 74 y.o. female who presents for Medicare Annual Wellness Visit and 3 month follow up.   She reports feeling vaguely unwell, fatigued and wiped out for the last 2-3 days, intermittent sinus pressure, chills/sweats, no temp at home but low grade temp today, mildly sore throat; mild intermittent HA (took tylenol which resolved). Does have seasonal allergies, currently endorses some sneezing/itchy watery eyes, takes zyrtec if intolerable sx. Denies cough, dyspnea, sinus pain, dizziness, GI sx, urinary sx, rash, arthralgias/myalgias. No known sick contacts, had covid vaccine.    She is followed by Dr. Lorin Mercy for R knee arthritis, getting injections, needs surgery but has been deferring. R metatarsal fracture in May 2021 after rolling foot but is out of boot and doing well.   she has a diagnosis of GERD which is currently managed by protonix 40 mg daily  she  reports symptoms is currently well controlled, and denies breakthrough reflux, burning in chest, hoarseness or cough.    BMI is Body mass index is 25.86 kg/m., she has been working on diet and exercise, walking up and down her driveway in the morning when cool, 15 min or so.  Wt Readings from Last 3 Encounters:  06/22/20 155 lb 6.4 oz (70.5 kg)  05/19/20 155 lb (70.3 kg)  04/09/20 154 lb (69.9 kg)    Her blood pressure has been controlled at home (130/80 yesterday), today their BP is BP: 138/70 She does workout. She denies chest pain, shortness of breath, dizziness.   She is on cholesterol medication (rosuvastatin 5 mg daily and tolerating well) and denies myalgias. Her cholesterol is at goal. The cholesterol last visit was:   Lab Results  Component Value Date   CHOL 160 03/08/2020   HDL 61 03/08/2020   LDLCALC 83 03/08/2020   TRIG 81 03/08/2020   CHOLHDL 2.6 03/08/2020    She has not been working on diet and exercise for glucose management, and denies increased appetite, nausea, paresthesia of the feet, polydipsia, polyuria and visual disturbances. Last A1C in the office was:  Lab Results  Component Value Date   HGBA1C 5.7 (H) 03/08/2020   Last GFR: Lab Results  Component Value Date   GFRAA 68 04/09/2020   Patient is on Vitamin D supplement.   Lab Results  Component Value Date   VD25OH 66 03/08/2020      Medication Review: Current Outpatient Medications on File Prior to Visit  Medication Sig Dispense Refill  . aspirin 81 MG tablet Take 81 mg by mouth every evening.     Marland Kitchen atenolol (TENORMIN) 50 MG tablet Take 1 tablet Daily at Suppertime for BP & Palpitations 90 tablet 3  . cholecalciferol (VITAMIN D) 1000 units tablet Take 5,000 Units by mouth daily.     . polyethylene glycol (MIRALAX / GLYCOLAX) packet Take 17 g by mouth daily.     . rosuvastatin (CRESTOR) 5 MG tablet Take 1 tablet daily for cholesterol. 90 tablet 1  . telmisartan (MICARDIS) 80 MG tablet Take 1 tablet  daily for blood pressure. 90 tablet 1  . vitamin C (ASCORBIC ACID) 500 MG tablet Take 500 mg by mouth daily.     No current facility-administered medications on file prior to visit.    Allergies  Allergen Reactions  . Escitalopram Itching  . Fosamax [Alendronate Sodium] Itching  . Levofloxacin     Headache, chest pain, muscle aches  . Sulfa Antibiotics Itching  . Tramadol Itching    Current Problems (verified) Patient Active Problem List   Diagnosis Date Noted  . Fracture of 5th metatarsal 05/19/2020  . Bilateral primary osteoarthritis of knee 08/20/2019  . Arthritis of right knee 11/11/2018  . Overweight (BMI 25.0-29.9) 10/29/2015  . Osteoporosis 06/07/2015  . GERD  03/04/2015  . Medication management 01/05/2014  . Essential hypertension 12/02/2013  . Mixed hyperlipidemia 12/02/2013  . Other abnormal glucose (prediabetes) 12/02/2013  . Vitamin D deficiency 12/02/2013  . Varicose veins of both lower extremities 09/16/2012    Screening Tests Immunization History  Administered Date(s) Administered  . DTaP 12/11/2000  . Influenza Split 10/24/2013  . Influenza, High Dose Seasonal PF 09/17/2014, 09/09/2015, 08/21/2016, 09/21/2017, 10/14/2018, 10/13/2019  . Moderna SARS-COVID-2 Vaccination 01/18/2020, 02/18/2020  . Pneumococcal Conjugate-13 08/21/2016  . Pneumococcal Polysaccharide-23 12/11/2002, 01/08/2018  . Tdap 07/11/2012  . Zoster 08/06/2013   Prior vaccinations: TD or Tdap: 2013  Influenza: 2020 Pneumococcal: 2004, 2019 Prevnar 13: 2017 Shingles/Zostavax: 2014 Covid 19: 2/2, 2021, moderna   Last colonoscopy: 07/2010; DUE 2021 - has scheduled in August 2021 Last EGD: 03/2017 Last mammogram: 10/2019 Last pap smear/pelvic exam: pap 2014 DONE, pelvic 2017 DEXA: 2017, 11/2018 - R hip T improved from -2.8 to -2.5, fosamax intolerance (itching) - will schedule with next mammogram   CT chest 2014 CT head 2018 CT abd 2017 CXR 07/2014 MRI spine 2010 Stress test  07/2014 Cath 07/2014 normal  Names of Other Physician/Practitioners you currently use: 1. [facility]- here for primary care 2. [facility] in [place], eye doctor, last visit 2021 3. Dr. Harrington Challenger, dentist, last visit 2021, still going q32m without issues  Patient Care Team: Unk Pinto, MD as PCP - General (Internal Medicine) Celestia Khat, [place] (Optometry) Shela Leff, MD as Consulting Physician (Orthopedic Surgery) Juanita Craver, MD as Consulting Physician (Gastroenterology) Burnell Blanks, MD as Consulting Physician (Cardiology)  SURGICAL HISTORY She  has a past surgical history that includes Cholecystectomy; Tubal ligation; Ankle surgery; and left heart catheterization with coronary angiogram (N/A, 07/29/2014). FAMILY HISTORY Her family history includes Cancer in her father; Diabetes in her father; Heart disease (age of onset: 39) in her mother; Hyperlipidemia in her sister; Hypertension in her mother and sister. SOCIAL HISTORY She  reports that she has never smoked. She has never used smokeless tobacco. She reports that she does not drink alcohol and does not use drugs.   MEDICARE WELLNESS OBJECTIVES: Physical activity: Current Exercise Habits: Home exercise routine, Type of exercise: walking, Time (Minutes): 15, Frequency (Times/Week): 7, Weekly Exercise (Minutes/Week): 105, Intensity: Mild, Exercise limited by: orthopedic condition(s) Cardiac risk factors: Cardiac Risk Factors include: advanced age (>53men, >64 women);dyslipidemia;hypertension Depression/mood screen:   Depression screen [facility] 2/9 06/22/2020  Decreased Interest 0  Down, Depressed, Hopeless 1  PHQ - 2 Score 1  Altered sleeping 0  Tired, decreased energy 1  Change in appetite 0  Feeling bad or failure about yourself  0  Trouble concentrating 0  Moving slowly or fidgety/restless 0  Suicidal thoughts 0  PHQ-9 Score 2  Difficult doing  work/chores Not difficult at all    ADLs:  In your present state of health, do you have any difficulty performing the following activities: 06/22/2020 03/07/2020  Hearing? N N  Vision? N N  Difficulty concentrating or making decisions? N N  Walking or climbing stairs? N N  Comment can manage in small distances, knees limit stairs, minimal steps with handrails, can manage at this time, uses cane outside of the house -  Dressing or bathing? N N  Doing errands, shopping? N N  Some recent data might be hidden     Cognitive Testing  Alert? Yes  Normal Appearance?Yes  Oriented to person? Yes  Place? Yes   Time? Yes  Recall of three objects?  Yes  Can perform simple calculations? Yes  Displays appropriate judgment?Yes  Can read the correct time from a watch face?Yes  EOL planning: Does Patient Have a Medical Advance Directive?: No Would patient like information on creating a medical advance directive?: No - Patient declined  Review of Systems  Constitutional: Negative for malaise/fatigue and weight loss.  HENT: Positive for congestion and sore throat. Negative for hearing loss and tinnitus.   Eyes: Negative for blurred vision and double vision.  Respiratory: Negative for cough, sputum production, shortness of breath and wheezing.   Cardiovascular: Negative for  chest pain, palpitations, orthopnea, claudication, leg swelling and PND.  Gastrointestinal: Negative for abdominal pain, blood in stool, constipation, diarrhea, heartburn, melena, nausea and vomiting.  Genitourinary: Negative.   Musculoskeletal: Positive for joint pain (R knee). Negative for falls and myalgias.  Skin: Negative for rash.  Neurological: Negative for dizziness, tingling, sensory change, weakness and headaches.  Endo/Heme/Allergies: Positive for environmental allergies. Negative for polydipsia.  Psychiatric/Behavioral: Negative for depression, memory loss, substance abuse and suicidal ideas. The patient is not  nervous/anxious and does not have insomnia (intermittent).   All other systems reviewed and are negative.    Objective:     Today's Vitals   06/22/20 1349  BP: 138/70  Pulse: 89  Temp: 99.1 F (37.3 C)  SpO2: 97%  Weight: 155 lb 6.4 oz (70.5 kg)   Body mass index is 25.86 kg/m.  General appearance: alert, no distress, WD/WN, female HEENT: normocephalic, sclerae anicteric, TMs pearly, nares patent, no discharge or erythema, pharynx normal Oral cavity: MMM, no lesions Neck: supple, no lymphadenopathy, no thyromegaly, no masses Heart: RRR, normal S1, S2, no murmurs Lungs: CTA bilaterally, no wheezes, rhonchi, or rales Abdomen: +bs, soft, non tender, non distended, no masses, no hepatomegaly, no splenomegaly Musculoskeletal: nontender, no swelling, no obvious deformity Extremities: no edema, no cyanosis, no clubbing Pulses: 2+ symmetric, upper and lower extremities, normal cap refill Neurological: alert, oriented x 3, CN2-12 intact, strength normal upper extremities and lower extremities, sensation normal throughout, DTRs 2+ throughout, no cerebellar signs, gait antalgic, slow with cane  Psychiatric: normal affect, behavior normal, pleasant   Medicare Attestation I have personally reviewed: The patient's medical and social history Their use of alcohol, tobacco or illicit drugs Their current medications and supplements The patient's functional ability including ADLs,fall risks, home safety risks, cognitive, and hearing and visual impairment Diet and physical activities Evidence for depression or mood disorders  The patient's weight, height, BMI, and visual acuity have been recorded in the chart.  I have made referrals, counseling, and provided education to the patient based on review of the above and I have provided the patient with a written personalized care plan for preventive services.     Izora Ribas, NP   06/22/2020

## 2020-06-22 ENCOUNTER — Other Ambulatory Visit: Payer: Self-pay

## 2020-06-22 ENCOUNTER — Ambulatory Visit (INDEPENDENT_AMBULATORY_CARE_PROVIDER_SITE_OTHER): Payer: Medicare HMO | Admitting: Adult Health

## 2020-06-22 ENCOUNTER — Encounter: Payer: Self-pay | Admitting: Adult Health

## 2020-06-22 VITALS — BP 138/70 | HR 89 | Temp 99.1°F | Wt 155.4 lb

## 2020-06-22 DIAGNOSIS — I10 Essential (primary) hypertension: Secondary | ICD-10-CM

## 2020-06-22 DIAGNOSIS — Z79899 Other long term (current) drug therapy: Secondary | ICD-10-CM | POA: Diagnosis not present

## 2020-06-22 DIAGNOSIS — Z0000 Encounter for general adult medical examination without abnormal findings: Secondary | ICD-10-CM

## 2020-06-22 DIAGNOSIS — I8393 Asymptomatic varicose veins of bilateral lower extremities: Secondary | ICD-10-CM

## 2020-06-22 DIAGNOSIS — M810 Age-related osteoporosis without current pathological fracture: Secondary | ICD-10-CM

## 2020-06-22 DIAGNOSIS — R7309 Other abnormal glucose: Secondary | ICD-10-CM

## 2020-06-22 DIAGNOSIS — R6889 Other general symptoms and signs: Secondary | ICD-10-CM | POA: Diagnosis not present

## 2020-06-22 DIAGNOSIS — K219 Gastro-esophageal reflux disease without esophagitis: Secondary | ICD-10-CM

## 2020-06-22 DIAGNOSIS — M170 Bilateral primary osteoarthritis of knee: Secondary | ICD-10-CM

## 2020-06-22 DIAGNOSIS — Z0001 Encounter for general adult medical examination with abnormal findings: Secondary | ICD-10-CM

## 2020-06-22 DIAGNOSIS — K2100 Gastro-esophageal reflux disease with esophagitis, without bleeding: Secondary | ICD-10-CM

## 2020-06-22 DIAGNOSIS — E782 Mixed hyperlipidemia: Secondary | ICD-10-CM | POA: Diagnosis not present

## 2020-06-22 DIAGNOSIS — E663 Overweight: Secondary | ICD-10-CM | POA: Diagnosis not present

## 2020-06-22 DIAGNOSIS — E559 Vitamin D deficiency, unspecified: Secondary | ICD-10-CM

## 2020-06-22 MED ORDER — PANTOPRAZOLE SODIUM 40 MG PO TBEC: DELAYED_RELEASE_TABLET | ORAL | 3 refills | Status: DC

## 2020-06-22 NOTE — Patient Instructions (Addendum)
Emily Jenkins , Thank you for taking time to come for your Medicare Wellness Visit. I appreciate your ongoing commitment to your health goals. Please review the following plan we discussed and let me know if I can assist you in the future.   These are the goals we discussed: Goals    . Blood Pressure < 130/80    . Exercise 20 min daily        This is a list of the screening recommended for you and due dates:  Health Maintenance  Topic Date Due  . Flu Shot  07/11/2020  . Colon Cancer Screening  08/10/2020  . Mammogram  11/13/2020  . Tetanus Vaccine  07/11/2022  . DEXA scan (bone density measurement)  Completed  . COVID-19 Vaccine  Completed  .  Hepatitis C: One time screening is recommended by Center for Disease Control  (CDC) for  adults born from [birth year] through 1966.   Completed  . Pneumonia vaccines  Completed    Try senokot to help bowels move -    Try zyrtec in the morning for 1-2 weeks and do flonase nasal spray 1-2 in each nostril morning and evening while you have sinus symptoms. Please call if any new or worsening symptoms.       Please schedule your bone density with your mammogram in December this year or Jan of 2022  [facility]  7 a.m.-6:30 p.m., Monday 7 a.m.-5 p.m., Tuesday-Friday Schedule an appointment by calling [phone].    HOW TO TREAT VIRAL COUGH AND COLD SYMPTOMS:  -Symptoms usually last at least 1 week with the worst symptoms being around day 4.  - colds usually start with a sore throat and end with a cough, and the cough can take 2 weeks to get better.  -No antibiotics are needed for colds, flu, sore throats, cough, bronchitis UNLESS symptoms are longer than 7 days OR if you are getting better then get drastically worse.  -There are a lot of combination medications (Dayquil, Nyquil, Vicks 44, tyelnol cold and sinus, ETC). Please look at the ingredients on the back so that you are treating the correct symptoms and not  doubling up on medications/ingredients.    Medicines you can use  Nasal congestion  Little Remedies saline spray (aerosol/mist)- can try this, it is in the kids section - pseudoephedrine (Sudafed)- behind the counter, do not use if you have high blood pressure, medicine that have -D in them.  - phenylephrine (Sudafed PE) -Dextormethorphan + chlorpheniramine (Coridcidin HBP)- okay if you have high blood pressure -Oxymetazoline (Afrin) nasal spray- LIMIT to 3 days -Saline nasal spray -Neti pot (used distilled or bottled water)  Ear pain/congestion  -pseudoephedrine (sudafed) - Nasonex/flonase nasal spray  Fever  -Acetaminophen (Tyelnol) -Ibuprofen (Advil, motrin, aleve)  Sore Throat  -Acetaminophen (Tyelnol) -Ibuprofen (Advil, motrin, aleve) -Drink a lot of water -Gargle with salt water - Rest your voice (don't talk) -Throat sprays -Cough drops  Body Aches  -Acetaminophen (Tyelnol) -Ibuprofen (Advil, motrin, aleve)  Headache  -Acetaminophen (Tyelnol) -Ibuprofen (Advil, motrin, aleve) - Exedrin, Exedrin Migraine  Allergy symptoms (cough, sneeze, runny nose, itchy eyes) -Claritin or loratadine cheapest but likely the weakest  -Zyrtec or certizine at night because it can make you sleepy -The strongest is allegra or fexafinadine  Cheapest at [employer], [employer], [employer]  Cough  -Dextromethorphan (Delsym)- medicine that has DM in it -Guafenesin (Mucinex/Robitussin) - cough drops - drink lots of water  Chest Congestion  -Guafenesin (Mucinex/Robitussin)  Red Itchy Eyes  - Naphcon-A  Upset Stomach  - Bland diet (nothing spicy, greasy, fried, and high acid foods like tomatoes, oranges, berries) -OKAY- cereal, bread, soup, crackers, rice -Eat smaller more frequent meals -reduce caffeine, no alcohol -Loperamide (Imodium-AD) if diarrhea -Prevacid for heart burn  General health when sick  -Hydration -wash your hands frequently -keep surfaces clean -change pillow cases  and sheets often -Get fresh air but do not exercise strenuously -Vitamin D, double up on it - Vitamin C -Zinc

## 2020-06-23 LAB — CBC WITH DIFFERENTIAL/PLATELET
Absolute Monocytes: 258 cells/uL (ref 200–950)
Basophils Absolute: 41 cells/uL (ref 0–200)
Basophils Relative: 1.2 %
Eosinophils Absolute: 68 cells/uL (ref 15–500)
Eosinophils Relative: 2 %
HCT: 39.4 % (ref 35.0–45.0)
Hemoglobin: 12.8 g/dL (ref 11.7–15.5)
Lymphs Abs: 915 cells/uL (ref 850–3900)
MCH: 26.8 pg — ABNORMAL LOW (ref 27.0–33.0)
MCHC: 32.5 g/dL (ref 32.0–36.0)
MCV: 82.6 fL (ref 80.0–100.0)
MPV: 11.8 fL (ref 7.5–12.5)
Monocytes Relative: 7.6 %
Neutro Abs: 2118 cells/uL (ref 1500–7800)
Neutrophils Relative %: 62.3 %
Platelets: 240 10*3/uL (ref 140–400)
RBC: 4.77 10*6/uL (ref 3.80–5.10)
RDW: 13.9 % (ref 11.0–15.0)
Total Lymphocyte: 26.9 %
WBC: 3.4 10*3/uL — ABNORMAL LOW (ref 3.8–10.8)

## 2020-06-23 LAB — URINALYSIS, ROUTINE W REFLEX MICROSCOPIC
Bilirubin Urine: NEGATIVE
Glucose, UA: NEGATIVE
Hgb urine dipstick: NEGATIVE
Ketones, ur: NEGATIVE
Leukocytes,Ua: NEGATIVE
Nitrite: NEGATIVE
Protein, ur: NEGATIVE
Specific Gravity, Urine: 1.013 (ref 1.001–1.03)
pH: <=5 (ref 5.0–8.0)

## 2020-06-23 LAB — COMPLETE METABOLIC PANEL WITH GFR
AG Ratio: 1.7 (calc) (ref 1.0–2.5)
ALT: 8 U/L (ref 6–29)
AST: 14 U/L (ref 10–35)
Albumin: 4.3 g/dL (ref 3.6–5.1)
Alkaline phosphatase (APISO): 86 U/L (ref 37–153)
BUN: 16 mg/dL (ref 7–25)
CO2: 25 mmol/L (ref 20–32)
Calcium: 9.7 mg/dL (ref 8.6–10.4)
Chloride: 104 mmol/L (ref 98–110)
Creat: 0.66 mg/dL (ref 0.60–0.93)
GFR, Est African American: 101 mL/min/{1.73_m2} (ref >=60)
GFR, Est Non African American: 87 mL/min/{1.73_m2} (ref >=60)
Globulin: 2.5 g/dL (calc) (ref 1.9–3.7)
Glucose, Bld: 92 mg/dL (ref 65–99)
Potassium: 4.3 mmol/L (ref 3.5–5.3)
Sodium: 141 mmol/L (ref 135–146)
Total Bilirubin: 0.6 mg/dL (ref 0.2–1.2)
Total Protein: 6.8 g/dL (ref 6.1–8.1)

## 2020-06-23 LAB — TSH: TSH: 1.25 mIU/L (ref 0.40–4.50)

## 2020-06-23 LAB — LIPID PANEL
Cholesterol: 176 mg/dL (ref <200)
HDL: 73 mg/dL (ref >=50)
LDL Cholesterol (Calc): 89 mg/dL (calc)
Non-HDL Cholesterol (Calc): 103 mg/dL (calc) (ref <130)
Total CHOL/HDL Ratio: 2.4 (calc) (ref <5.0)
Triglycerides: 58 mg/dL (ref <150)

## 2020-06-23 LAB — MAGNESIUM: Magnesium: 2.1 mg/dL (ref 1.5–2.5)

## 2020-07-27 DIAGNOSIS — K5900 Constipation, unspecified: Secondary | ICD-10-CM | POA: Diagnosis not present

## 2020-07-27 DIAGNOSIS — Z1211 Encounter for screening for malignant neoplasm of colon: Secondary | ICD-10-CM | POA: Diagnosis not present

## 2020-07-27 DIAGNOSIS — K219 Gastro-esophageal reflux disease without esophagitis: Secondary | ICD-10-CM | POA: Diagnosis not present

## 2020-07-27 DIAGNOSIS — R140 Abdominal distension (gaseous): Secondary | ICD-10-CM | POA: Diagnosis not present

## 2020-09-03 DIAGNOSIS — Z1211 Encounter for screening for malignant neoplasm of colon: Secondary | ICD-10-CM | POA: Diagnosis not present

## 2020-09-03 DIAGNOSIS — D125 Benign neoplasm of sigmoid colon: Secondary | ICD-10-CM | POA: Diagnosis not present

## 2020-09-03 DIAGNOSIS — D122 Benign neoplasm of ascending colon: Secondary | ICD-10-CM | POA: Diagnosis not present

## 2020-09-03 DIAGNOSIS — K635 Polyp of colon: Secondary | ICD-10-CM | POA: Diagnosis not present

## 2020-09-03 LAB — HM COLONOSCOPY

## 2020-09-14 ENCOUNTER — Encounter: Payer: Self-pay | Admitting: Internal Medicine

## 2020-09-16 ENCOUNTER — Encounter: Payer: Self-pay | Admitting: Internal Medicine

## 2020-09-23 ENCOUNTER — Encounter: Payer: Self-pay | Admitting: Internal Medicine

## 2020-09-23 ENCOUNTER — Ambulatory Visit (INDEPENDENT_AMBULATORY_CARE_PROVIDER_SITE_OTHER): Payer: Medicare HMO | Admitting: Internal Medicine

## 2020-09-23 ENCOUNTER — Other Ambulatory Visit: Payer: Self-pay

## 2020-09-23 VITALS — BP 136/82 | HR 68 | Temp 97.4°F | Resp 16 | Ht 65.5 in | Wt 154.6 lb

## 2020-09-23 DIAGNOSIS — Z79899 Other long term (current) drug therapy: Secondary | ICD-10-CM | POA: Diagnosis not present

## 2020-09-23 DIAGNOSIS — R7309 Other abnormal glucose: Secondary | ICD-10-CM | POA: Diagnosis not present

## 2020-09-23 DIAGNOSIS — K219 Gastro-esophageal reflux disease without esophagitis: Secondary | ICD-10-CM | POA: Diagnosis not present

## 2020-09-23 DIAGNOSIS — E559 Vitamin D deficiency, unspecified: Secondary | ICD-10-CM | POA: Diagnosis not present

## 2020-09-23 DIAGNOSIS — Z23 Encounter for immunization: Secondary | ICD-10-CM

## 2020-09-23 DIAGNOSIS — E782 Mixed hyperlipidemia: Secondary | ICD-10-CM | POA: Diagnosis not present

## 2020-09-23 DIAGNOSIS — I10 Essential (primary) hypertension: Secondary | ICD-10-CM

## 2020-09-23 NOTE — Patient Instructions (Signed)

## 2020-09-23 NOTE — Progress Notes (Signed)
History of Present Illness:       This very nice 74 y.o.  [name] presents for 6 month follow up with HTN, HLD, Pre-Diabetes and Vitamin D Deficiency. Patient's GERD is controlled on her meds.       Patient is treated for HTN (1990's) & BP has been controlled at home. Today's BP is at goal - 136/82. Patient has had no complaints of any cardiac type chest pain, palpitations, dyspnea / orthopnea / PND, dizziness, claudication, or dependent edema.      Hyperlipidemia is controlled with diet & low dose Rosuvastatin. Patient denies myalgias or other med SE's. Last Lipids were at goal:  Lab Results  Component Value Date   CHOL 176 06/22/2020   HDL 73 06/22/2020   LDLCALC 89 06/22/2020   TRIG 58 06/22/2020   CHOLHDL 2.4 06/22/2020    Also, the patient has history of PreDiabetes (A1c 5.9% /2013)  and has had no symptoms of reactive hypoglycemia, diabetic polys, paresthesias or visual blurring.  Last A1c was near goal:  Lab Results  Component Value Date   HGBA1C 5.7 (H) 03/08/2020       Further, the patient also has history of Vitamin D Deficienc ("41" /2014) and supplements vitamin D without any suspected side-effects. Last vitamin D was at goal:  Lab Results  Component Value Date   VD25OH 66 03/08/2020    Current Outpatient Medications on File Prior to Visit  Medication Sig  . aspirin 81 MG tablet Take 81 mg by mouth every evening.   . cholecalciferol (VITAMIN D) 1000 units tablet Take 5,000 Units by mouth daily.   . pantoprazole (PROTONIX) 40 MG tablet Take 1 tablet daily for Indigestion & Heart burn  . psyllium (METAMUCIL) 58.6 % packet Take 1 packet by mouth daily.  . rosuvastatin (CRESTOR) 5 MG tablet Take 1 tablet daily for cholesterol.  . telmisartan (MICARDIS) 80 MG tablet Take 1 tablet daily for blood pressure.  . vitamin C (ASCORBIC ACID) 500 MG tablet Take 500 mg by mouth daily.  Marland Kitchen atenolol (TENORMIN) 50 MG tablet Take 1 tablet Daily at Suppertime for BP &  Palpitations (Patient not taking: Reported on 09/23/2020)   No current facility-administered medications on file prior to visit.    Allergies  Allergen Reactions  . Escitalopram Itching  . Fosamax [Alendronate Sodium] Itching  . Levofloxacin     Headache, chest pain, muscle aches  . Sulfa Antibiotics Itching  . Tramadol Itching    PMHx:   Past Medical History:  Diagnosis Date  . Depression 09/22/2017   09/21/2017 PHQ-9: 9  . DJD (degenerative joint disease)   . GERD (gastroesophageal reflux disease)   . Hyperlipidemia   . Hypertension   . Pre-diabetes   . RLS (restless legs syndrome)     Immunization History  Administered Date(s) Administered  . DTaP 12/11/2000  . Influenza Split 10/24/2013  . Influenza, High Dose Seasonal PF 09/17/2014, 09/09/2015, 08/21/2016, 09/21/2017, 10/14/2018, 10/13/2019, 09/23/2020  . Moderna SARS-COVID-2 Vaccination 01/18/2020, 02/18/2020  . Pneumococcal Conjugate-13 08/21/2016  . Pneumococcal Polysaccharide-23 12/11/2002, 01/08/2018  . Tdap 07/11/2012  . Zoster 08/06/2013    Past Surgical History:  Procedure Laterality Date  . ANKLE SURGERY    . CHOLECYSTECTOMY    . LEFT HEART CATHETERIZATION WITH CORONARY ANGIOGRAM N/A 07/29/2014   Procedure: LEFT HEART CATHETERIZATION WITH CORONARY ANGIOGRAM;  Surgeon: Burnell Blanks, MD;  Location[facility] CATH LAB;  Service: Cardiovascular;  Laterality: N/A;  . TUBAL LIGATION  FHx:    Reviewed / unchanged  SHx:    Reviewed / unchanged   Systems Review:  Constitutional: Denies fever, chills, wt changes, headaches, insomnia, fatigue, night sweats, change in appetite. Eyes: Denies redness, blurred vision, diplopia, discharge, itchy, watery eyes.  ENT: Denies discharge, congestion, post nasal drip, epistaxis, sore throat, earache, hearing loss, dental pain, tinnitus, vertigo, sinus pain, snoring.  CV: Denies chest pain, palpitations, irregular heartbeat, syncope, dyspnea, diaphoresis,  orthopnea, PND, claudication or edema. Respiratory: denies cough, dyspnea, DOE, pleurisy, hoarseness, laryngitis, wheezing.  Gastrointestinal: Denies dysphagia, odynophagia, heartburn, reflux, water brash, abdominal pain or cramps, nausea, vomiting, bloating, diarrhea, constipation, hematemesis, melena, hematochezia  or hemorrhoids. Genitourinary: Denies dysuria, frequency, urgency, nocturia, hesitancy, discharge, hematuria or flank pain. Musculoskeletal: Denies arthralgias, myalgias, stiffness, jt. swelling, pain, limping or strain/sprain.  Skin: Denies pruritus, rash, hives, warts, acne, eczema or change in skin lesion(s). Neuro: No weakness, tremor, incoordination, spasms, paresthesia or pain. Psychiatric: Denies confusion, memory loss or sensory loss. Endo: Denies change in weight, skin or hair change.  Heme/Lymph: No excessive bleeding, bruising or enlarged lymph nodes.  Physical Exam  BP 136/82   Pulse 68   Temp (!) 97.4 F (36.3 C)   Resp 16   Ht 5' 5.5" (1.664 m)   Wt 154 lb 9.6 oz (70.1 kg)   SpO2 96%   BMI 25.34 kg/m   Appears  well nourished, well groomed  and in no distress.  Eyes: PERRLA, EOMs, conjunctiva no swelling or erythema. Sinuses: No frontal/maxillary tenderness ENT/Mouth: EAC's clear, TM's nl w/o erythema, bulging. Nares clear w/o erythema, swelling, exudates. Oropharynx clear without erythema or exudates. Oral hygiene is good. Tongue normal, non obstructing. Hearing intact.  Neck: Supple. Thyroid not palpable. Car 2+/2+ without bruits, nodes or JVD. Chest: Respirations nl with BS clear & equal w/o rales, rhonchi, wheezing or stridor.  Cor: Heart sounds normal w/ regular rate and rhythm without sig. murmurs, gallops, clicks or rubs. Peripheral pulses normal and equal  without edema.  Abdomen: Soft & bowel sounds normal. Non-tender w/o guarding, rebound, hernias, masses or organomegaly.  Lymphatics: Unremarkable.  Musculoskeletal: Full ROM all peripheral  extremities, joint stability, 5/5 strength and normal gait.  Skin: Warm, dry without exposed rashes, lesions or ecchymosis apparent.  Neuro: Cranial nerves intact, reflexes equal bilaterally. Sensory-motor testing grossly intact. Tendon reflexes grossly intact.  Pysch: Alert & oriented x 3.  Insight and judgement nl & appropriate. No ideations.  Assessment and Plan:  1. Essential hypertension  - Continue medication, monitor blood pressure at home.  - Continue DASH diet.  Reminder to go to the ER if any CP,  SOB, nausea, dizziness, severe HA, changes vision/speech.  - CBC with Differential/Platelet - COMPLETE METABOLIC PANEL WITH GFR - Magnesium - TSH  2. Hyperlipidemia, mixed  - Continue diet/meds, exercise,& lifestyle modifications.  - Continue monitor periodic cholesterol/liver & renal functions   - Lipid panel  3. Abnormal glucose  - Continue diet, exercise  - Lifestyle modifications.  - Monitor appropriate labs.  - Hemoglobin A1c - Insulin, random  4. Vitamin D deficiency  - Continue supplementation.  - VITAMIN D 25 Hydroxy   5. Gastroesophageal reflux disease  - CBC with Differential/Platelet  6. Medication management  - CBC with Differential/Platelet - COMPLETE METABOLIC PANEL WITH GFR - Magnesium - Lipid panel - TSH - Hemoglobin A1c - Insulin, random - VITAMIN D 25 Hydroxy  7. Need for immunization against influenza  - Flu vaccine HIGH DOSE PF (Fluzone High dose)  Discussed  regular exercise, BP monitoring, weight control to achieve/maintain BMI less than 25 and discussed med and SE's. Recommended labs to assess and monitor clinical status with further disposition pending results of labs.  I discussed the assessment and treatment plan with the patient. The patient was provided an opportunity to ask questions and all were answered. The patient agreed with the plan and demonstrated an understanding of the instructions.  I provided over 30 minutes  of exam, counseling, chart review and  complex critical decision making.   Kirtland Bouchard, MD

## 2020-09-24 LAB — LIPID PANEL
Cholesterol: 174 mg/dL (ref <200)
HDL: 73 mg/dL (ref >=50)
LDL Cholesterol (Calc): 86 mg/dL (calc)
Non-HDL Cholesterol (Calc): 101 mg/dL (calc) (ref <130)
Total CHOL/HDL Ratio: 2.4 (calc) (ref <5.0)
Triglycerides: 63 mg/dL (ref <150)

## 2020-09-24 LAB — COMPLETE METABOLIC PANEL WITH GFR
AG Ratio: 1.8 (calc) (ref 1.0–2.5)
ALT: 6 U/L (ref 6–29)
AST: 16 U/L (ref 10–35)
Albumin: 4.4 g/dL (ref 3.6–5.1)
Alkaline phosphatase (APISO): 83 U/L (ref 37–153)
BUN: 13 mg/dL (ref 7–25)
CO2: 28 mmol/L (ref 20–32)
Calcium: 10 mg/dL (ref 8.6–10.4)
Chloride: 104 mmol/L (ref 98–110)
Creat: 0.93 mg/dL (ref 0.60–0.93)
GFR, Est African American: 70 mL/min/{1.73_m2} (ref >=60)
GFR, Est Non African American: 60 mL/min/{1.73_m2} (ref >=60)
Globulin: 2.5 g/dL (calc) (ref 1.9–3.7)
Glucose, Bld: 89 mg/dL (ref 65–99)
Potassium: 4.6 mmol/L (ref 3.5–5.3)
Sodium: 142 mmol/L (ref 135–146)
Total Bilirubin: 0.7 mg/dL (ref 0.2–1.2)
Total Protein: 6.9 g/dL (ref 6.1–8.1)

## 2020-09-24 LAB — HEMOGLOBIN A1C
Hgb A1c MFr Bld: 5.8 % of total Hgb — ABNORMAL HIGH (ref <5.7)
Mean Plasma Glucose: 120 (calc)
eAG (mmol/L): 6.6 (calc)

## 2020-09-24 LAB — CBC WITH DIFFERENTIAL/PLATELET
Absolute Monocytes: 293 cells/uL (ref 200–950)
Basophils Absolute: 42 cells/uL (ref 0–200)
Basophils Relative: 1.1 %
Eosinophils Absolute: 61 cells/uL (ref 15–500)
Eosinophils Relative: 1.6 %
HCT: 40.7 % (ref 35.0–45.0)
Hemoglobin: 13.1 g/dL (ref 11.7–15.5)
Lymphs Abs: 931 cells/uL (ref 850–3900)
MCH: 26.4 pg — ABNORMAL LOW (ref 27.0–33.0)
MCHC: 32.2 g/dL (ref 32.0–36.0)
MCV: 81.9 fL (ref 80.0–100.0)
MPV: 12.3 fL (ref 7.5–12.5)
Monocytes Relative: 7.7 %
Neutro Abs: 2474 cells/uL (ref 1500–7800)
Neutrophils Relative %: 65.1 %
Platelets: 250 10*3/uL (ref 140–400)
RBC: 4.97 10*6/uL (ref 3.80–5.10)
RDW: 13.6 % (ref 11.0–15.0)
Total Lymphocyte: 24.5 %
WBC: 3.8 10*3/uL (ref 3.8–10.8)

## 2020-09-24 LAB — VITAMIN D 25 HYDROXY (VIT D DEFICIENCY, FRACTURES): Vit D, 25-Hydroxy: 76 ng/mL (ref 30–100)

## 2020-09-24 LAB — INSULIN, RANDOM: Insulin: 4.7 u[IU]/mL

## 2020-09-24 LAB — TSH: TSH: 1.73 mIU/L (ref 0.40–4.50)

## 2020-09-24 LAB — MAGNESIUM: Magnesium: 2 mg/dL (ref 1.5–2.5)

## 2020-09-25 NOTE — Progress Notes (Signed)
========================================================== ==========================================================  -    Total Chol = 174 and LDL Chol = m86 - Both  Excellent   - Very low risk for Heart Attack  / Stroke =============================================================  - A1c = 5.8% still slightly elevated sugars , So . . . . . . . . . . Marland Kitchen   - Avoid Sweets, Candy & White Stuff   - Rice, Potatoes, Breads &  Pasta ==========================================================  -  Vitamin D = 76 - Excellent  ==========================================================  -  All Else - CBC - Kidneys - Electrolytes - Liver - Magnesium & Thyroid    - all  Normal / OK ====================================================

## 2020-10-11 ENCOUNTER — Other Ambulatory Visit: Payer: Self-pay | Admitting: Internal Medicine

## 2020-10-11 DIAGNOSIS — R519 Headache, unspecified: Secondary | ICD-10-CM

## 2020-10-11 DIAGNOSIS — I10 Essential (primary) hypertension: Secondary | ICD-10-CM

## 2020-10-18 DIAGNOSIS — J019 Acute sinusitis, unspecified: Secondary | ICD-10-CM | POA: Diagnosis not present

## 2020-11-17 ENCOUNTER — Other Ambulatory Visit: Payer: Self-pay | Admitting: Internal Medicine

## 2020-11-17 DIAGNOSIS — M545 Low back pain, unspecified: Secondary | ICD-10-CM | POA: Diagnosis not present

## 2020-11-19 DIAGNOSIS — Z1231 Encounter for screening mammogram for malignant neoplasm of breast: Secondary | ICD-10-CM | POA: Diagnosis not present

## 2020-12-01 DIAGNOSIS — R928 Other abnormal and inconclusive findings on diagnostic imaging of breast: Secondary | ICD-10-CM | POA: Diagnosis not present

## 2020-12-01 LAB — HM MAMMOGRAPHY

## 2020-12-15 DIAGNOSIS — K219 Gastro-esophageal reflux disease without esophagitis: Secondary | ICD-10-CM | POA: Diagnosis not present

## 2020-12-15 DIAGNOSIS — Z888 Allergy status to other drugs, medicaments and biological substances status: Secondary | ICD-10-CM | POA: Diagnosis not present

## 2020-12-15 DIAGNOSIS — Z8249 Family history of ischemic heart disease and other diseases of the circulatory system: Secondary | ICD-10-CM | POA: Diagnosis not present

## 2020-12-15 DIAGNOSIS — Z008 Encounter for other general examination: Secondary | ICD-10-CM | POA: Diagnosis not present

## 2020-12-15 DIAGNOSIS — I10 Essential (primary) hypertension: Secondary | ICD-10-CM | POA: Diagnosis not present

## 2020-12-15 DIAGNOSIS — K5900 Constipation, unspecified: Secondary | ICD-10-CM | POA: Diagnosis not present

## 2020-12-15 DIAGNOSIS — Z833 Family history of diabetes mellitus: Secondary | ICD-10-CM | POA: Diagnosis not present

## 2020-12-15 DIAGNOSIS — M1990 Unspecified osteoarthritis, unspecified site: Secondary | ICD-10-CM | POA: Diagnosis not present

## 2020-12-15 DIAGNOSIS — E785 Hyperlipidemia, unspecified: Secondary | ICD-10-CM | POA: Diagnosis not present

## 2020-12-15 DIAGNOSIS — Z87892 Personal history of anaphylaxis: Secondary | ICD-10-CM | POA: Diagnosis not present

## 2020-12-15 DIAGNOSIS — Z882 Allergy status to sulfonamides status: Secondary | ICD-10-CM | POA: Diagnosis not present

## 2020-12-22 NOTE — Progress Notes (Signed)
Assessment and Plan:  Diagnoses and all orders for this visit:  Epigastric abdominal tenderness without rebound tenderness Low suspicion cardiac etiology; suspect GERD with gastritis/esophagitis and element of anxiety Move protonix to PM, elevate HOB, lifestyle discussed at length, bland diet, no ETOH/NSAIDs, add carafate dissolved QID, can add pepcid AM if needed Can still try mylanta if breakthrough - Advised if this helps resolve, can r/o cardiac Close follow up on progress tomorrow AM, close follow up in 2 weeks or sooner if needed; consider may need GI follow up with Dr. Collene Mares for repeat EGD and consider need for H. Pylori r/o by biopsy  -     CBC with Differential/Platelet -     COMPLETE METABOLIC PANEL WITH GFR -     Lipase -     sucralfate (CARAFATE) 1 g tablet; Take 1 tab dissolved in 2-3 oz of water 4 times daily, 30 min prior to meals and prior to bedtime.  Chest pain, unspecified type Note normal EKG, had low risk cath 2015, low suspicion cardiac etiology but consider referral back to cardiology if otherwise unclear etiology and not responding to GI interventions;  Go to the ER if any persistentchest pain, shortness of breath, nausea, dizziness, severe HA, changes vision/speech -     EKG 12-Lead -     DG Chest 2 View; Future  Other orders -     rosuvastatin (CRESTOR) 5 MG tablet; Take 1 tablet daily for cholesterol.   Further disposition pending results of labs. Discussed med's effects and SE's.   Over 30 minutes of exam, counseling, chart review, and critical decision making was performed.   Future Appointments  Date Time Provider [place]  01/11/2021 10:30 AM Garnet Sierras, NP [facility] None  04/29/2021 11:00 AM Unk Pinto, MD [facility] None  06/27/2021  2:30 PM McClanahan, Danton Sewer, NP [facility] None    ------------------------------------------------------------------------------------------------------------------   HPI BP (!) 142/78   Pulse 81    Temp 97.7 F (36.5 C)   Wt 155 lb (70.3 kg)   SpO2 97%   BMI 25.40 kg/m   75 y.o.female with hx of htn, hyperlipidemia, GERD presents for evaluation of of episode of chest pain/tightness/burning.   She called yesterday reporting chest tightness, chest pain "burning," feeling short of breath - was advise to try mylanta and present to the ED if sx didn't resolve. She presents today stating took prior to dinner and prior to bedtime without further episodes.   She reports these intermittent episodes began about 1 month ago, typically at night, 3 hours or so after sleeping will wake up with burning pain in her chest, tightness, foul taste in her mouth, drenching sweats, but will also have during the day and correlates with some exertion/lifting, but also may have with eating.   She also reports sensation of water/pills getting stuck in lower chest; known hx of small hiatal hernia per last EGD 03/21/2017 by Dr. Collene Mares, otherwise normal. Taking protonix 40 mg AM and endorses breakthrough reflux sx.   She does endorse fatigue, but this is ongoing over 1 year, not new   Notably had abnormal myoview 2015 but completely normal cath 2015, felt low risk CAD. She typically has well controlled BP and lipids.   She denies ETOH, NSAID use; do note on review has had recurrent "chest pain episodes" with unremarkable workups;    Past Medical History:  Diagnosis Date  . Depression 09/22/2017   09/21/2017 PHQ-9: 9  . DJD (degenerative joint disease)   . GERD (  gastroesophageal reflux disease)   . Hyperlipidemia   . Hypertension   . Pre-diabetes   . RLS (restless legs syndrome)      Allergies  Allergen Reactions  . Escitalopram Itching  . Fosamax [Alendronate Sodium] Itching  . Levofloxacin     Headache, chest pain, muscle aches  . Sulfa Antibiotics Itching  . Tramadol Itching    Current Outpatient Medications on File Prior to Visit  Medication Sig  . aspirin 81 MG tablet Take 81 mg by mouth every  evening.   . cholecalciferol (VITAMIN D) 1000 units tablet Take 5,000 Units by mouth daily.   . pantoprazole (PROTONIX) 40 MG tablet Take 1 tablet daily for Indigestion & Heart burn  . psyllium (METAMUCIL) 58.6 % packet Take 1 packet by mouth daily.  . rosuvastatin (CRESTOR) 5 MG tablet Take 1 tablet daily for cholesterol.  . telmisartan (MICARDIS) 80 MG tablet Take       1 tablet      Daily       for BP  . vitamin C (ASCORBIC ACID) 500 MG tablet Take 500 mg by mouth daily.  Marland Kitchen atenolol (TENORMIN) 50 MG tablet Take 1 tablet Daily at Suppertime for BP & Palpitations (Patient not taking: No sig reported)   No current facility-administered medications on file prior to visit.    ROS: all negative except above.   Physical Exam:  BP (!) 142/78   Pulse 81   Temp 97.7 F (36.5 C)   Wt 155 lb (70.3 kg)   SpO2 97%   BMI 25.40 kg/m   General Appearance: Well nourished, well dressed elderly AA female, in no apparent distress. Eyes: PERRLA, EOMs, conjunctiva no swelling or erythema Sinuses: No Frontal/maxillary tenderness ENT/Mouth: Ext aud canals clear, TMs without erythema, bulging. No erythema, swelling, or exudate on post pharynx.  Tonsils not swollen or erythematous. Hearing normal.  Neck: Supple, thyroid normal.  Respiratory: Respiratory effort normal, BS equal bilaterally without rales, rhonchi, wheezing or stridor.  Cardio: RRR with no MRGs. Brisk peripheral pulses without edema.  Abdomen: Soft, + BS.  + epigastric tenderness, no guarding, rebound, hernias, masses.  Lymphatics: Non tender without lymphadenopathy.  Musculoskeletal: Full ROM, 5/5 strength, normal gait. Non-tender through chest.  Skin: Warm, dry without rashes, lesions, ecchymosis.  Neuro: Cranial nerves intact. Normal muscle tone, no cerebellar symptoms. Sensation intact.  Psych: Awake and oriented X 3, normal affect, Insight and Judgment appropriate.     Izora Ribas, NP 10:01 AM [facility]

## 2020-12-23 ENCOUNTER — Ambulatory Visit (INDEPENDENT_AMBULATORY_CARE_PROVIDER_SITE_OTHER): Payer: Medicare HMO | Admitting: Adult Health

## 2020-12-23 ENCOUNTER — Encounter: Payer: Self-pay | Admitting: Adult Health

## 2020-12-23 ENCOUNTER — Ambulatory Visit
Admission: RE | Admit: 2020-12-23 | Discharge: 2020-12-23 | Disposition: A | Discharge status: Home / Self Care | Payer: Medicare HMO | Source: Ambulatory Visit | Attending: Adult Health | Admitting: Adult Health

## 2020-12-23 ENCOUNTER — Other Ambulatory Visit: Payer: Self-pay

## 2020-12-23 VITALS — BP 142/78 | HR 81 | Temp 97.7°F | Wt 155.0 lb

## 2020-12-23 DIAGNOSIS — R10816 Epigastric abdominal tenderness: Secondary | ICD-10-CM | POA: Diagnosis not present

## 2020-12-23 DIAGNOSIS — R079 Chest pain, unspecified: Secondary | ICD-10-CM

## 2020-12-23 MED ORDER — ROSUVASTATIN CALCIUM 5 MG PO TABS: ORAL_TABLET | ORAL | 1 refills | Status: DC

## 2020-12-23 MED ORDER — SUCRALFATE 1 G PO TABS: ORAL_TABLET | ORAL | 1 refills | Status: DC

## 2020-12-23 NOTE — Patient Instructions (Addendum)
If your symptoms become worse, please go to the ED  Try carafate dissolved in water 30 min prior to each meal and before bedtime  Please try sleeping with the head of your bed raised - 2-3 inch blocks under headboard supports  Try moving pantoprazole to 30 min prior to dinner  Can add famotidine (pepcid) 30 min prior to breakfast if needed  Continue to take mylanta if any burning episodes  Please review diet recommendations below - avoid spicy, tomatoes, caffeine, etc Also avoid bananas, mint    Gastroesophageal Reflux Disease, Adult  Gastroesophageal reflux (GER) happens when acid from the stomach flows up into the tube that connects the mouth and the stomach (esophagus). Normally, food travels down the esophagus and stays in the stomach to be digested. With GER, food and stomach acid sometimes move back up into the esophagus. You may have a disease called gastroesophageal reflux disease (GERD) if the reflux:  Happens often.  Causes frequent or very bad symptoms.  Causes problems such as damage to the esophagus. When this happens, the esophagus becomes sore and swollen. Over time, GERD can make small holes (ulcers) in the lining of the esophagus. What are the causes? This condition is caused by a problem with the muscle between the esophagus and the stomach. When this muscle is weak or not normal, it does not close properly to keep food and acid from coming back up from the stomach. The muscle can be weak because of:  Tobacco use.  Pregnancy.  Having a certain type of hernia (hiatal hernia).  Alcohol use.  Certain foods and drinks, such as coffee, chocolate, onions, and peppermint. What increases the risk?  Being overweight.  Having a disease that affects your connective tissue.  Taking NSAIDs, such a ibuprofen. What are the signs or symptoms?  Heartburn.  Difficult or painful swallowing.  The feeling of having a lump in the throat.  A bitter taste in the  mouth.  Bad breath.  Having a lot of saliva.  Having an upset or bloated stomach.  Burping.  Chest pain. Different conditions can cause chest pain. Make sure you see your doctor if you have chest pain.  Shortness of breath or wheezing.  A long-term cough or a cough at night.  Wearing away of the surface of teeth (tooth enamel).  Weight loss. How is this treated?  Making changes to your diet.  Taking medicine.  Having surgery. Treatment will depend on how bad your symptoms are. Follow these instructions at home: Eating and drinking  Follow a diet as told by your doctor. You may need to avoid foods and drinks such as: ? Coffee and tea, with or without caffeine. ? Drinks that contain alcohol. ? Energy drinks and sports drinks. ? Bubbly (carbonated) drinks or sodas. ? Chocolate and cocoa. ? Peppermint and mint flavorings. ? Garlic and onions. ? Horseradish. ? Spicy and acidic foods. These include peppers, chili powder, curry powder, vinegar, hot sauces, and BBQ sauce. ? Citrus fruit juices and citrus fruits, such as oranges, lemons, and limes. ? Tomato-based foods. These include red sauce, chili, salsa, and pizza with red sauce. ? Fried and fatty foods. These include donuts, french fries, potato chips, and high-fat dressings. ? High-fat meats. These include hot dogs, rib eye steak, sausage, ham, and bacon. ? High-fat dairy items, such as whole milk, butter, and cream cheese.  Eat small meals often. Avoid eating large meals.  Avoid drinking large amounts of liquid with your meals.  Avoid eating meals during the 2-3 hours before bedtime.  Avoid lying down right after you eat.  Do not exercise right after you eat.   Lifestyle  Do not smoke or use any products that contain nicotine or tobacco. If you need help quitting, ask your doctor.  Try to lower your stress. If you need help doing this, ask your doctor.  If you are overweight, lose an amount of weight that is  healthy for you. Ask your doctor about a safe weight loss goal.   General instructions  Pay attention to any changes in your symptoms.  Take over-the-counter and prescription medicines only as told by your doctor.  Do not take aspirin, ibuprofen, or other NSAIDs unless your doctor says it is okay.  Wear loose clothes. Do not wear anything tight around your waist.  Raise (elevate) the head of your bed about 6 inches (15 cm). You may need to use a wedge to do this.  Avoid bending over if this makes your symptoms worse.  Keep all follow-up visits. Contact a doctor if:  You have new symptoms.  You lose weight and you do not know why.  You have trouble swallowing or it hurts to swallow.  You have wheezing or a cough that keeps happening.  You have a hoarse voice.  Your symptoms do not get better with treatment. Get help right away if:  You have sudden pain in your arms, neck, jaw, teeth, or back.  You suddenly feel sweaty, dizzy, or light-headed.  You have chest pain or shortness of breath.  You vomit and the vomit is green, yellow, or black, or it looks like blood or coffee grounds.  You faint.  Your poop (stool) is red, bloody, or black.  You cannot swallow, drink, or eat. These symptoms may represent a serious problem that is an emergency. Do not wait to see if the symptoms will go away. Get medical help right away. Call your local emergency services (911 in the [place] Do not drive yourself to the hospital. Summary  If a person has gastroesophageal reflux disease (GERD), food and stomach acid move back up into the esophagus and cause symptoms or problems such as damage to the esophagus.  Treatment will depend on how bad your symptoms are.  Follow a diet as told by your doctor.  Take all medicines only as told by your doctor. This information is not intended to replace advice given to you by your health care provider. Make sure you discuss any questions you have  with your health care provider. Document Revised: 06/07/2020 Document Reviewed: 06/07/2020 Elsevier Patient Education  2021 [employer].    Nonspecific Chest Pain Chest pain can be caused by many different conditions. Some causes of chest pain can be life-threatening. These will require treatment right away. Serious causes of chest pain include:  Heart attack.  A tear in the body's main blood vessel.  Redness and swelling (inflammation) around your heart.  Blood clot in your lungs. Other causes of chest pain may not be so serious. These include:  Heartburn.  Anxiety or stress.  Damage to bones or muscles in your chest.  Lung infections. Chest pain can feel like:  Pain or discomfort in your chest.  Crushing, pressure, aching, or squeezing pain.  Burning or tingling.  Dull or sharp pain that is worse when you move, cough, or take a deep breath.  Pain or discomfort that is also felt in your back, neck, jaw, shoulder, or arm,  or pain that spreads to any of these areas. It is hard to know whether your pain is caused by something that is serious or something that is not so serious. So it is important to see your doctor right away if you have chest pain. Follow these instructions at home: Medicines  Take over-the-counter and prescription medicines only as told by your doctor.  If you were prescribed an antibiotic medicine, take it as told by your doctor. Do not stop taking the antibiotic even if you start to feel better. Lifestyle  Rest as told by your doctor.  Do not use any products that contain nicotine or tobacco, such as cigarettes, e-cigarettes, and chewing tobacco. If you need help quitting, ask your doctor.  Do not drink alcohol.  Make lifestyle changes as told by your doctor. These may include: ? Getting regular exercise. Ask your doctor what activities are safe for you. ? Eating a heart-healthy diet. A diet and nutrition specialist (dietitian) can help you to  learn healthy eating options. ? Staying at a healthy weight. ? Treating diabetes or high blood pressure, if needed. ? Lowering your stress. Activities such as yoga and relaxation techniques can help.   General instructions  Pay attention to any changes in your symptoms. Tell your doctor about them or any new symptoms.  Avoid any activities that cause chest pain.  Keep all follow-up visits as told by your doctor. This is important. You may need more testing if your chest pain does not go away. Contact a doctor if:  Your chest pain does not go away.  You feel depressed.  You have a fever. Get help right away if:  Your chest pain is worse.  You have a cough that gets worse, or you cough up blood.  You have very bad (severe) pain in your belly (abdomen).  You pass out (faint).  You have either of these for no clear reason: ? Sudden chest discomfort. ? Sudden discomfort in your arms, back, neck, or jaw.  You have shortness of breath at any time.  You suddenly start to sweat, or your skin gets clammy.  You feel sick to your stomach (nauseous).  You throw up (vomit).  You suddenly feel lightheaded or dizzy.  You feel very weak or tired.  Your heart starts to beat fast, or it feels like it is skipping beats. These symptoms may be an emergency. Do not wait to see if the symptoms will go away. Get medical help right away. Call your local emergency services (911 in the [place] Do not drive yourself to the hospital. Summary  Chest pain can be caused by many different conditions. The cause may be serious and need treatment right away. If you have chest pain, see your doctor right away.  Follow your doctor's instructions for taking medicines and making lifestyle changes.  Keep all follow-up visits as told by your doctor. This includes visits for any further testing if your chest pain does not go away.  Be sure to know the signs that show that your condition has become worse.  Get help right away if you have these symptoms. This information is not intended to replace advice given to you by your health care provider. Make sure you discuss any questions you have with your health care provider. Document Revised: 05/30/2018 Document Reviewed: 05/30/2018 Elsevier Patient Education  2021 [employer].

## 2020-12-24 ENCOUNTER — Other Ambulatory Visit: Payer: Self-pay | Admitting: Adult Health

## 2020-12-24 DIAGNOSIS — J849 Interstitial pulmonary disease, unspecified: Secondary | ICD-10-CM

## 2020-12-24 DIAGNOSIS — R918 Other nonspecific abnormal finding of lung field: Secondary | ICD-10-CM

## 2020-12-24 LAB — CBC WITH DIFFERENTIAL/PLATELET
Absolute Monocytes: 251 cells/uL (ref 200–950)
Basophils Absolute: 40 cells/uL (ref 0–200)
Basophils Relative: 1.2 %
Eosinophils Absolute: 89 cells/uL (ref 15–500)
Eosinophils Relative: 2.7 %
HCT: 39.7 % (ref 35.0–45.0)
Hemoglobin: 12.9 g/dL (ref 11.7–15.5)
Lymphs Abs: 851 cells/uL (ref 850–3900)
MCH: 26.8 pg — ABNORMAL LOW (ref 27.0–33.0)
MCHC: 32.5 g/dL (ref 32.0–36.0)
MCV: 82.5 fL (ref 80.0–100.0)
MPV: 11.6 fL (ref 7.5–12.5)
Monocytes Relative: 7.6 %
Neutro Abs: 2069 cells/uL (ref 1500–7800)
Neutrophils Relative %: 62.7 %
Platelets: 251 10*3/uL (ref 140–400)
RBC: 4.81 10*6/uL (ref 3.80–5.10)
RDW: 14.1 % (ref 11.0–15.0)
Total Lymphocyte: 25.8 %
WBC: 3.3 10*3/uL — ABNORMAL LOW (ref 3.8–10.8)

## 2020-12-24 LAB — COMPLETE METABOLIC PANEL WITH GFR
AG Ratio: 1.8 (calc) (ref 1.0–2.5)
ALT: 7 U/L (ref 6–29)
AST: 16 U/L (ref 10–35)
Albumin: 4.4 g/dL (ref 3.6–5.1)
Alkaline phosphatase (APISO): 79 U/L (ref 37–153)
BUN: 16 mg/dL (ref 7–25)
CO2: 30 mmol/L (ref 20–32)
Calcium: 9.9 mg/dL (ref 8.6–10.4)
Chloride: 105 mmol/L (ref 98–110)
Creat: 0.65 mg/dL (ref 0.60–0.93)
GFR, Est African American: 101 mL/min/{1.73_m2} (ref >=60)
GFR, Est Non African American: 87 mL/min/{1.73_m2} (ref >=60)
Globulin: 2.4 g/dL (calc) (ref 1.9–3.7)
Glucose, Bld: 87 mg/dL (ref 65–99)
Potassium: 4.4 mmol/L (ref 3.5–5.3)
Sodium: 143 mmol/L (ref 135–146)
Total Bilirubin: 0.6 mg/dL (ref 0.2–1.2)
Total Protein: 6.8 g/dL (ref 6.1–8.1)

## 2020-12-24 LAB — LIPASE: Lipase: 33 U/L (ref 7–60)

## 2020-12-24 NOTE — Progress Notes (Signed)
Patient is aware of lab results. Says carafate made stomach hurt worse. -e welch

## 2020-12-29 ENCOUNTER — Other Ambulatory Visit: Payer: Self-pay | Admitting: Adult Health

## 2020-12-29 MED ORDER — FAMOTIDINE 20 MG PO TABS: ORAL_TABLET | ORAL | 0 refills | Status: DC

## 2021-01-11 ENCOUNTER — Other Ambulatory Visit: Payer: Self-pay

## 2021-01-11 ENCOUNTER — Encounter: Payer: Self-pay | Admitting: Adult Health Nurse Practitioner

## 2021-01-11 ENCOUNTER — Ambulatory Visit (INDEPENDENT_AMBULATORY_CARE_PROVIDER_SITE_OTHER): Payer: Medicare HMO | Admitting: Adult Health Nurse Practitioner

## 2021-01-11 ENCOUNTER — Ambulatory Visit
Admission: RE | Admit: 2021-01-11 | Discharge: 2021-01-11 | Disposition: A | Discharge status: Home / Self Care | Payer: Medicare HMO | Source: Ambulatory Visit | Attending: Adult Health | Admitting: Adult Health

## 2021-01-11 VITALS — BP 126/80 | HR 96 | Temp 97.5°F | Wt 154.6 lb

## 2021-01-11 DIAGNOSIS — R918 Other nonspecific abnormal finding of lung field: Secondary | ICD-10-CM

## 2021-01-11 DIAGNOSIS — R10816 Epigastric abdominal tenderness: Secondary | ICD-10-CM

## 2021-01-11 DIAGNOSIS — K219 Gastro-esophageal reflux disease without esophagitis: Secondary | ICD-10-CM

## 2021-01-11 DIAGNOSIS — J849 Interstitial pulmonary disease, unspecified: Secondary | ICD-10-CM

## 2021-01-11 MED ORDER — FAMOTIDINE 20 MG PO TABS
ORAL_TABLET | ORAL | 0 refills | Status: DC
Start: 2021-01-11 — End: 2021-02-23

## 2021-01-11 NOTE — Progress Notes (Signed)
Assessment and Plan:  Diagnoses and all orders for this visit: GERD Epigastric abdominal tenderness without rebound tenderness Some improvement but epigastric tenderness persistent Continue pantoprazole add famotidine. elevate HOB, lifestyle discussed at length, bland diet, no ETOH/NSAIDs, add carafate dissolved QID, can add pepcid AM if needed Can still try mylanta if breakthrough - Advised if this helps resolve, can r/o cardiac CT Scan today consider may need GI follow up with Dr. Collene Mares for repeat EGD and consider need for H. Pylori r/o by biopsy  Continued sucralfate (CARAFATE) 1 g tablet; Take 1 tab dissolved in 2-3 oz of water 4 times daily, 30 min prior to meals and prior to bedtime.  Further disposition pending results of labs. Discussed med's effects and SE's.   Over 30 minutes of face to face interview, exam, counseling, chart review, and critical decision making was performed.   Future Appointments  Date Time Provider [place]  01/11/2021  1:10 PM [facility] 1 [facility] [facility]  04/29/2021 11:00 AM Unk Pinto, MD [facility] None  06/27/2021  2:30 PM Tekeisha Hakim, Danton Sewer, NP [facility] None    ------------------------------------------------------------------------------------------------------------------   HPI BP 126/80   Pulse 96   Temp (!) 97.5 F (36.4 C)   Wt 154 lb 9.6 oz (70.1 kg)   SpO2 96%   BMI 25.34 kg/m   75 y.o.female with hx of htn, hyperlipidemia, GERD presents for evaluation of of episode of chest pain/tightness/burning.   She reports that things have improved.  She still remains burning that is intermittent.  She is not able to link this food intake or activity.  She is taking Carafate taking two tablets in the morning and one at night.  She continues Pantoprazole 40mg  in morning and once in evening.  She is also taking Mylanta as needed before she lays down.  She has been avoiding greasy foods.    From previous OV 12/24/19: She called  yesterday reporting chest tightness, chest pain "burning," feeling short of breath - was advise to try mylanta and present to the ED if sx didn't resolve. She presents today stating took prior to dinner and prior to bedtime without further episodes.   She reports these intermittent episodes began about 1 month ago, typically at night, 3 hours or so after sleeping will wake up with burning pain in her chest, tightness, foul taste in her mouth, drenching sweats, but will also have during the day and correlates with some exertion/lifting, but also may have with eating.   She also reports sensation of water/pills getting stuck in lower chest; known hx of small hiatal hernia per last EGD 03/21/2017 by Dr. Collene Mares, otherwise normal. Taking protonix 40 mg AM and endorses breakthrough reflux sx.   She does endorse fatigue, but this is ongoing over 1 year, not new   Notably had abnormal myoview 2015 but completely normal cath 2015, felt low risk CAD. She typically has well controlled BP and lipids.   She denies ETOH, NSAID use; do note on review has had recurrent "chest pain episodes" with unremarkable workups;    Past Medical History:  Diagnosis Date  . Depression 09/22/2017   09/21/2017 PHQ-9: 9  . DJD (degenerative joint disease)   . GERD (gastroesophageal reflux disease)   . Hyperlipidemia   . Hypertension   . Pre-diabetes   . RLS (restless legs syndrome)      Allergies  Allergen Reactions  . Escitalopram Itching  . Fosamax [Alendronate Sodium] Itching  . Levofloxacin     Headache,  chest pain, muscle aches  . Sulfa Antibiotics Itching  . Tramadol Itching    Current Outpatient Medications on File Prior to Visit  Medication Sig  . aspirin 81 MG tablet Take 81 mg by mouth every evening.   Marland Kitchen atenolol (TENORMIN) 50 MG tablet Take 1 tablet Daily at Suppertime for BP & Palpitations  . cholecalciferol (VITAMIN D) 1000 units tablet Take 5,000 Units by mouth daily.   . famotidine (PEPCID) 20 MG  tablet Take 1 tab daily prior to breakfast for stomach acid.  . pantoprazole (PROTONIX) 40 MG tablet Take 1 tablet daily for Indigestion & Heart burn  . psyllium (METAMUCIL) 58.6 % packet Take 1 packet by mouth daily.  . rosuvastatin (CRESTOR) 5 MG tablet Take 1 tablet daily for cholesterol.  . sucralfate (CARAFATE) 1 g tablet Take 1 tab dissolved in 2-3 oz of water 4 times daily, 30 min prior to meals and prior to bedtime.  Marland Kitchen telmisartan (MICARDIS) 80 MG tablet Take       1 tablet      Daily       for BP  . vitamin C (ASCORBIC ACID) 500 MG tablet Take 500 mg by mouth daily.   No current facility-administered medications on file prior to visit.    ROS: all negative except above.   Physical Exam:  BP 126/80   Pulse 96   Temp (!) 97.5 F (36.4 C)   Wt 154 lb 9.6 oz (70.1 kg)   SpO2 96%   BMI 25.34 kg/m   General Appearance: Well nourished, well dressed elderly AA female, in no apparent distress. Eyes: PERRLA, EOMs, conjunctiva no swelling or erythema Sinuses: No Frontal/maxillary tenderness ENT/Mouth: Ext aud canals clear, TMs without erythema, bulging. No erythema, swelling, or exudate on post pharynx.  Tonsils not swollen or erythematous. Hearing normal.  Neck: Supple, thyroid normal.  Respiratory: Respiratory effort normal, BS equal bilaterally without rales, rhonchi, wheezing or stridor.  Cardio: RRR with no MRGs. Brisk peripheral pulses without edema.  Abdomen: Soft, + BS.  + epigastric tenderness, no guarding, rebound, hernias, masses.  Lymphatics: Non tender without lymphadenopathy.  Musculoskeletal: Full ROM, 5/5 strength, normal gait. Non-tender through chest.  Skin: Warm, dry without rashes, lesions, ecchymosis.  Neuro: Cranial nerves intact. Normal muscle tone, no cerebellar symptoms. Sensation intact.  Psych: Awake and oriented X 3, normal affect, Insight and Judgment appropriate.     Garnet Sierras, NP 10:51 AM [facility]

## 2021-01-11 NOTE — Patient Instructions (Addendum)
   Continue taking Pantropazole (Protonix) 40mg  twice a day  Add Famotadine (Pepsid) 20mg  daily.  Continue Carafate, twice a day for now.  We will contact you with CT results from today.

## 2021-01-12 ENCOUNTER — Other Ambulatory Visit: Payer: Medicare HMO

## 2021-01-12 ENCOUNTER — Other Ambulatory Visit: Payer: Self-pay | Admitting: Adult Health

## 2021-01-12 DIAGNOSIS — E041 Nontoxic single thyroid nodule: Secondary | ICD-10-CM

## 2021-01-12 DIAGNOSIS — R918 Other nonspecific abnormal finding of lung field: Secondary | ICD-10-CM | POA: Insufficient documentation

## 2021-01-19 ENCOUNTER — Other Ambulatory Visit: Payer: Self-pay | Admitting: Internal Medicine

## 2021-01-19 DIAGNOSIS — I10 Essential (primary) hypertension: Secondary | ICD-10-CM

## 2021-01-19 DIAGNOSIS — R519 Headache, unspecified: Secondary | ICD-10-CM

## 2021-01-20 ENCOUNTER — Ambulatory Visit
Admission: RE | Admit: 2021-01-20 | Discharge: 2021-01-20 | Disposition: A | Discharge status: Home / Self Care | Payer: Medicare HMO | Source: Ambulatory Visit | Attending: Adult Health | Admitting: Adult Health

## 2021-01-20 ENCOUNTER — Other Ambulatory Visit: Payer: Self-pay

## 2021-01-20 DIAGNOSIS — E041 Nontoxic single thyroid nodule: Secondary | ICD-10-CM | POA: Diagnosis not present

## 2021-01-24 ENCOUNTER — Encounter: Payer: Self-pay | Admitting: Adult Health

## 2021-01-24 DIAGNOSIS — R9389 Abnormal findings on diagnostic imaging of other specified body structures: Secondary | ICD-10-CM | POA: Insufficient documentation

## 2021-01-24 DIAGNOSIS — D351 Benign neoplasm of parathyroid gland: Secondary | ICD-10-CM | POA: Insufficient documentation

## 2021-01-25 ENCOUNTER — Encounter: Payer: Self-pay | Admitting: Internal Medicine

## 2021-02-22 NOTE — Progress Notes (Unsigned)
MEDICARE ANNUAL WELLNESS VISIT AND FOLLOW UP  Assessment:   Diagnoses and all orders for this visit:  Encounter for Medicare annual wellness exam Due annually   Varicose veins of both lower extremities, unspecified whether complicated - weight loss discussed, continue compression stockings and elevation  Essential hypertension Continue medication;  Monitor blood pressure at home; call if consistently over 130/80 Continue DASH diet.   Reminder to go to the ER if any CP, SOB, nausea, dizziness, severe HA, changes vision/speech, left arm numbness and tingling and jaw pain.  Gastroesophageal reflux disease, esophagitis presence not specified Well managed on current medications; PPI taper plan discussed Discussed diet, avoiding triggers and other lifestyle changes  Age-related osteoporosis without current pathological fracture - get DEXA, didn't tolerate fosamax, but R hip T improved to osteopenia last check, aggressive lifestyle recommendations, plan to refer to endocrinology due to parathyroid and fosamax intolerance continue Vit D and Ca, weight bearing exercises reviewed  Vitamin D deficiency At goal at recent check; continue to recommend supplementation for goal of 60-100 Defer vitamin D level  Abnormal glucose Recent A1Cs at goal Discussed diet/exercise, weight management  Defer A1C; check CMP  Mixed hyperlipidemia Newly on rosuvastatin with LDL at goal  Continue low cholesterol diet and exercise.  Check lipid panel.   Medication management CBC, CMP/GFR, magnesium   Overweight (BMI 25.0-29.9) Long discussion about weight loss, diet, and exercise Recommended diet heavy in fruits and veggies and low in animal meats, cheeses, and dairy products, appropriate calorie intake Discussed appropriate weight for height and initial goal  Follow up at next visit  Knee arthritis Follow up Dr. Berenice Primas, getting injections  Pulmonary nodules Follow up scheduled  05/13/2021  Parathryoid enlarged vs adenoma With normal calcium but osteoporosis No hx of renal calculi Check PTH; follow up bone density; long discussion today; will plan to refer to endocrinology for review and recommendations.   Palpitations ? Anxiety r/t parathryoid, however with episode of dizziness/near syncope reported, will check holter   Over 40 minutes of exam, counseling, chart review and critical decision making was performed Future Appointments  Date Time Provider [place]  04/29/2021 11:00 AM Unk Pinto, MD [facility] None  05/13/2021 11:00 AM [facility] 1 [facility]  06/27/2021  3:30 PM Liane Comber, NP [facility] None  02/23/2022 11:30 AM Liane Comber, NP [facility] None    Plan:   During the course of the visit the patient was educated and counseled about appropriate screening and preventive services including:    Pneumococcal vaccine   Prevnar 13  Influenza vaccine  Td vaccine  Screening electrocardiogram  Bone densitometry screening  Colorectal cancer screening  Diabetes screening  Glaucoma screening  Nutrition counseling   Advanced directives: requested   Subjective:  Emily Jenkins is a 75 y.o. female who presents for Medicare Annual Wellness Visit and 3 month follow up.   Recently had CT chest in 01/2021 showing several ill defined nodules, was recommended 3 month follow up CT to r/o malignancy which is scheduled 05/13/2021.   Also incidentally noted mass in thyroid area; US showed 1.7 cm right inferior enlarged parathyroid versus parathyroid adenoma. Denies hx of renal stones, does have osteoporosis -DEX12/2019 - R hip T improved from -2.8 to -2.5. Her calcium has been normal.   Lab Results  Component Value Date   CALCIUM 9.9 12/23/2020   CAION 1.05 (L) 04/27/2008   She is followed by Dr. Lorin Mercy for bil knee arthritis, getting injections, needs surgery but has been deferring.  she has a diagnosis of GERD  which is currently managed by protonix 40 mg daily, PM, famotidine AM.  she reports symptoms is currently well controlled, and denies breakthrough reflux, burning in chest, hoarseness or cough.    BMI is Body mass index is 25.01 kg/m., she has been working on diet and exercise, walking up and down her driveway in the morning when cool, 15 min or so.  Wt Readings from Last 3 Encounters:  02/23/21 152 lb 9.6 oz (69.2 kg)  01/11/21 154 lb 9.6 oz (70.1 kg)  12/23/20 155 lb (70.3 kg)    Her blood pressure has been controlled at home (130/80 yesterday), today their BP is BP: 138/84 She does workout. She denies chest pain, shortness of breath. She does report interittent ongoing palpitations, reports ~15 min episodes, a few days ago had at night with dizziness and near syncope when got up to use restroom.   She is on cholesterol medication (rosuvastatin 5 mg daily and tolerating well) and denies myalgias. Her cholesterol is at goal. The cholesterol last visit was:   Lab Results  Component Value Date   CHOL 174 09/23/2020   HDL 73 09/23/2020   LDLCALC 86 09/23/2020   TRIG 63 09/23/2020   CHOLHDL 2.4 09/23/2020    She has not been working on diet and exercise for glucose management, and denies increased appetite, nausea, paresthesia of the feet, polydipsia, polyuria and visual disturbances. Last A1C in the office was:  Lab Results  Component Value Date   HGBA1C 5.8 (H) 09/23/2020   Last GFR: Lab Results  Component Value Date   GFRAA 101 12/23/2020   Patient is on Vitamin D supplement.   Lab Results  Component Value Date   VD25OH 76 09/23/2020       Medication Review: Current Outpatient Medications on File Prior to Visit  Medication Sig Dispense Refill  . aspirin 81 MG tablet Take 81 mg by mouth every evening.     Marland Kitchen atenolol (TENORMIN) 50 MG tablet Take 1 tablet Daily at Suppertime for BP & Palpitations 90 tablet 3  . cholecalciferol (VITAMIN D) 1000 units tablet Take 5,000 Units by  mouth daily.     . famotidine (PEPCID) 20 MG tablet Take 1 tab daily prior to breakfast for stomach acid. 90 tablet 0  . pantoprazole (PROTONIX) 40 MG tablet Take 1 tablet daily for Indigestion & Heart burn 90 tablet 3  . psyllium (METAMUCIL) 58.6 % packet Take 1 packet by mouth daily.    . rosuvastatin (CRESTOR) 5 MG tablet Take 1 tablet daily for cholesterol. 90 tablet 1  . telmisartan (MICARDIS) 80 MG tablet Take  1 tablet  Daily for  BP 90 tablet 0  . vitamin C (ASCORBIC ACID) 500 MG tablet Take 500 mg by mouth daily.     No current facility-administered medications on file prior to visit.    Allergies  Allergen Reactions  . Escitalopram Itching  . Fosamax [Alendronate Sodium] Itching  . Levofloxacin     Headache, chest pain, muscle aches  . Sulfa Antibiotics Itching  . Tramadol Itching    Current Problems (verified) Patient Active Problem List   Diagnosis Date Noted  . Abnormal ultrasound of parathyroid gland 01/24/2021  . Pulmonary nodules 01/12/2021  . Bilateral primary osteoarthritis of knee 08/20/2019  . Overweight (BMI 25.0-29.9) 10/29/2015  . Osteoporosis 06/07/2015  . GERD  03/04/2015  . Medication management 01/05/2014  . Essential hypertension 12/02/2013  . Mixed hyperlipidemia 12/02/2013  . Other  abnormal glucose (prediabetes) 12/02/2013  . Vitamin D deficiency 12/02/2013  . Varicose veins of both lower extremities 09/16/2012    Screening Tests Immunization History  Administered Date(s) Administered  . DTaP 12/11/2000  . Influenza Split 10/24/2013  . Influenza, High Dose Seasonal PF 09/17/2014, 09/09/2015, 08/21/2016, 09/21/2017, 10/14/2018, 10/13/2019, 09/23/2020  . Moderna Sars-Covid-2 Vaccination 01/18/2020, 02/18/2020  . Pneumococcal Conjugate-13 08/21/2016  . Pneumococcal Polysaccharide-23 12/11/2002, 01/08/2018  . Tdap 07/11/2012  . Zoster 08/06/2013   Prior vaccinations: TD or Tdap: 2013  Influenza: 2021 Pneumococcal: 2004, 2019 Prevnar 13:  2017 Shingles/Zostavax: 2014 Covid 19: 3/3, 2021, moderna    Last colonoscopy: 08/2020, Dr. Collene Mares, polyps were non-adenomatous, DONE   Last EGD: 03/2017 Last mammogram: 12/01/2020 Last pap smear/pelvic exam: pap 2014 DONE, pelvic 2017 DEXA: 2017, 11/2018 - R hip T improved from -2.8 to -2.5, fosamax intolerance (itching) - ordered to schedule follow up   Stress test 07/2014 Cath 07/2014 normal  Names of Other Physician/Practitioners you currently use: 1. [facility]- here for primary care 2. [facility] in [place], [place] doctor, last visit 2021, has scheduled May 2022.  3. Dr. Harrington Challenger, dentist, last visit 2022, still going q69m without issues  Patient Care Team: Unk Pinto, MD as PCP - General (Internal Medicine) Celestia Khat, [place] (Optometry) Shela Leff, MD as Consulting Physician (Orthopedic Surgery) Juanita Craver, MD as Consulting Physician (Gastroenterology) Burnell Blanks, MD as Consulting Physician (Cardiology)  SURGICAL HISTORY She  has a past surgical history that includes Cholecystectomy; Tubal ligation; Ankle surgery; and left heart catheterization with coronary angiogram (N/A, 07/29/2014). FAMILY HISTORY Her family history includes Cancer in her father; Diabetes in her father; Heart disease (age of onset: 40) in her mother; Hyperlipidemia in her sister; Hypertension in her mother and sister. SOCIAL HISTORY She  reports that she has never smoked. She has never used smokeless tobacco. She reports that she does not drink alcohol and does not use drugs.   MEDICARE WELLNESS OBJECTIVES: Physical activity: Current Exercise Habits: Home exercise routine, Type of exercise: walking, Time (Minutes): 15, Frequency (Times/Week): 1, Weekly Exercise (Minutes/Week): 15, Intensity: Mild, Exercise limited by: orthopedic condition(s) Cardiac risk factors: Cardiac Risk Factors include: advanced age (>57men, >38  women);dyslipidemia;hypertension;sedentary lifestyle Depression/mood screen:   Depression screen [facility] 2/9 02/23/2021  Decreased Interest 0  Down, Depressed, Hopeless 1  PHQ - 2 Score 1  Altered sleeping -  Tired, decreased energy -  Change in appetite -  Feeling bad or failure about yourself  -  Trouble concentrating -  Moving slowly or fidgety/restless -  Suicidal thoughts -  PHQ-9 Score -  Difficult doing work/chores -    ADLs:  In your present state of health, do you have any difficulty performing the following activities: 02/23/2021 09/23/2020  Hearing? N N  Vision? N N  Difficulty concentrating or making decisions? N N  Walking or climbing stairs? N N  Comment - -  Dressing or bathing? N N  Doing errands, shopping? N N  Some recent data might be hidden     Cognitive Testing  Alert? Yes  Normal Appearance?Yes  Oriented to person? Yes  Place? Yes   Time? Yes  Recall of three objects?  Yes  Can perform simple calculations? Yes  Displays appropriate judgment?Yes  Can read the correct time from a watch face?Yes  EOL planning: Does Patient Have a Medical Advance Directive?: Yes Type of Advance Directive: Erma will Does patient  want to make changes to medical advance directive?: No - Patient declined Copy of [place] in Chart?: Yes - validated most recent copy scanned in chart (See row information)   Review of Systems  Constitutional: Negative for malaise/fatigue and weight loss.  HENT: Negative for congestion, hearing loss, sore throat and tinnitus.   Eyes: Negative for blurred vision and double vision.  Respiratory: Negative for cough, sputum production, shortness of breath and wheezing.   Cardiovascular: Positive for palpitations (intermittent ~15 min episodes, reports recently had episode with dizziness). Negative for chest pain, orthopnea, claudication, leg swelling and PND.  Gastrointestinal: Negative for abdominal  pain, blood in stool, constipation, diarrhea, heartburn, melena, nausea and vomiting.  Genitourinary: Negative.   Musculoskeletal: Positive for joint pain (R knee). Negative for falls and myalgias.  Skin: Negative for rash.  Neurological: Negative for dizziness, tingling, sensory change, weakness and headaches.  Endo/Heme/Allergies: Positive for environmental allergies. Negative for polydipsia.  Psychiatric/Behavioral: Negative for depression, memory loss, substance abuse and suicidal ideas. The patient is not nervous/anxious and does not have insomnia (intermittent).   All other systems reviewed and are negative.    Objective:     Today's Vitals   02/23/21 1140  BP: 138/84  Pulse: 80  Temp: 97.7 F (36.5 C)  SpO2: 97%  Weight: 152 lb 9.6 oz (69.2 kg)   Body mass index is 25.01 kg/m.  General appearance: alert, no distress, WD/WN, female HEENT: normocephalic, sclerae anicteric, TMs pearly, nares patent, no discharge or erythema, pharynx normal Oral cavity: MMM, no lesions Neck: supple, no lymphadenopathy, no thyromegaly, no palpable masses Heart: RRR, normal S1, S2, no murmurs Lungs: CTA bilaterally, no wheezes, rhonchi, or rales Abdomen: +bs, soft, non tender, non distended, no masses, no hepatomegaly, no splenomegaly Musculoskeletal: nontender, no swelling, no obvious deformity Extremities: no edema, no cyanosis, no clubbing Pulses: 2+ symmetric, upper and lower extremities, normal cap refill Neurological: alert, oriented x 3, CN2-12 intact, strength normal upper extremities and lower extremities, sensation normal throughout, DTRs 2+ throughout, no cerebellar signs, gait antalgic, slow with cane  Psychiatric: normal affect, behavior normal, pleasant   Medicare Attestation I have personally reviewed: The patient's medical and social history Their use of alcohol, tobacco or illicit drugs Their current medications and supplements The patient's functional ability including  ADLs,fall risks, home safety risks, cognitive, and hearing and visual impairment Diet and physical activities Evidence for depression or mood disorders  The patient's weight, height, BMI, and visual acuity have been recorded in the chart.  I have made referrals, counseling, and provided education to the patient based on review of the above and I have provided the patient with a written personalized care plan for preventive services.     Izora Ribas, NP   02/23/2021

## 2021-02-23 ENCOUNTER — Encounter: Payer: Self-pay | Admitting: Adult Health

## 2021-02-23 ENCOUNTER — Encounter: Payer: Self-pay | Admitting: *Deleted

## 2021-02-23 ENCOUNTER — Other Ambulatory Visit: Payer: Self-pay

## 2021-02-23 ENCOUNTER — Ambulatory Visit (INDEPENDENT_AMBULATORY_CARE_PROVIDER_SITE_OTHER): Payer: Medicare HMO

## 2021-02-23 ENCOUNTER — Ambulatory Visit (INDEPENDENT_AMBULATORY_CARE_PROVIDER_SITE_OTHER): Payer: Medicare HMO | Admitting: Adult Health

## 2021-02-23 ENCOUNTER — Other Ambulatory Visit: Payer: Self-pay | Admitting: Adult Health

## 2021-02-23 VITALS — BP 138/84 | HR 80 | Temp 97.7°F | Wt 152.6 lb

## 2021-02-23 DIAGNOSIS — I10 Essential (primary) hypertension: Secondary | ICD-10-CM | POA: Diagnosis not present

## 2021-02-23 DIAGNOSIS — R6889 Other general symptoms and signs: Secondary | ICD-10-CM | POA: Diagnosis not present

## 2021-02-23 DIAGNOSIS — R9389 Abnormal findings on diagnostic imaging of other specified body structures: Secondary | ICD-10-CM | POA: Diagnosis not present

## 2021-02-23 DIAGNOSIS — I8393 Asymptomatic varicose veins of bilateral lower extremities: Secondary | ICD-10-CM

## 2021-02-23 DIAGNOSIS — R002 Palpitations: Secondary | ICD-10-CM

## 2021-02-23 DIAGNOSIS — E782 Mixed hyperlipidemia: Secondary | ICD-10-CM

## 2021-02-23 DIAGNOSIS — R918 Other nonspecific abnormal finding of lung field: Secondary | ICD-10-CM

## 2021-02-23 DIAGNOSIS — E663 Overweight: Secondary | ICD-10-CM

## 2021-02-23 DIAGNOSIS — K219 Gastro-esophageal reflux disease without esophagitis: Secondary | ICD-10-CM

## 2021-02-23 DIAGNOSIS — R7309 Other abnormal glucose: Secondary | ICD-10-CM | POA: Diagnosis not present

## 2021-02-23 DIAGNOSIS — E559 Vitamin D deficiency, unspecified: Secondary | ICD-10-CM

## 2021-02-23 DIAGNOSIS — Z79899 Other long term (current) drug therapy: Secondary | ICD-10-CM

## 2021-02-23 DIAGNOSIS — Z0001 Encounter for general adult medical examination with abnormal findings: Secondary | ICD-10-CM | POA: Diagnosis not present

## 2021-02-23 DIAGNOSIS — M170 Bilateral primary osteoarthritis of knee: Secondary | ICD-10-CM

## 2021-02-23 DIAGNOSIS — Z0000 Encounter for general adult medical examination without abnormal findings: Secondary | ICD-10-CM

## 2021-02-23 DIAGNOSIS — D72819 Decreased white blood cell count, unspecified: Secondary | ICD-10-CM | POA: Diagnosis not present

## 2021-02-23 DIAGNOSIS — M810 Age-related osteoporosis without current pathological fracture: Secondary | ICD-10-CM | POA: Diagnosis not present

## 2021-02-23 MED ORDER — FAMOTIDINE 20 MG PO TABS: ORAL_TABLET | ORAL | 1 refills | Status: DC

## 2021-02-23 NOTE — Patient Instructions (Addendum)
[employer] with no obligation # [phone] Do not have to be a member Tues-Sat 10-6  [place]- free test with no obligation # [phone] MUST BE A MEMBER Call for store hours  Have had patient's get good cheaper hearing aids from [employer] The air version has good reviews.       Parathyroid Hormone Test Why am I having this test? The parathyroid hormone (PTH) test may be used to evaluate the function of your parathyroid glands and to check for or monitor conditions that affect the calcium levels in your body. The parathyroid glands are four tiny glands that surround the larger thyroid gland in your neck. When your blood calcium levels are low, your parathyroid glands release PTH into the blood. This causes several changes in your body that result in an increase in your blood calcium level. When blood calcium levels are normal or too high, blood PTH levels decrease. Your health care provider may want you to have this test if you have:  Signs of higher-than-normal blood calcium levels (hypercalcemia).  Signs of lower-than-normal blood calcium levels (hypocalcemia).  A kidney infection.  Kidney disease.  Certain cancer cells that secrete PTH. What is being tested? This test measures the amount of PTH in your blood. What kind of sample is taken? A blood sample is required for this test. It is usually collected by inserting a needle into a blood vessel or by sticking a finger with a small needle.   How do I prepare for this test? Do not eat or drink anything after midnight on the night before the test or as told by your health care provider. How are the results reported? Your test results will be reported as values that indicate the amount of PTH in your blood. Your health care provider will compare your results to normal ranges that were established after testing a large group of people (reference ranges). The results may include amounts for  whole PTH and its fragments (PTH N-terminal and PTH C-terminal). Reference ranges may vary among labs and hospitals. For this test, common reference ranges are:  PTH intact (whole): 10-65 pg/mL or 10-65 ng/L (SI units).  PTH N-terminal: 8-24 pg/mL or 8-24 ng/L (SI units).  PTH C-terminal: 50-330 pg/mL or 50-330 ng/L (SI units). What do the results mean? Test results that are higher than the reference range may mean that you have:  Overactive parathyroid glands (hyperparathyroidism).  A non-parathyroid PTH-producing tumor.  A kidney defect that is present at birth (congenital).  Hypocalcemia.  Long-term (chronic) kidney failure.  Malabsorption syndrome. This means that your body does not properly absorb nutrients such as calcium from the intestinal tract.  Vitamin D deficiency. Adequate vitamin D levels are required for calcium to be absorbed from the intestinal tract. Vitamin D deficiency can lead to low levels of calcium in the blood. Test results that are lower than the reference range may indicate any of the following:  Underactive parathyroid glands (hypoparathyroidism).  Hypercalcemia.  Bone tumor.  A disease that causes inflammation in your organs and other areas of your body (sarcoidosis).  Vitamin D intoxication.  Milk-alkali syndrome.  An immune system disorder (DiGeorge syndrome). Talk with your health care provider about what your results mean. Questions to ask your health care provider Ask your health care provider, or the department that is doing the test:  When will my results be ready?  How will I get my results?  What are my treatment options?  What other tests do I need?  What are my next steps? Summary  The parathyroid hormone (PTH) test may be used to evaluate the function of your parathyroid glands and to check for or monitor conditions that affect the calcium levels in your body.  The parathyroid glands are four tiny glands that surround the  larger thyroid gland in your neck.  When your blood calcium levels are low, your parathyroid glands release PTH into the blood.  Talk with your health care provider about what your results mean. This information is not intended to replace advice given to you by your health care provider. Make sure you discuss any questions you have with your health care provider. Document Revised: 08/12/2020 Document Reviewed: 08/12/2020 Elsevier Patient Education  2021 [place] OFF OF PPI's    Nexium/protonix/prilosec/Omeprazole/Dexilant/Aciphex are called PPI's, they are great at healing your stomach but should only be taken for a short period of time.     Recent studies have shown that taken for a long time they  can increase the risk of osteoporosis (weakening of your bones), pneumonia, low magnesium, restless legs, Cdiff (infection that causes diarrhea), DEMENTIA and most recently kidney damage / disease / insufficiency.     Due to this information we want to try to stop the PPI but if you try to stop it abruptly this can cause rebound acid and worsening symptoms.   So this is how we want you to get off the PPI: Generic is always fine!!  - Start taking the nexium/protonix/prilosec/PPI  every other day with  pepcid famotadine 2 x a day for 2-4 weeks - some people stay on this dosage and can not taper off further. Our main goal is to limit the dosage and amount you are taking so if you need to stay on this dose.   - then decrease the PPI to every 3rd day while taking the pepcid twice a day the other  days for 2-4  Weeks  - then you can try the pepcid 2 x day as needed.  - you can continue on this once at night or stop all together  - Avoid alcohol, spicy foods, NSAIDS (aleve, ibuprofen) at this time. See foods below.   +++++++++++++++++++++++++++++++++++++++++++  Food Choices for Gastroesophageal Reflux Disease  When you have gastroesophageal reflux disease (GERD), the foods  you eat and your eating habits are very important. Choosing the right foods can help ease the discomfort of GERD. WHAT GENERAL GUIDELINES DO I NEED TO FOLLOW?  Choose fruits, vegetables, whole grains, low-fat dairy products, and low-fat meat, fish, and poultry.  Limit fats such as oils, salad dressings, butter, nuts, and avocado.  Keep a food diary to identify foods that cause symptoms.  Avoid foods that cause reflux. These may be different for different people.  Eat frequent small meals instead of three large meals each day.  Eat your meals slowly, in a relaxed setting.  Limit fried foods.  Cook foods using methods other than frying.  Avoid drinking alcohol.  Avoid drinking large amounts of liquids with your meals.  Avoid bending over or lying down until 2-3 hours after eating.   WHAT FOODS ARE NOT RECOMMENDED? The following are some foods and drinks that may worsen your symptoms:  Vegetables Tomatoes. Tomato juice. Tomato and spaghetti sauce. Chili peppers. Onion and garlic. Horseradish. Fruits Oranges, grapefruit, and lemon (fruit and juice). Meats High-fat meats, fish, and poultry. This includes hot dogs, ribs, ham, sausage,  salami, and bacon. Dairy Whole milk and chocolate milk. Sour cream. Cream. Butter. Ice cream. Cream cheese.  Beverages Coffee and tea, with or without caffeine. Carbonated beverages or energy drinks. Condiments Hot sauce. Barbecue sauce.  Sweets/Desserts Chocolate and cocoa. Donuts. Peppermint and spearmint. Fats and Oils High-fat foods, including [place] fries and potato chips. Other Vinegar. Strong spices, such as black pepper, white pepper, red pepper, cayenne, curry powder, cloves, ginger, and chili powder.

## 2021-02-23 NOTE — Progress Notes (Signed)
Patient ID: Emily Jenkins, female   DOB: [birth year], 75 y.o.   MRN: [id number] Patient enrolled for [employer] to ship a 3 day ZIO XT long term holter monitor to her home. Letter with instructions mailed to patient.

## 2021-02-25 LAB — CBC WITH DIFFERENTIAL/PLATELET
Absolute Monocytes: 220 cells/uL (ref 200–950)
Basophils Absolute: 31 cells/uL (ref 0–200)
Basophils Relative: 1 %
Eosinophils Absolute: 71 cells/uL (ref 15–500)
Eosinophils Relative: 2.3 %
HCT: 40.9 % (ref 35.0–45.0)
Hemoglobin: 13.4 g/dL (ref 11.7–15.5)
Lymphs Abs: 794 cells/uL — ABNORMAL LOW (ref 850–3900)
MCH: 26.6 pg — ABNORMAL LOW (ref 27.0–33.0)
MCHC: 32.8 g/dL (ref 32.0–36.0)
MCV: 81.2 fL (ref 80.0–100.0)
MPV: 11.6 fL (ref 7.5–12.5)
Monocytes Relative: 7.1 %
Neutro Abs: 1984 cells/uL (ref 1500–7800)
Neutrophils Relative %: 64 %
Platelets: 240 10*3/uL (ref 140–400)
RBC: 5.04 10*6/uL (ref 3.80–5.10)
RDW: 13.9 % (ref 11.0–15.0)
Total Lymphocyte: 25.6 %
WBC: 3.1 10*3/uL — ABNORMAL LOW (ref 3.8–10.8)

## 2021-02-25 LAB — COMPLETE METABOLIC PANEL WITH GFR
AG Ratio: 1.7 (calc) (ref 1.0–2.5)
ALT: 8 U/L (ref 6–29)
AST: 18 U/L (ref 10–35)
Albumin: 4.5 g/dL (ref 3.6–5.1)
Alkaline phosphatase (APISO): 88 U/L (ref 37–153)
BUN: 10 mg/dL (ref 7–25)
CO2: 29 mmol/L (ref 20–32)
Calcium: 10.3 mg/dL (ref 8.6–10.4)
Chloride: 105 mmol/L (ref 98–110)
Creat: 0.65 mg/dL (ref 0.60–0.93)
GFR, Est African American: 101 mL/min/{1.73_m2} (ref >=60)
GFR, Est Non African American: 87 mL/min/{1.73_m2} (ref >=60)
Globulin: 2.6 g/dL (calc) (ref 1.9–3.7)
Glucose, Bld: 96 mg/dL (ref 65–99)
Potassium: 3.9 mmol/L (ref 3.5–5.3)
Sodium: 143 mmol/L (ref 135–146)
Total Bilirubin: 0.6 mg/dL (ref 0.2–1.2)
Total Protein: 7.1 g/dL (ref 6.1–8.1)

## 2021-02-25 LAB — MAGNESIUM: Magnesium: 2 mg/dL (ref 1.5–2.5)

## 2021-02-25 LAB — PARATHYROID HORMONE, INTACT (NO CA): PTH: 46 pg/mL (ref 16–77)

## 2021-02-25 LAB — TSH: TSH: 1.56 mIU/L (ref 0.40–4.50)

## 2021-02-25 LAB — TEST AUTHORIZATION

## 2021-02-25 LAB — LIPID PANEL
Cholesterol: 172 mg/dL (ref <200)
HDL: 76 mg/dL (ref >=50)
LDL Cholesterol (Calc): 83 mg/dL (calc)
Non-HDL Cholesterol (Calc): 96 mg/dL (calc) (ref <130)
Total CHOL/HDL Ratio: 2.3 (calc) (ref <5.0)
Triglycerides: 51 mg/dL (ref <150)

## 2021-02-25 LAB — PATHOLOGIST SMEAR REVIEW

## 2021-02-25 LAB — HEMOGLOBIN A1C
Hgb A1c MFr Bld: 5.7 % of total Hgb — ABNORMAL HIGH (ref <5.7)
Mean Plasma Glucose: 117 mg/dL
eAG (mmol/L): 6.5 mmol/L

## 2021-02-27 DIAGNOSIS — R002 Palpitations: Secondary | ICD-10-CM | POA: Diagnosis not present

## 2021-03-04 DIAGNOSIS — M8589 Other specified disorders of bone density and structure, multiple sites: Secondary | ICD-10-CM | POA: Diagnosis not present

## 2021-03-04 DIAGNOSIS — Z780 Asymptomatic menopausal state: Secondary | ICD-10-CM | POA: Diagnosis not present

## 2021-03-04 LAB — HM DEXA SCAN

## 2021-03-07 DIAGNOSIS — R002 Palpitations: Secondary | ICD-10-CM | POA: Diagnosis not present

## 2021-03-08 ENCOUNTER — Encounter: Payer: Self-pay | Admitting: Internal Medicine

## 2021-03-09 ENCOUNTER — Other Ambulatory Visit: Payer: Self-pay | Admitting: Adult Health

## 2021-03-09 DIAGNOSIS — M810 Age-related osteoporosis without current pathological fracture: Secondary | ICD-10-CM

## 2021-03-09 DIAGNOSIS — R9389 Abnormal findings on diagnostic imaging of other specified body structures: Secondary | ICD-10-CM

## 2021-03-10 ENCOUNTER — Encounter: Payer: Self-pay | Admitting: Adult Health

## 2021-03-10 DIAGNOSIS — R002 Palpitations: Secondary | ICD-10-CM | POA: Insufficient documentation

## 2021-03-10 DIAGNOSIS — I471 Supraventricular tachycardia: Secondary | ICD-10-CM | POA: Insufficient documentation

## 2021-03-11 ENCOUNTER — Other Ambulatory Visit: Payer: Self-pay

## 2021-03-11 DIAGNOSIS — I10 Essential (primary) hypertension: Secondary | ICD-10-CM

## 2021-03-11 MED ORDER — ATENOLOL 50 MG PO TABS: ORAL_TABLET | ORAL | 3 refills | Status: DC

## 2021-03-15 ENCOUNTER — Encounter: Payer: Self-pay | Admitting: Internal Medicine

## 2021-03-25 ENCOUNTER — Ambulatory Visit: Payer: Medicare HMO

## 2021-04-19 ENCOUNTER — Telehealth: Payer: Self-pay | Admitting: Internal Medicine

## 2021-04-19 NOTE — Telephone Encounter (Signed)
error 

## 2021-04-28 ENCOUNTER — Ambulatory Visit: Payer: Medicare HMO | Admitting: Internal Medicine

## 2021-04-29 ENCOUNTER — Encounter: Payer: Self-pay | Admitting: Internal Medicine

## 2021-04-29 ENCOUNTER — Ambulatory Visit (INDEPENDENT_AMBULATORY_CARE_PROVIDER_SITE_OTHER): Payer: Medicare HMO | Admitting: Internal Medicine

## 2021-04-29 ENCOUNTER — Other Ambulatory Visit: Payer: Self-pay

## 2021-04-29 VITALS — BP 148/80 | HR 88 | Ht 65.5 in | Wt 154.1 lb

## 2021-04-29 DIAGNOSIS — E042 Nontoxic multinodular goiter: Secondary | ICD-10-CM

## 2021-04-29 NOTE — Patient Instructions (Addendum)
-   Your thyroid nodules, these are benign looking and require a repeat ultrasound in 6 months  . If you notice a sudden enlergement in the front of the neck , please bring this to our attention sooner

## 2021-04-29 NOTE — Progress Notes (Signed)
Name: Emily Jenkins  MRN/ DOB: [id number], 1946/01/29    Age/ Sex: 75 y.o., female    PCP: Unk Pinto, MD   Reason for Endocrinology Evaluation: MNG     Date of Initial Endocrinology Evaluation: 04/29/2021        HPI: Emily Jenkins is a 75 y.o. female with a past medical history of HTN, SVT, Osteoporosis and mixed hyperlipidemia. The patient presented for initial endocrinology clinic visit on 04/29/2021 for consultative assistance with her MNG   She was noted to have a right inferior thyroid nodule on CT scan of the chest in 01/2021. This promoted a thyroid ultrasound demonstrating a questionable right parathyroid adenoma vs thyroid nodule. She also has sub-centimeter thyroid nodules    Of note the pt has normal calcium and parathyroid hormone level.   She has non specific upper neck pain, and occasional dysphagia   No prior exposure to radiation  No biotin intake   Has chronic constipation  Has dry mouth and dry skin Mild stress with family issues    No FH of thyroid disease     HISTORY:  Past Medical History:  Past Medical History:  Diagnosis Date  . Depression 09/22/2017   09/21/2017 PHQ-9: 9  . DJD (degenerative joint disease)   . GERD (gastroesophageal reflux disease)   . Hyperlipidemia   . Hypertension   . Pre-diabetes   . RLS (restless legs syndrome)    Past Surgical History:  Past Surgical History:  Procedure Laterality Date  . ANKLE SURGERY    . CHOLECYSTECTOMY    . LEFT HEART CATHETERIZATION WITH CORONARY ANGIOGRAM N/A 07/29/2014   Procedure: LEFT HEART CATHETERIZATION WITH CORONARY ANGIOGRAM;  Surgeon: Burnell Blanks, MD;  Location: [facility] CATH LAB;  Service: Cardiovascular;  Laterality: N/A;  . TUBAL LIGATION        Social History:  reports that she has never smoked. She has never used smokeless tobacco. She reports that she does not drink alcohol and does not use drugs.  Family History: family history includes Cancer  in her father; Diabetes in her father; Heart disease (age of onset: 63) in her mother; Hyperlipidemia in her sister; Hypertension in her mother and sister.   HOME MEDICATIONS: Allergies as of 04/29/2021      Reactions   Escitalopram Itching   Fosamax [alendronate Sodium] Itching   Levofloxacin    Headache, chest pain, muscle aches   Sulfa Antibiotics Itching   Tramadol Itching      Medication List       Accurate as of Apr 29, 2021  2:09 PM. If you have any questions, ask your nurse or doctor.        STOP taking these medications   atenolol 50 MG tablet Commonly known as: Tenormin Stopped by: Dorita Sciara, MD   pantoprazole 40 MG tablet Commonly known as: PROTONIX Stopped by: Dorita Sciara, MD     TAKE these medications   aspirin 81 MG tablet Take 81 mg by mouth every evening.   cholecalciferol 1000 units tablet Commonly known as: VITAMIN D Take 5,000 Units by mouth daily.   famotidine 20 MG tablet Commonly known as: PEPCID Take 1 tab twice daily prior to breakfast and dinner for stomach acid as directed.   psyllium 58.6 % packet Commonly known as: METAMUCIL Take 1 packet by mouth daily.   rosuvastatin 5 MG tablet Commonly known as: CRESTOR Take 1 tablet daily for cholesterol.   telmisartan 80 MG tablet  Commonly known as: MICARDIS Take  1 tablet  Daily for  BP What changed:   how much to take  how to take this  when to take this  additional instructions   vitamin C 500 MG tablet Commonly known as: ASCORBIC ACID Take 500 mg by mouth daily.         REVIEW OF SYSTEMS: A comprehensive ROS was conducted with the patient and is negative except as per HPI and below:  ROS     OBJECTIVE:  VS: BP (!) 148/80   Pulse 88   Ht 5' 5.5" (1.664 m)   Wt 154 lb 2 oz (69.9 kg)   SpO2 98%   BMI 25.26 kg/m    Wt Readings from Last 3 Encounters:  04/29/21 154 lb 2 oz (69.9 kg)  02/23/21 152 lb 9.6 oz (69.2 kg)  01/11/21 154 lb 9.6 oz  (70.1 kg)     EXAM: General: Pt appears well and is in NAD  Neck: General: Supple without adenopathy. Thyroid: Thyroid size normal.  No goiter or nodules appreciated. No thyroid bruit.  Lungs: Clear with good BS bilat with no rales, rhonchi, or wheezes  Heart: Auscultation: RRR.  Abdomen: Normoactive bowel sounds, soft, nontender, without masses or organomegaly palpable  Extremities:  BL LE: No pretibial edema normal ROM and strength.  Skin: Hair: Texture and amount normal with gender appropriate distribution Skin Inspection: No rashes Skin Palpation: Skin temperature, texture, and thickness normal to palpation  Neuro: Cranial nerves: II - XII grossly intact  Motor: Normal strength throughout DTRs: 2+ and symmetric in UE without delay in relaxation phase  Mental Status: Judgment, insight: Intact Orientation: Oriented to time, place, and person Mood and affect: No depression, anxiety, or agitation     DATA REVIEWED:  Results for Emily Jenkins, Emily Jenkins (MRN [id number]) as of 04/29/2021 09:49  Ref. Range 02/23/2021 12:39  PTH, Intact Latest Ref Range: 16 - 77 pg/mL 46  TSH Latest Ref Range: 0.40 - 4.50 mIU/L 1.56   Thyroid Ultrasound 01/20/2021  FINDINGS: Parenchymal Echotexture: Mildly heterogenous  Isthmus: 2 mm  Right lobe: 3.9 x 2.0 x 1.7 cm  Left lobe: 4.5 x 1.4 x 1.4 cm  _________________________________________________________  Estimated total number of nodules >/= 1 cm: 1  Number of spongiform nodules >/=  2 cm not described below (TR1): 0  Number of mixed cystic and solid nodules >/= 1.5 cm not described below (TR2): 0  _________________________________________________________  Along the right inferior thyroid posteriorly, there is a solid oval hypoechoic nodule which appears extrathyroidal measuring 1.7 x 1.5 x 0.9 cm. This has the appearance of an enlarged parathyroid gland or adenoma. This nodule correlates with the chest CT finding.  Within the  thyroid there are a few scattered subcentimeter hypoechoic and isoechoic nodules noted, all measuring 8 mm or less in size. These would not meet criteria for any biopsy or follow-up and are not fully described by TI rads criteria.  No hypervascularity.  No regional adenopathy.  IMPRESSION: 1.7 cm right inferior enlarged parathyroid versus parathyroid adenoma. Recommend correlation with calcium and PTH levels for hyperparathyroidism.  ASSESSMENT/PLAN/RECOMMENDATIONS:   1. MNG:   -Patient is clinically and biochemically euthyroid -She has no local neck symptoms -Thyroid ultrasound shows multinodular goiter but none meets criteria for FNA at this time   2. Questionable Parathyroid adenoma :   -I have personally reviewed the images and it seems  the right posterior nodule in question is embedded within the right lobe, but this  is difficult to say with certainty  -The patient has normal  calcium and PTH levels.  -Her osteoporosis is not linked to her parathyroid given that these parameters are normal, related to her gender/age/postmenopausal status. -I would consider repeating thyroid ultrasound in 6 months, and continue to monitor serum calcium and PTH periodically -Even if the patient has a parathyroid adenoma there is no clinical indication for surgery  Follow-up in 6 months   Signed electronically by: Mack Guise, MD  [facility]  [address] [place], [place] [address] Phone: [phone] FAX: [phone]   CC: Unk Pinto, [address] [place] [address] Phone: [phone] Fax: [phone]   Return to Endocrinology clinic as below: Future Appointments  Date Time Provider [place]  05/13/2021 11:00 AM [facility] 1 [facility]  05/30/2021 11:00 AM Unk Pinto, MD [facility] None  09/01/2021 11:00 AM Liane Comber, NP [facility] None  10/31/2021 10:50  AM Isidore Margraf, Melanie Crazier, MD [facility] None  02/23/2022 11:30 AM Liane Comber, NP [facility] None

## 2021-05-13 ENCOUNTER — Ambulatory Visit
Admission: RE | Admit: 2021-05-13 | Discharge: 2021-05-13 | Disposition: A | Discharge status: Home / Self Care | Payer: Medicare HMO | Source: Ambulatory Visit | Attending: Adult Health | Admitting: Adult Health

## 2021-05-13 DIAGNOSIS — R918 Other nonspecific abnormal finding of lung field: Secondary | ICD-10-CM

## 2021-05-14 ENCOUNTER — Encounter: Payer: Self-pay | Admitting: Adult Health

## 2021-05-14 DIAGNOSIS — I700 Atherosclerosis of aorta: Secondary | ICD-10-CM | POA: Insufficient documentation

## 2021-05-29 ENCOUNTER — Encounter: Payer: Self-pay | Admitting: Internal Medicine

## 2021-05-29 NOTE — Progress Notes (Signed)
Annual Screening/Preventative Visit & Comprehensive Evaluation &  Examination  Future Appointments  Date Time Provider [place]  05/30/2021 11:00 AM Unk Pinto, MD [facility] None  09/01/2021  -    11:00 AM Liane Comber, NP [facility] None  10/31/2021 10:50 AM Shamleffer, Melanie Crazier, MD [facility] None  02/23/2022  -  Wellness 11:30 AM Liane Comber, NP [facility] None  05/30/2022  - CPE 11:00 AM Unk Pinto, MD [facility] None        This very nice 75 y.o. [name] presents for a Screening /Preventative Visit & comprehensive evaluation and management of multiple medical co-morbidities.  Patient has been followed for HTN, HLD, Prediabetes, MNG  and Vitamin D Deficiency.  Chest CT scan on 05/13/2021 showed Aortic Atherosclerosis.  Patient's GERD is controlled on her meds.           Patient has a MNG with nl calcium & PTH levels , Osteoporosis improved to Osteopenia and Chest CT scan & Thyroid U/s raised question of / Thyroid nodule vs Parathyroid Adenoma and patient had consultation by Dr Kelton Pillar in May who recommended continued periodic monitoring of calcium & PTH levels and repeating Thyroid U/S in 6 months.         HTN predates since   the early 1990's.  Patient's BP has been controlled at home and patient denies any cardiac symptoms as chest pain, palpitations, shortness of breath, dizziness or ankle swelling. Today's BP is at goal -  136/76 .       Patient's hyperlipidemia is controlled with diet and Rosuvastatin. Patient denies myalgias or other medication SE's. Last lipids were at goal:  Lab Results  Component Value Date   CHOL 172 02/23/2021   HDL 76 02/23/2021   LDLCALC 83 02/23/2021   TRIG 51 02/23/2021   CHOLHDL 2.3 02/23/2021         Patient has hx/o prediabetes (A1c 5.9% /2013) and patient denies reactive hypoglycemic symptoms, visual blurring, diabetic polys or paresthesias. Last A1c was near goal:  Lab Results  Component Value Date    HGBA1C 5.7 (H) 02/23/2021         Finally, patient has history of Vitamin D Deficiency  ("41" on treatment /2014) and last Vitamin D was at goal:  Lab Results  Component Value Date   VD25OH 76 09/23/2020     Current Outpatient Medications on File Prior to Visit  Medication Sig   aspirin 81 MG tablet Take every evening.    cholecalciferol (VITAMIN D) 1000 units tablet Take 5,000 Units daily.    famotidine (PEPCID) 20 MG tablet Take 1 tab twice daily prior to breakfast and dinner for stomach acid as directed.   psyllium (METAMUCIL) 58.6 % packet Take 1 packet daily.   rosuvastatin (CRESTOR) 5 MG tablet Take 1 tablet daily for cholesterol.   telmisartan (MICARDIS) 80 MG tablet Take  1 tablet  Daily for  BP (Patient taking differently: Take 80 mg daily. Take  1/2 tablet  Daily for  BP)   vitamin C (ASCORBIC ACID) 500 MG tablet Take 500 mg by mouth daily.     Allergies  Allergen Reactions   Escitalopram Itching   Fosamax [Alendronate Sodium] Itching   Levofloxacin     Headache, chest pain, muscle aches   Sulfa Antibiotics Itching   Tramadol Itching     Past Medical History:  Diagnosis Date   Depression 09/22/2017   09/21/2017 PHQ-9: 9   DJD (degenerative joint disease)    GERD (gastroesophageal reflux disease)  Hyperlipidemia    Hypertension    Pre-diabetes    RLS (restless legs syndrome)      Health Maintenance  Topic Date Due   Zoster Vaccines- Shingrix (1 of 2) Never done   COVID-19 Vaccine (4 - Booster for Moderna series) 02/07/2021   INFLUENZA VACCINE  07/11/2021   MAMMOGRAM  12/01/2021   TETANUS/TDAP  07/11/2022   COLONOSCOPY (Pts 45-22yrs Insurance coverage will need to be confirmed)  09/03/2030   DEXA SCAN  Completed   Hepatitis C Screening  Completed   PNA vac Low Risk Adult  Completed   HPV VACCINES  Aged Out     Immunization History  Administered Date(s) Administered   DTaP 12/11/2000   Influenza Split 10/24/2013   Influenza, High Dose   10/14/2018, 10/13/2019, 09/23/2020   Moderna Sars-Covid-2 Vacc 01/18/2020, 02/18/2020, 11/09/2020   Pneumococcal -13 08/21/2016   Pneumococcal -23 12/11/2002, 01/08/2018   Tdap 07/11/2012   Zoster, Live 08/06/2013    Last Colon -  09/03/2020 - Dr Verdia Kuba - Recc No F/U  due to age  Last MGM - 12/01/2020  Last Dexa BMD - 03/01/2021 - showed Osteopenia - improved    Past Surgical History:  Procedure Laterality Date   ANKLE SURGERY     CHOLECYSTECTOMY     LEFT HEART CATHETERIZATION WITH CORONARY ANGIOGRAM N/A 07/29/2014   Procedure: LEFT HEART CATHETERIZATION WITH CORONARY ANGIOGRAM;  Surgeon: Burnell Blanks, MD;  Location: [facility] CATH LAB;  Service: Cardiovascular;  Laterality: N/A;   TUBAL LIGATION       Family History  Problem Relation Age of Onset   Heart disease Mother 65       stent   Hypertension Mother    Cancer Father    Diabetes Father    Hyperlipidemia Sister    Hypertension Sister      Social History   Tobacco Use   Smoking status: Never   Smokeless tobacco: Never   Tobacco comments:    SPOUSE SMOKES  Substance Use Topics   Alcohol use: No    Alcohol/week: 0.0 standard drinks   Drug use: No      ROS Constitutional: Denies fever, chills, weight loss/gain, headaches, insomnia,  night sweats, and change in appetite. Does c/o fatigue. Eyes: Denies redness, blurred vision, diplopia, discharge, itchy, watery eyes.  ENT: Denies discharge, congestion, post nasal drip, epistaxis, sore throat, earache, hearing loss, dental pain, Tinnitus, Vertigo, Sinus pain, snoring.  Cardio: Denies chest pain, palpitations, irregular heartbeat, syncope, dyspnea, diaphoresis, orthopnea, PND, claudication, edema Respiratory: denies cough, dyspnea, DOE, pleurisy, hoarseness, laryngitis, wheezing.  Gastrointestinal: Denies dysphagia, heartburn, reflux, water brash, pain, cramps, nausea, vomiting, bloating, diarrhea, constipation, hematemesis, melena, hematochezia, jaundice,  hemorrhoids Genitourinary: Denies dysuria, frequency, urgency, nocturia, hesitancy, discharge, hematuria, flank pain Breast: Breast lumps, nipple discharge, bleeding.  Musculoskeletal: Denies arthralgia, myalgia, stiffness, Jt. Swelling, pain, limp, and strain/sprain. Denies falls. Skin: Denies puritis, rash, hives, warts, acne, eczema, changing in skin lesion Neuro: No weakness, tremor, incoordination, spasms, paresthesia, pain Psychiatric: Denies confusion, memory loss, sensory loss. Denies Depression. Endocrine: Denies change in weight, skin, hair change, nocturia, and paresthesia, diabetic polys, visual blurring, hyper / hypo glycemic episodes.  Heme/Lymph: No excessive bleeding, bruising, enlarged lymph nodes.  Physical Exam  BP 136/76   Pulse 78   Temp (!) 97.3 F (36.3 C)   Resp 17   Ht 5\' 5"  (1.651 m)   Wt 155 lb 6.4 oz (70.5 kg)   SpO2 98%   BMI 25.86 kg/m  General Appearance: Well nourished, well groomed and in no apparent distress.  Eyes: PERRLA, EOMs, conjunctiva no swelling or erythema, normal fundi and vessels. Sinuses: No frontal/maxillary tenderness ENT/Mouth: EACs patent / TMs  nl. Nares clear without erythema, swelling, mucoid exudates. Oral hygiene is good. No erythema, swelling, or exudate. Tongue normal, non-obstructing. Tonsils not swollen or erythematous. Hearing normal.  Neck: Supple, thyroid not palpable. No bruits, nodes or JVD. Respiratory: Respiratory effort normal.  BS equal and clear bilateral without rales, rhonci, wheezing or stridor. Cardio: Heart sounds are normal with regular rate and rhythm and no murmurs, rubs or gallops. Peripheral pulses are normal and equal bilaterally with 1+ pretibial edema  edema. No aortic or femoral bruits. Chest: symmetric with normal excursions and percussion. Breasts: Symmetric, without lumps, nipple discharge, retractions, or fibrocystic changes.  Abdomen: Flat, soft with bowel sounds active. Nontender, no guarding,  rebound, hernias, masses, or organomegaly.  Lymphatics: Non tender without lymphadenopathy.  Musculoskeletal: Full ROM all peripheral extremities, joint stability, 5/5 strength, and normal gait. Skin: Warm and dry without rashes, lesions, cyanosis, clubbing or  ecchymosis.  Neuro: Cranial nerves intact, reflexes equal bilaterally. Normal muscle tone, no cerebellar symptoms. Sensation intact.  Pysch: Alert and oriented X 3, normal affect, Insight and Judgment appropriate.    Assessment and Plan  1. Annual Preventative Screening Examination   2. Essential hypertension  - Add HCTZ 25 mg for dependent edema  - EKG 12-Lead - Urinalysis, Routine w reflex microscopic - Microalbumin / creatinine urine ratio - CBC with Differential/Platelet - COMPLETE METABOLIC PANEL WITH GFR - Magnesium - TSH  3. Hyperlipidemia, mixed  - Lipid panel - TSH  4. Abnormal glucose  - Hemoglobin A1c - Insulin, random  5. Vitamin D deficiency  - VITAMIN D 25 Hydroxy  6. Prediabetes  - Hemoglobin A1c - Insulin, random  7. Gastroesophageal reflux disease without esophagitis  - CBC with Differential/Platelet  8. Age-related osteoporosis without current pathological fracture  - PTH, intact and calcium  9. Aortic atherosclerosis ([place]) - CT chest 05/13/2021  - EKG 12-Lead - Lipid panel  10. Screening for ischemic heart disease  - EKG 12-Lead  11. FHx: heart disease  - EKG 12-Lead  12. Medication management  - Urinalysis, Routine w reflex microscopic - Microalbumin / creatinine urine ratio  13. Screening for colorectal cancer  - POC Hemoccult Bld/St          Patient was counseled in prudent diet to achieve/maintain BMI less than 25 for weight control, BP monitoring, regular exercise and medications. Discussed med's effects and SE's. Screening labs and tests as requested with regular follow-up as recommended. Over 40 minutes of exam, counseling, chart review and high complex critical  decision making was performed.   Kirtland Bouchard, MD

## 2021-05-29 NOTE — Patient Instructions (Signed)

## 2021-05-30 ENCOUNTER — Ambulatory Visit (INDEPENDENT_AMBULATORY_CARE_PROVIDER_SITE_OTHER): Payer: Medicare HMO | Admitting: Internal Medicine

## 2021-05-30 ENCOUNTER — Encounter: Payer: Self-pay | Admitting: Internal Medicine

## 2021-05-30 ENCOUNTER — Other Ambulatory Visit: Payer: Self-pay

## 2021-05-30 VITALS — BP 136/76 | HR 78 | Temp 97.3°F | Resp 17 | Ht 65.0 in | Wt 155.4 lb

## 2021-05-30 DIAGNOSIS — E782 Mixed hyperlipidemia: Secondary | ICD-10-CM

## 2021-05-30 DIAGNOSIS — E559 Vitamin D deficiency, unspecified: Secondary | ICD-10-CM

## 2021-05-30 DIAGNOSIS — Z0001 Encounter for general adult medical examination with abnormal findings: Secondary | ICD-10-CM

## 2021-05-30 DIAGNOSIS — M810 Age-related osteoporosis without current pathological fracture: Secondary | ICD-10-CM | POA: Diagnosis not present

## 2021-05-30 DIAGNOSIS — Z79899 Other long term (current) drug therapy: Secondary | ICD-10-CM

## 2021-05-30 DIAGNOSIS — R7303 Prediabetes: Secondary | ICD-10-CM

## 2021-05-30 DIAGNOSIS — I700 Atherosclerosis of aorta: Secondary | ICD-10-CM

## 2021-05-30 DIAGNOSIS — Z8249 Family history of ischemic heart disease and other diseases of the circulatory system: Secondary | ICD-10-CM

## 2021-05-30 DIAGNOSIS — K219 Gastro-esophageal reflux disease without esophagitis: Secondary | ICD-10-CM

## 2021-05-30 DIAGNOSIS — Z1211 Encounter for screening for malignant neoplasm of colon: Secondary | ICD-10-CM

## 2021-05-30 DIAGNOSIS — R7309 Other abnormal glucose: Secondary | ICD-10-CM | POA: Diagnosis not present

## 2021-05-30 DIAGNOSIS — Z0000 Encounter for general adult medical examination without abnormal findings: Secondary | ICD-10-CM | POA: Diagnosis not present

## 2021-05-30 DIAGNOSIS — I10 Essential (primary) hypertension: Secondary | ICD-10-CM | POA: Diagnosis not present

## 2021-05-30 DIAGNOSIS — Z136 Encounter for screening for cardiovascular disorders: Secondary | ICD-10-CM

## 2021-05-30 MED ORDER — HYDROCHLOROTHIAZIDE 25 MG PO TABS: ORAL_TABLET | ORAL | 3 refills | Status: DC

## 2021-05-31 LAB — CBC WITH DIFFERENTIAL/PLATELET
Absolute Monocytes: 207 cells/uL (ref 200–950)
Basophils Absolute: 21 cells/uL (ref 0–200)
Basophils Relative: 0.7 %
Eosinophils Absolute: 69 cells/uL (ref 15–500)
Eosinophils Relative: 2.3 %
HCT: 39 % (ref 35.0–45.0)
Hemoglobin: 12.5 g/dL (ref 11.7–15.5)
Lymphs Abs: 684 cells/uL — ABNORMAL LOW (ref 850–3900)
MCH: 26.3 pg — ABNORMAL LOW (ref 27.0–33.0)
MCHC: 32.1 g/dL (ref 32.0–36.0)
MCV: 81.9 fL (ref 80.0–100.0)
MPV: 11.6 fL (ref 7.5–12.5)
Monocytes Relative: 6.9 %
Neutro Abs: 2019 cells/uL (ref 1500–7800)
Neutrophils Relative %: 67.3 %
Platelets: 238 10*3/uL (ref 140–400)
RBC: 4.76 10*6/uL (ref 3.80–5.10)
RDW: 13.7 % (ref 11.0–15.0)
Total Lymphocyte: 22.8 %
WBC: 3 10*3/uL — ABNORMAL LOW (ref 3.8–10.8)

## 2021-05-31 LAB — MICROALBUMIN / CREATININE URINE RATIO
Creatinine, Urine: 88 mg/dL (ref 20–275)
Microalb Creat Ratio: 10 mcg/mg creat (ref <30)
Microalb, Ur: 0.9 mg/dL

## 2021-05-31 LAB — COMPLETE METABOLIC PANEL WITH GFR
AG Ratio: 1.6 (calc) (ref 1.0–2.5)
ALT: 7 U/L (ref 6–29)
AST: 16 U/L (ref 10–35)
Albumin: 4.2 g/dL (ref 3.6–5.1)
Alkaline phosphatase (APISO): 82 U/L (ref 37–153)
BUN: 10 mg/dL (ref 7–25)
CO2: 31 mmol/L (ref 20–32)
Calcium: 9.7 mg/dL (ref 8.6–10.4)
Chloride: 105 mmol/L (ref 98–110)
Creat: 0.7 mg/dL (ref 0.60–0.93)
GFR, Est African American: 98 mL/min/{1.73_m2} (ref >=60)
GFR, Est Non African American: 85 mL/min/{1.73_m2} (ref >=60)
Globulin: 2.7 g/dL (calc) (ref 1.9–3.7)
Glucose, Bld: 95 mg/dL (ref 65–99)
Potassium: 4.1 mmol/L (ref 3.5–5.3)
Sodium: 142 mmol/L (ref 135–146)
Total Bilirubin: 0.6 mg/dL (ref 0.2–1.2)
Total Protein: 6.9 g/dL (ref 6.1–8.1)

## 2021-05-31 LAB — URINALYSIS, ROUTINE W REFLEX MICROSCOPIC
Bilirubin Urine: NEGATIVE
Glucose, UA: NEGATIVE
Hgb urine dipstick: NEGATIVE
Ketones, ur: NEGATIVE
Leukocytes,Ua: NEGATIVE
Nitrite: NEGATIVE
Protein, ur: NEGATIVE
Specific Gravity, Urine: 1.014 (ref 1.001–1.035)
pH: <=5 (ref 5.0–8.0)

## 2021-05-31 LAB — LIPID PANEL
Cholesterol: 168 mg/dL (ref <200)
HDL: 73 mg/dL (ref >=50)
LDL Cholesterol (Calc): 82 mg/dL (calc)
Non-HDL Cholesterol (Calc): 95 mg/dL (calc) (ref <130)
Total CHOL/HDL Ratio: 2.3 (calc) (ref <5.0)
Triglycerides: 51 mg/dL (ref <150)

## 2021-05-31 LAB — PTH, INTACT AND CALCIUM
Calcium: 9.7 mg/dL (ref 8.6–10.4)
PTH: 60 pg/mL (ref 16–77)

## 2021-05-31 LAB — TSH: TSH: 1.13 mIU/L (ref 0.40–4.50)

## 2021-05-31 LAB — MAGNESIUM: Magnesium: 2 mg/dL (ref 1.5–2.5)

## 2021-05-31 LAB — HEMOGLOBIN A1C
Hgb A1c MFr Bld: 5.7 %{Hb} — ABNORMAL HIGH
Mean Plasma Glucose: 117 mg/dL
eAG (mmol/L): 6.5 mmol/L

## 2021-05-31 LAB — VITAMIN D 25 HYDROXY (VIT D DEFICIENCY, FRACTURES): Vit D, 25-Hydroxy: 70 ng/mL (ref 30–100)

## 2021-05-31 LAB — INSULIN, RANDOM: Insulin: 11.6 u[IU]/mL

## 2021-05-31 NOTE — Progress Notes (Signed)
============================================================ ============================================================    Total & LDL Chol - both very low risk for a heart attack or stroke ============================================================ ============================================================  -  A1c = 5.7%  - almost back to Normal - Great  ============================================================ ============================================================  -  Vitamin D = 70 - Excellent   ============================================================ ============================================================  -  All Else - CBC - Kidneys - Electrolytes - Liver - Magnesium & Thyroid    - all  Normal / OK ============================================================

## 2021-06-03 NOTE — Progress Notes (Signed)
Pt was called to discuss her lab results. Pt stated she understood. 06/03/21 DG/[facility]

## 2021-06-14 ENCOUNTER — Other Ambulatory Visit: Payer: Self-pay | Admitting: Internal Medicine

## 2021-06-14 DIAGNOSIS — R519 Headache, unspecified: Secondary | ICD-10-CM

## 2021-06-14 DIAGNOSIS — I10 Essential (primary) hypertension: Secondary | ICD-10-CM

## 2021-06-21 ENCOUNTER — Other Ambulatory Visit: Payer: Self-pay | Admitting: Adult Health

## 2021-06-27 ENCOUNTER — Ambulatory Visit: Payer: Medicare HMO | Admitting: Adult Health

## 2021-08-18 DIAGNOSIS — J00 Acute nasopharyngitis [common cold]: Secondary | ICD-10-CM | POA: Diagnosis not present

## 2021-08-18 DIAGNOSIS — Z03818 Encounter for observation for suspected exposure to other biological agents ruled out: Secondary | ICD-10-CM | POA: Diagnosis not present

## 2021-08-21 ENCOUNTER — Encounter (HOSPITAL_COMMUNITY): Payer: Self-pay

## 2021-08-21 ENCOUNTER — Other Ambulatory Visit: Payer: Self-pay

## 2021-08-21 ENCOUNTER — Emergency Department (HOSPITAL_COMMUNITY)
Admission: EM | Admit: 2021-08-21 | Discharge: 2021-08-22 | Disposition: A | Discharge status: Home / Self Care | Payer: Medicare HMO | Attending: Emergency Medicine | Admitting: Emergency Medicine

## 2021-08-21 DIAGNOSIS — I10 Essential (primary) hypertension: Secondary | ICD-10-CM | POA: Insufficient documentation

## 2021-08-21 DIAGNOSIS — M5432 Sciatica, left side: Secondary | ICD-10-CM

## 2021-08-21 DIAGNOSIS — M25552 Pain in left hip: Secondary | ICD-10-CM | POA: Diagnosis present

## 2021-08-21 DIAGNOSIS — Z79899 Other long term (current) drug therapy: Secondary | ICD-10-CM | POA: Insufficient documentation

## 2021-08-21 DIAGNOSIS — Z7982 Long term (current) use of aspirin: Secondary | ICD-10-CM | POA: Diagnosis not present

## 2021-08-21 DIAGNOSIS — M5442 Lumbago with sciatica, left side: Secondary | ICD-10-CM | POA: Insufficient documentation

## 2021-08-21 MED ORDER — OXYCODONE-ACETAMINOPHEN 5-325 MG PO TABS
1.0000 | ORAL_TABLET | Freq: Once | ORAL | Status: AC
Start: 2021-08-22 — End: 2021-08-21
  Administered 2021-08-21: 1 via ORAL
  Filled 2021-08-21: qty 1

## 2021-08-21 MED ORDER — IBUPROFEN 400 MG PO TABS
400.0000 mg | ORAL_TABLET | Freq: Once | ORAL | Status: AC
  Administered 2021-08-21: 400 mg via ORAL
  Filled 2021-08-21: qty 1

## 2021-08-21 NOTE — ED Triage Notes (Signed)
Pt arrived via POV from home c/o left sciatic pain that began last night while Pt reports she was laying in bed. Pt also endorses cramping. Pt denies injury. Pt recently saw PCP on Thursday for the cramping, and was started on Flexerill without relief, and now painhas began to feel like sharp shooting pain.

## 2021-08-21 NOTE — ED Provider Notes (Signed)
[facility] EMERGENCY DEPARTMENT Provider Note   CSN: [id number] Arrival date & time: 08/21/21  2015     History Chief Complaint  Patient presents with   Hip Pain    Emily Jenkins is a 75 y.o. female.  Patient presents to the emergency department for evaluation of left hip pain.  Patient reports that she has been having pain in the left leg for a couple of days.  Now the pain is centered around the posterior hip and radiates down the leg.  Patient denies any injury.  She has not had any numbness, weakness of the lower extremity.  No change in bowel or bladder function.      Past Medical History:  Diagnosis Date   Depression 09/22/2017   09/21/2017 PHQ-9: 9   DJD (degenerative joint disease)    GERD (gastroesophageal reflux disease)    Hyperlipidemia    Hypertension    Pre-diabetes    RLS (restless legs syndrome)     Patient Active Problem List   Diagnosis Date Noted   Aortic atherosclerosis ([place]) - CT chest 05/13/2021 05/14/2021   Multinodular goiter 04/29/2021   PSVT (paroxysmal supraventricular tachycardia) ([place]) 03/10/2021   Abnormal ultrasound of parathyroid gland 01/24/2021   Pulmonary nodules 01/12/2021   Bilateral primary osteoarthritis of knee 08/20/2019   Overweight (BMI 25.0-29.9) 10/29/2015   Osteoporosis 06/07/2015   GERD  03/04/2015   Medication management 01/05/2014   Essential hypertension 12/02/2013   Mixed hyperlipidemia 12/02/2013   Abnormal glucose 12/02/2013   Vitamin D deficiency 12/02/2013   Varicose veins of both lower extremities 09/16/2012    Past Surgical History:  Procedure Laterality Date   ANKLE SURGERY     CHOLECYSTECTOMY     LEFT HEART CATHETERIZATION WITH CORONARY ANGIOGRAM N/A 07/29/2014   Procedure: LEFT HEART CATHETERIZATION WITH CORONARY ANGIOGRAM;  Surgeon: Burnell Blanks, MD;  Location: [facility] CATH LAB;  Service: Cardiovascular;  Laterality: N/A;   TUBAL LIGATION       OB History   No obstetric history on  file.     Family History  Problem Relation Age of Onset   Heart disease Mother 45       stent   Hypertension Mother    Cancer Father    Diabetes Father    Hyperlipidemia Sister    Hypertension Sister     Social History   Tobacco Use   Smoking status: Never   Smokeless tobacco: Never   Tobacco comments:    SPOUSE SMOKES  Vaping Use   Vaping Use: Never used  Substance Use Topics   Alcohol use: No    Alcohol/week: 0.0 standard drinks   Drug use: No    Home Medications Prior to Admission medications   Medication Sig Start Date End Date Taking? Authorizing Provider  cyclobenzaprine (FLEXERIL) 5 MG tablet Take 5 mg by mouth 3 (three) times daily as needed for muscle spasms.   Yes [provider]  meloxicam (MOBIC) 7.5 MG tablet Take 1 tablet (7.5 mg total) by mouth daily. 08/22/21  Yes Deano Tomaszewski, Gwenyth Allegra, MD  oxyCODONE-acetaminophen (PERCOCET) 5-325 MG tablet Take 0.5-1 tablets by mouth every 4 (four) hours as needed. 08/22/21  Yes Collie Kittel, Gwenyth Allegra, MD  aspirin 81 MG tablet Take 81 mg by mouth every evening.     [provider]  cholecalciferol (VITAMIN D) 1000 units tablet Take 5,000 Units by mouth daily.     [provider]  famotidine (PEPCID) 20 MG tablet Take 1 tab twice daily  prior to breakfast and dinner for stomach acid as directed. 02/23/21   Liane Comber, NP  hydrochlorothiazide (HYDRODIURIL) 25 MG tablet Take 1 tablet daily for BP and Fluid Retention /Ankle Swelling 05/30/21   Unk Pinto, MD  psyllium (METAMUCIL) 58.6 % packet Take 1 packet by mouth daily.    [provider]  rosuvastatin (CRESTOR) 5 MG tablet TAKE 1 TABLET BY MOUTH ONCE DAILY FOR CHOLESTEROL 06/21/21   Magda Bernheim, NP  telmisartan (MICARDIS) 80 MG tablet Take 1 tablet by mouth once daily for blood pressure 06/14/21   Liane Comber, NP  vitamin C (ASCORBIC ACID) 500 MG tablet Take 500 mg by mouth daily.    [provider]    Allergies     Escitalopram, Fosamax [alendronate sodium], Levofloxacin, Sulfa antibiotics, and Tramadol  Review of Systems   Review of Systems  Musculoskeletal:  Positive for arthralgias.  Neurological: Negative.    Physical Exam Updated Vital Signs BP 122/62   Pulse 68   Temp 98.1 F (36.7 C)   Resp 18   Ht '5\' 5"'$  (1.651 m)   Wt 70.3 kg   SpO2 97%   BMI 25.79 kg/m   Physical Exam Vitals and nursing note reviewed.  Constitutional:      General: She is not in acute distress.    Appearance: Normal appearance. She is well-developed.  HENT:     Head: Normocephalic and atraumatic.     Right Ear: Hearing normal.     Left Ear: Hearing normal.     Nose: Nose normal.  Eyes:     Conjunctiva/sclera: Conjunctivae normal.     Pupils: Pupils are equal, round, and reactive to light.  Cardiovascular:     Rate and Rhythm: Regular rhythm.     Heart sounds: S1 normal and S2 normal. No murmur heard.   No friction rub. No gallop.  Pulmonary:     Effort: Pulmonary effort is normal. No respiratory distress.     Breath sounds: Normal breath sounds.  Chest:     Chest wall: No tenderness.  Abdominal:     General: Bowel sounds are normal.     Palpations: Abdomen is soft.     Tenderness: There is no abdominal tenderness. There is no guarding or rebound. Negative signs include Murphy's sign and McBurney's sign.     Hernia: No hernia is present.  Musculoskeletal:        General: Normal range of motion.     Cervical back: Normal range of motion and neck supple.  Skin:    General: Skin is warm and dry.     Findings: No rash.  Neurological:     Mental Status: She is alert and oriented to person, place, and time.     GCS: GCS eye subscore is 4. GCS verbal subscore is 5. GCS motor subscore is 6.     Cranial Nerves: No cranial nerve deficit.     Sensory: No sensory deficit.     Coordination: Coordination normal.     Deep Tendon Reflexes:     Reflex Scores:      Patellar reflexes are 1+ on the right side  and 1+ on the left side.    Comments: No saddle anesthesia.  No foot drop.  Normal strength and sensation in both lower extremities  Psychiatric:        Speech: Speech normal.        Behavior: Behavior normal.        Thought Content: Thought content  normal.    ED Results / Procedures / Treatments   Labs (all labs ordered are listed, but only abnormal results are displayed) Labs Reviewed - No data to display  EKG None  Radiology No results found.  Procedures Procedures   Medications Ordered in ED Medications  ibuprofen (ADVIL) tablet 400 mg (400 mg Oral Given 08/21/21 2358)  oxyCODONE-acetaminophen (PERCOCET/ROXICET) 5-325 MG per tablet 1 tablet (1 tablet Oral Given 08/21/21 2358)    ED Course  I have reviewed the triage vital signs and the nursing notes.  Pertinent labs & imaging results that were available during my care of the patient were reviewed by me and considered in my medical decision making (see chart for details).    MDM Rules/Calculators/A&P                           Patient presents to the ER with musculoskeletal back pain. Examination reveals back tenderness without any associated neurologic findings. Patient's strength, sensation and reflexes were normal. There is no evidence of saddle anesthesia. Patient does not have a foot drop. Patient has not experienced any change in bowel or bladder function. As such, patient did not require any imaging or further studies. Patient was treated with analgesia. Final Clinical Impression(s) / ED Diagnoses Final diagnoses:  Sciatica of left side    Rx / DC Orders ED Discharge Orders          Ordered    meloxicam (MOBIC) 7.5 MG tablet  Daily        08/22/21 0115    oxyCODONE-acetaminophen (PERCOCET) 5-325 MG tablet  Every 4 hours PRN        08/22/21 0115             Orpah Greek, MD 08/22/21 [id number]

## 2021-08-22 MED ORDER — MELOXICAM 7.5 MG PO TABS: 7.5000 mg | ORAL_TABLET | Freq: Every day | ORAL | 0 refills | Status: DC

## 2021-08-22 MED ORDER — OXYCODONE-ACETAMINOPHEN 5-325 MG PO TABS: 0.5000 | ORAL_TABLET | ORAL | 0 refills | Status: DC | PRN

## 2021-09-01 ENCOUNTER — Ambulatory Visit: Payer: Medicare HMO | Admitting: Adult Health

## 2021-09-08 ENCOUNTER — Ambulatory Visit (INDEPENDENT_AMBULATORY_CARE_PROVIDER_SITE_OTHER): Payer: Medicare HMO | Admitting: Adult Health

## 2021-09-08 ENCOUNTER — Other Ambulatory Visit: Payer: Self-pay

## 2021-09-08 ENCOUNTER — Encounter: Payer: Self-pay | Admitting: Adult Health

## 2021-09-08 VITALS — BP 136/70 | HR 67 | Temp 97.7°F | Wt 157.4 lb

## 2021-09-08 DIAGNOSIS — E559 Vitamin D deficiency, unspecified: Secondary | ICD-10-CM

## 2021-09-08 DIAGNOSIS — Z23 Encounter for immunization: Secondary | ICD-10-CM | POA: Diagnosis not present

## 2021-09-08 DIAGNOSIS — E042 Nontoxic multinodular goiter: Secondary | ICD-10-CM

## 2021-09-08 DIAGNOSIS — E782 Mixed hyperlipidemia: Secondary | ICD-10-CM

## 2021-09-08 DIAGNOSIS — E663 Overweight: Secondary | ICD-10-CM

## 2021-09-08 DIAGNOSIS — Z79899 Other long term (current) drug therapy: Secondary | ICD-10-CM

## 2021-09-08 DIAGNOSIS — K219 Gastro-esophageal reflux disease without esophagitis: Secondary | ICD-10-CM

## 2021-09-08 DIAGNOSIS — I471 Supraventricular tachycardia: Secondary | ICD-10-CM | POA: Diagnosis not present

## 2021-09-08 DIAGNOSIS — I10 Essential (primary) hypertension: Secondary | ICD-10-CM | POA: Diagnosis not present

## 2021-09-08 DIAGNOSIS — R7309 Other abnormal glucose: Secondary | ICD-10-CM

## 2021-09-08 DIAGNOSIS — I700 Atherosclerosis of aorta: Secondary | ICD-10-CM | POA: Diagnosis not present

## 2021-09-08 DIAGNOSIS — M5432 Sciatica, left side: Secondary | ICD-10-CM

## 2021-09-08 MED ORDER — CYCLOBENZAPRINE HCL 5 MG PO TABS: 5.0000 mg | ORAL_TABLET | Freq: Three times a day (TID) | ORAL | 0 refills | Status: DC | PRN

## 2021-09-08 MED ORDER — MELOXICAM 7.5 MG PO TABS: ORAL_TABLET | ORAL | 0 refills | Status: DC

## 2021-09-08 MED ORDER — FAMOTIDINE 20 MG PO TABS: ORAL_TABLET | ORAL | 1 refills | Status: DC

## 2021-09-08 NOTE — Patient Instructions (Signed)
Sciatica Rehab °Ask your health care provider which exercises are safe for you. Do exercises exactly as told by your health care provider and adjust them as directed. It is normal to feel mild stretching, pulling, tightness, or discomfort as you do these exercises. Stop right away if you feel sudden pain or your pain gets worse. Do not begin these exercises until told by your health care provider. °Stretching and range-of-motion exercises °These exercises warm up your muscles and joints and improve the movement and flexibility of your hips and back. These exercises also help to relieve pain, numbness, and tingling. °Sciatic nerve glide °Sit in a chair with your head facing down toward your chest. Place your hands behind your back. Let your shoulders slump forward. °Slowly straighten one of your legs while you tilt your head back as if you are looking toward the ceiling. Only straighten your leg as far as you can without making your symptoms worse. °Hold this position for __________ seconds. °Slowly return to the starting position. °Repeat with your other leg. °Repeat __________ times. Complete this exercise __________ times a day. °Knee to chest with hip adduction and internal rotation ° °Lie on your back on a firm surface with both legs straight. °Bend one of your knees and move it up toward your chest until you feel a gentle stretch in your lower back and buttock. Then, move your knee toward the shoulder that is on the opposite side from your leg. This is hip adduction and internal rotation. °Hold your leg in this position by holding on to the front of your knee. °Hold this position for __________ seconds. °Slowly return to the starting position. °Repeat with your other leg. °Repeat __________ times. Complete this exercise __________ times a day. °Prone extension on elbows ° °Lie on your abdomen on a firm surface. A bed may be too soft for this exercise. °Prop yourself up on your elbows. °Use your arms to help  lift your chest up until you feel a gentle stretch in your abdomen and your lower back. °This will place some of your body weight on your elbows. If this is uncomfortable, try stacking pillows under your chest. °Your hips should stay down, against the surface that you are lying on. Keep your hip and back muscles relaxed. °Hold this position for __________ seconds. °Slowly relax your upper body and return to the starting position. °Repeat __________ times. Complete this exercise __________ times a day. °Strengthening exercises °These exercises build strength and endurance in your back. Endurance is the ability to use your muscles for a long time, even after they get tired. °Pelvic tilt °This exercise strengthens the muscles that lie deep in the abdomen. °Lie on your back on a firm surface. Bend your knees and keep your feet flat on the floor. °Tense your abdominal muscles. Tip your pelvis up toward the ceiling and flatten your lower back into the floor. °To help with this exercise, you may place a small towel under your lower back and try to push your back into the towel. °Hold this position for __________ seconds. °Let your muscles relax completely before you repeat this exercise. °Repeat __________ times. Complete this exercise __________ times a day. °Alternating arm and leg raises ° °Get on your hands and knees on a firm surface. If you are on a hard floor, you may want to use padding, such as an exercise mat, to cushion your knees. °Line up your arms and legs. Your hands should be directly below your shoulders,   and your knees should be directly below your hips. °Lift your left leg behind you. At the same time, raise your right arm and straighten it in front of you. °Do not lift your leg higher than your hip. °Do not lift your arm higher than your shoulder. °Keep your abdominal and back muscles tight. °Keep your hips facing the ground. °Do not arch your back. °Keep your balance carefully, and do not hold your  breath. °Hold this position for __________ seconds. °Slowly return to the starting position. °Repeat with your right leg and your left arm. °Repeat __________ times. Complete this exercise __________ times a day. °Posture and body mechanics °Good posture and healthy body mechanics can help to relieve stress in your body's tissues and joints. Body mechanics refers to the movements and positions of your body while you do your daily activities. Posture is part of body mechanics. Good posture means: °Your spine is in its natural S-curve position (neutral). °Your shoulders are pulled back slightly. °Your head is not tipped forward. °Follow these guidelines to improve your posture and body mechanics in your everyday activities. °Standing ° °When standing, keep your spine neutral and your feet about hip width apart. Keep a slight bend in your knees. Your ears, shoulders, and hips should line up. °When you do a task in which you stand in one place for a long time, place one foot up on a stable object that is 2-4 inches (5-10 cm) high, such as a footstool. This helps keep your spine neutral. °Sitting ° °When sitting, keep your spine neutral and keep your feet flat on the floor. Use a footrest, if necessary, and keep your thighs parallel to the floor. Avoid rounding your shoulders, and avoid tilting your head forward. °When working at a desk or a computer, keep your desk at a height where your hands are slightly lower than your elbows. Slide your chair under your desk so you are close enough to maintain good posture. °When working at a computer, place your monitor at a height where you are looking straight ahead and you do not have to tilt your head forward or downward to look at the screen. °Resting °When lying down and resting, avoid positions that are most painful for you. °If you have pain with activities such as sitting, bending, stooping, or squatting, lie in a position in which your body does not bend very much. For  example, avoid curling up on your side with your arms and knees near your chest (fetal position). °If you have pain with activities such as standing for a long time or reaching with your arms, lie with your spine in a neutral position and bend your knees slightly. Try the following positions: °Lying on your side with a pillow between your knees. °Lying on your back with a pillow under your knees. °Lifting ° °When lifting objects, keep your feet at least shoulder width apart and tighten your abdominal muscles. °Bend your knees and hips and keep your spine neutral. It is important to lift using the strength of your legs, not your back. Do not lock your knees straight out. °Always ask for help to lift heavy or awkward objects. °This information is not intended to replace advice given to you by your health care provider. Make sure you discuss any questions you have with your health care provider. °Document Revised: 03/21/2019 Document Reviewed: 12/19/2018 °[employer] Patient Education © 2022 [employer] ° °

## 2021-09-08 NOTE — Progress Notes (Addendum)
6 MONTH FOLLOW UP  Assessment:   Atherosclerosis of aorta ([place]) -  per CT 05/13/2021 Control blood pressure, cholesterol, glucose, increase exercise.   Essential hypertension Continue medication;  Monitor blood pressure at home; call if consistently over 130/80 Continue DASH diet.   Reminder to go to the ER if any CP, SOB, nausea, dizziness, severe HA, changes vision/speech, left arm numbness and tingling and jaw pain.  Gastroesophageal reflux disease, esophagitis presence not specified Doing well off of PPI, on H2i Discussed diet, avoiding triggers and other lifestyle changes  Vitamin D deficiency At goal at recent check; continue to recommend supplementation for goal of 60-100 Defer vitamin D level  Abnormal glucose (prediabetes) Discussed diet/exercise, weight management  Defer A1C to next visit, check CMP  Mixed hyperlipidemia Newly on rosuvastatin with LDL at goal  Continue low cholesterol diet and exercise.  Check lipid panel.   Medication management CBC, CMP/GFR, magnesium   Overweight (BMI 25.0-29.9) Long discussion about weight loss, diet, and exercise Recommended diet heavy in fruits and veggies and low in animal meats, cheeses, and dairy products, appropriate calorie intake Discussed appropriate weight for height and initial goal  Follow up at next visit  Knee arthritis Follow up Dr. Berenice Primas, getting injections  Pulmonary nodules Follow up CT planned 05/2022-11/2022  Parathryoid enlarged vs adenoma With normal calcium but osteoporosis No hx of renal calculi Endocrinology is following  Left sided sciatica Refilled mexlociam and flexeril for an additional 2-4 weeks Stretches/excises given  Follow up if persistent, will refer to PT Has seen ortho and had imaging  Need for influenza vaccine High dose quadrivalent administered without complication today    Orders Placed This Encounter  Procedures   Flu vaccine HIGH DOSE PF   CBC with  Differential/Platelet   COMPLETE METABOLIC PANEL WITH GFR   Magnesium   Lipid panel   TSH      Over 30 minutes of exam, counseling, chart review and critical decision making was performed Future Appointments  Date Time Provider [place]  10/31/2021 10:50 AM Shamleffer, Melanie Crazier, MD [facility] None  02/23/2022 11:30 AM Liane Comber, NP [facility] None  05/30/2022 11:00 AM Unk Pinto, MD [facility] None    Subjective:  Emily Jenkins is a 75 y.o. female who presents for Medicare Annual Wellness Visit and 3 month follow up.   She recently presented to ED on 08/21/2021 and diagnosed with L sided sciatica, no fall/injury, given percocet, meloxicam, flexeril, did improve significantly, but reports having persistent mild intermittent sx. Has been taking tylenol arthritis with some benefit. Some intermittent limited lumbar pain. Has seen Dr. Lorin Mercy in the past for intermittent limited lumbar pain episodes, had xrays there in 2020.   Recently had CT chest in 01/2021 showing several ill defined nodules, had CT follow up in 05/2021 was stable, was recommended 12-18 month CT follow up.   Also incidentally noted mass in thyroid area; US showed 1.7 cm right inferior enlarged parathyroid versus parathyroid adenoma. Denies hx of renal stones, does have osteoporosis -DEX12/2019 - R hip T improved from -2.8 to -2.5. Her calcium has been normal. Dr. Kelton Pillar now following, has next planned in Nov.   Lab Results  Component Value Date   PTH 60 05/30/2021   CALCIUM 9.7 05/30/2021   CALCIUM 9.7 05/30/2021   CAION 1.05 (L) 04/27/2008   She is followed by Dr. Lorin Mercy for bil knee arthritis, getting injections, needs surgery but has been deferring.   she has a diagnosis of GERD which  is currently managed by famotidine 20 mg BID, doing well off of protonix.   BMI is Body mass index is 26.19 kg/m., she has been working on diet and exercise, walking up and down her driveway in the  morning when cool, 15 min or so.  Wt Readings from Last 3 Encounters:  09/08/21 157 lb 6.4 oz (71.4 kg)  08/21/21 155 lb (70.3 kg)  05/30/21 155 lb 6.4 oz (70.5 kg)    Her blood pressure has been controlled at home (130/80 yesterday), today their BP is BP: 136/70 She does workout. She denies chest pain, shortness of breath, dizziness.   She has aortic atherosclerosis per CT 05/13/2021.   She is on cholesterol medication (rosuvastatin 5 mg daily and tolerating well) and denies myalgias. Her cholesterol is at goal. The cholesterol last visit was:   Lab Results  Component Value Date   CHOL 168 05/30/2021   HDL 73 05/30/2021   LDLCALC 82 05/30/2021   TRIG 51 05/30/2021   CHOLHDL 2.3 05/30/2021    She has not been working on diet and exercise for glucose management, and denies increased appetite, nausea, paresthesia of the feet, polydipsia, polyuria and visual disturbances. Last A1C in the office was:  Lab Results  Component Value Date   HGBA1C 5.7 (H) 05/30/2021   Last GFR: Lab Results  Component Value Date   GFRAA 98 05/30/2021   Patient is on Vitamin D supplement.   Lab Results  Component Value Date   VD25OH 55 05/30/2021        Medication Review: Current Outpatient Medications on File Prior to Visit  Medication Sig Dispense Refill   aspirin 81 MG tablet Take 81 mg by mouth every evening.      cholecalciferol (VITAMIN D) 1000 units tablet Take 5,000 Units by mouth daily.      famotidine (PEPCID) 20 MG tablet Take 1 tab twice daily prior to breakfast and dinner for stomach acid as directed. 180 tablet 1   hydrochlorothiazide (HYDRODIURIL) 25 MG tablet Take 1 tablet daily for BP and Fluid Retention /Ankle Swelling 90 tablet 3   psyllium (METAMUCIL) 58.6 % packet Take 1 packet by mouth daily.     rosuvastatin (CRESTOR) 5 MG tablet TAKE 1 TABLET BY MOUTH ONCE DAILY FOR CHOLESTEROL 90 tablet 1   telmisartan (MICARDIS) 80 MG tablet Take 1 tablet by mouth once daily for blood  pressure 90 tablet 1   vitamin C (ASCORBIC ACID) 500 MG tablet Take 500 mg by mouth daily.     cyclobenzaprine (FLEXERIL) 5 MG tablet Take 5 mg by mouth 3 (three) times daily as needed for muscle spasms. (Patient not taking: Reported on 09/08/2021)     meloxicam (MOBIC) 7.5 MG tablet Take 1 tablet (7.5 mg total) by mouth daily. 10 tablet 0   oxyCODONE-acetaminophen (PERCOCET) 5-325 MG tablet Take 0.5-1 tablets by mouth every 4 (four) hours as needed. 10 tablet 0   No current facility-administered medications on file prior to visit.    Allergies  Allergen Reactions   Escitalopram Itching   Fosamax [Alendronate Sodium] Itching   Levofloxacin     Headache, chest pain, muscle aches   Sulfa Antibiotics Itching   Tramadol Itching    Current Problems (verified) Patient Active Problem List   Diagnosis Date Noted   Aortic atherosclerosis ([place]) - CT chest 05/13/2021 05/14/2021   Multinodular goiter 04/29/2021   PSVT (paroxysmal supraventricular tachycardia) ([place]) 03/10/2021   Abnormal ultrasound of parathyroid gland 01/24/2021   Pulmonary nodules  01/12/2021   Bilateral primary osteoarthritis of knee 08/20/2019   Overweight (BMI 25.0-29.9) 10/29/2015   Osteoporosis 06/07/2015   GERD  03/04/2015   Medication management 01/05/2014   Essential hypertension 12/02/2013   Mixed hyperlipidemia 12/02/2013   Abnormal glucose 12/02/2013   Vitamin D deficiency 12/02/2013   Varicose veins of both lower extremities 09/16/2012    Screening Tests Immunization History  Administered Date(s) Administered   DTaP 12/11/2000   Influenza Split 10/24/2013   Influenza, High Dose Seasonal PF 09/17/2014, 09/09/2015, 08/21/2016, 09/21/2017, 10/14/2018, 10/13/2019, 09/23/2020   Moderna Sars-Covid-2 Vaccination 01/18/2020, 02/18/2020, 11/09/2020   Pneumococcal Conjugate-13 08/21/2016   Pneumococcal Polysaccharide-23 12/11/2002, 01/08/2018   Tdap 07/11/2012   Zoster, Live 08/06/2013   Prior vaccinations: TD  or Tdap: 2013  Influenza: 2021 Pneumococcal: 2004, 2019 Prevnar 13: 2017 Shingles/Zostavax: 2014 Covid 19: 3/3, 2021, moderna    Last colonoscopy: 08/2020, Dr. Collene Mares, polyps were non-adenomatous, DONE   Last EGD: 03/2017 Last mammogram: 12/01/2020 Last pap smear/pelvic exam: pap 2014 DONE, pelvic 2017 DEXA: 2017, 11/2018 - R hip T improved from -2.8 to -2.5, fosamax intolerance (itching) - ordered to schedule follow up   Stress test 07/2014 Cath 07/2014 normal  Names of Other Physician/Practitioners you currently use: 1. [facility]- here for primary care 2. [facility] in [place], [place] doctor, last visit 2021, has scheduled May 2022.  3. Dr. Harrington Challenger, dentist, last visit 2022, still going q65m without issues  Patient Care Team: Unk Pinto, MD as PCP - General (Internal Medicine) Celestia Khat, [place] (Optometry) Shela Leff, MD as Consulting Physician (Orthopedic Surgery) Juanita Craver, MD as Consulting Physician (Gastroenterology) Burnell Blanks, MD as Consulting Physician (Cardiology)  SURGICAL HISTORY She  has a past surgical history that includes Cholecystectomy; Tubal ligation; Ankle surgery; and left heart catheterization with coronary angiogram (N/A, 07/29/2014). FAMILY HISTORY Her family history includes Cancer in her father; Diabetes in her father; Heart disease (age of onset: 22) in her mother; Hyperlipidemia in her sister; Hypertension in her mother and sister. SOCIAL HISTORY She  reports that she has never smoked. She has never used smokeless tobacco. She reports that she does not drink alcohol and does not use drugs.   Review of Systems  Constitutional:  Negative for malaise/fatigue and weight loss.  HENT:  Negative for congestion, hearing loss, sore throat and tinnitus.   Eyes:  Negative for blurred vision and double vision.  Respiratory:  Negative for cough, sputum production, shortness of  breath and wheezing.   Cardiovascular:  Negative for chest pain, palpitations (improved), orthopnea, claudication, leg swelling and PND.  Gastrointestinal:  Negative for abdominal pain, blood in stool, constipation, diarrhea, heartburn, melena, nausea and vomiting.  Genitourinary: Negative.   Musculoskeletal:  Positive for back pain (intermittent mild lumbar/SI) and joint pain (R knee). Negative for falls and myalgias.  Skin:  Negative for rash.  Neurological:  Positive for tingling (intermittent left radicular, foot tingling). Negative for dizziness, sensory change, weakness and headaches.  Endo/Heme/Allergies:  Positive for environmental allergies. Negative for polydipsia.  Psychiatric/Behavioral:  Negative for depression, memory loss, substance abuse and suicidal ideas. The patient is not nervous/anxious and does not have insomnia (intermittent).   All other systems reviewed and are negative.   Objective:     Today's Vitals   09/08/21 1105  BP: 136/70  Pulse: 67  Temp: 97.7 F (36.5 C)  SpO2: 99%  Weight: 157 lb 6.4 oz (71.4 kg)   Body mass index  is 26.19 kg/m.  General appearance: alert, no distress, WD/WN, AA elder female HEENT: normocephalic, sclerae anicteric, TMs pearly, nares patent, no discharge or erythema, pharynx normal Oral cavity: MMM, no lesions Neck: supple, no lymphadenopathy, no thyromegaly, no palpable masses Heart: RRR, normal S1, S2, no murmurs Lungs: CTA bilaterally, no wheezes, rhonchi, or rales Abdomen: +bs, soft, non tender, non distended, no masses, no hepatomegaly, no splenomegaly Musculoskeletal: no swelling, no obvious deformity. Lumbar pain with ROM in all spheres, some left SI tenderness, negative straight leg raise though poor flexibility bil hamstrings Extremities: no edema, no cyanosis, no clubbing Pulses: 2+ symmetric, upper and lower extremities, normal cap refill Neurological: alert, oriented x 3, CN2-12 intact, strength normal upper  extremities and lower extremities, sensation normal throughout, DTRs 2+ throughout, no cerebellar signs, gait mildly Psychiatric: normal affect, behavior normal, pleasant   Medicare Attestation I have personally reviewed: The patient's medical and social history Their use of alcohol, tobacco or illicit drugs Their current medications and supplements The patient's functional ability including ADLs,fall risks, home safety risks, cognitive, and hearing and visual impairment Diet and physical activities Evidence for depression or mood disorders  The patient's weight, height, BMI, and visual acuity have been recorded in the chart.  I have made referrals, counseling, and provided education to the patient based on review of the above and I have provided the patient with a written personalized care plan for preventive services.     Izora Ribas, NP   09/08/2021

## 2021-09-09 ENCOUNTER — Telehealth: Payer: Self-pay

## 2021-09-09 LAB — CBC WITH DIFFERENTIAL/PLATELET
Absolute Monocytes: 273 cells/uL (ref 200–950)
Basophils Absolute: 49 cells/uL (ref 0–200)
Basophils Relative: 1.4 %
Eosinophils Absolute: 172 cells/uL (ref 15–500)
Eosinophils Relative: 4.9 %
HCT: 38.7 % (ref 35.0–45.0)
Hemoglobin: 12.7 g/dL (ref 11.7–15.5)
Lymphs Abs: 935 cells/uL (ref 850–3900)
MCH: 27 pg (ref 27.0–33.0)
MCHC: 32.8 g/dL (ref 32.0–36.0)
MCV: 82.2 fL (ref 80.0–100.0)
MPV: 11.9 fL (ref 7.5–12.5)
Monocytes Relative: 7.8 %
Neutro Abs: 2072 cells/uL (ref 1500–7800)
Neutrophils Relative %: 59.2 %
Platelets: 231 10*3/uL (ref 140–400)
RBC: 4.71 10*6/uL (ref 3.80–5.10)
RDW: 13.8 % (ref 11.0–15.0)
Total Lymphocyte: 26.7 %
WBC: 3.5 10*3/uL — ABNORMAL LOW (ref 3.8–10.8)

## 2021-09-09 LAB — COMPLETE METABOLIC PANEL WITH GFR
AG Ratio: 1.6 (calc) (ref 1.0–2.5)
ALT: 9 U/L (ref 6–29)
AST: 17 U/L (ref 10–35)
Albumin: 4.2 g/dL (ref 3.6–5.1)
Alkaline phosphatase (APISO): 76 U/L (ref 37–153)
BUN: 13 mg/dL (ref 7–25)
CO2: 31 mmol/L (ref 20–32)
Calcium: 9.5 mg/dL (ref 8.6–10.4)
Chloride: 105 mmol/L (ref 98–110)
Creat: 0.88 mg/dL (ref 0.60–1.00)
Globulin: 2.6 g/dL (calc) (ref 1.9–3.7)
Glucose, Bld: 91 mg/dL (ref 65–99)
Potassium: 4.4 mmol/L (ref 3.5–5.3)
Sodium: 143 mmol/L (ref 135–146)
Total Bilirubin: 0.4 mg/dL (ref 0.2–1.2)
Total Protein: 6.8 g/dL (ref 6.1–8.1)
eGFR: 68 mL/min/{1.73_m2} (ref >=60)

## 2021-09-09 LAB — LIPID PANEL
Cholesterol: 162 mg/dL (ref <200)
HDL: 68 mg/dL (ref >=50)
LDL Cholesterol (Calc): 80 mg/dL (calc)
Non-HDL Cholesterol (Calc): 94 mg/dL (calc) (ref <130)
Total CHOL/HDL Ratio: 2.4 (calc) (ref <5.0)
Triglycerides: 65 mg/dL (ref <150)

## 2021-09-09 LAB — MAGNESIUM: Magnesium: 2.1 mg/dL (ref 1.5–2.5)

## 2021-09-09 LAB — TSH: TSH: 2.16 mIU/L (ref 0.40–4.50)

## 2021-09-09 NOTE — Telephone Encounter (Signed)
Prior authorization complete, submitted and approved for Cyclobenzaprine

## 2021-10-31 ENCOUNTER — Ambulatory Visit (INDEPENDENT_AMBULATORY_CARE_PROVIDER_SITE_OTHER): Payer: Medicare HMO | Admitting: Internal Medicine

## 2021-10-31 ENCOUNTER — Other Ambulatory Visit: Payer: Self-pay

## 2021-10-31 VITALS — BP 138/82 | HR 76 | Ht 65.0 in | Wt 154.2 lb

## 2021-10-31 DIAGNOSIS — R42 Dizziness and giddiness: Secondary | ICD-10-CM

## 2021-10-31 DIAGNOSIS — E042 Nontoxic multinodular goiter: Secondary | ICD-10-CM | POA: Diagnosis not present

## 2021-10-31 LAB — T4, FREE: Free T4: 0.85 ng/dL (ref 0.60–1.60)

## 2021-10-31 LAB — TSH: TSH: 2.08 u[IU]/mL (ref 0.35–5.50)

## 2021-10-31 NOTE — Progress Notes (Signed)
Name: Emily Jenkins  MRN/ DOB: [id number], [birth year]    Age/ Sex: 75 y.o., female     PCP: Unk Pinto, MD   Reason for Endocrinology Evaluation: [place]     Initial Endocrinology Clinic Visit: 04/29/21    PATIENT IDENTIFIER: Emily Jenkins is a 75 y.o., female with a past medical history of .HTN, SVT, Osteoporosis and mixed hyperlipidemia. She has followed with [facility] since 04/29/2021 for consultative assistance with management of her MNG.   HISTORICAL SUMMARY:  She was noted to have a right inferior thyroid nodule on CT scan of the chest in 01/2021. This promoted a thyroid ultrasound demonstrating a questionable right parathyroid adenoma vs thyroid nodule. She also has sub-centimeter thyroid nodules    Of note the pt has normal calcium and parathyroid hormone level.    No FH of thyroid disease   SUBJECTIVE:     Today (10/31/2021):  Emily Jenkins is here for a follow up on MNG,.    Denies local neck symptoms  Denies weight changes  Has chronic constipation, takes laxatives Has occasional  feeling a rush to her head when she gets up from the bed and makes her dizzy    HISTORY:  Past Medical History:  Past Medical History:  Diagnosis Date   Depression 09/22/2017   09/21/2017 PHQ-9: 9   DJD (degenerative joint disease)    GERD (gastroesophageal reflux disease)    Hyperlipidemia    Hypertension    Pre-diabetes    RLS (restless legs syndrome)    Past Surgical History:  Past Surgical History:  Procedure Laterality Date   ANKLE SURGERY     CHOLECYSTECTOMY     LEFT HEART CATHETERIZATION WITH CORONARY ANGIOGRAM N/A 07/29/2014   Procedure: LEFT HEART CATHETERIZATION WITH CORONARY ANGIOGRAM;  Surgeon: Burnell Blanks, MD;  Location: [facility] CATH LAB;  Service: Cardiovascular;  Laterality: N/A;   TUBAL LIGATION     Social History:  reports that she has never smoked. She has never used smokeless tobacco. She reports that she does not  drink alcohol and does not use drugs. Family History:  Family History  Problem Relation Age of Onset   Heart disease Mother 40       stent   Hypertension Mother    Cancer Father    Diabetes Father    Hyperlipidemia Sister    Hypertension Sister      HOME MEDICATIONS: Allergies as of 10/31/2021       Reactions   Escitalopram Itching   Fosamax [alendronate Sodium] Itching   Levofloxacin    Headache, chest pain, muscle aches   Sulfa Antibiotics Itching   Tramadol Itching        Medication List        Accurate as of October 31, 2021 11:20 AM. If you have any questions, ask your nurse or doctor.          aspirin 81 MG tablet Take 81 mg by mouth every evening.   cholecalciferol 1000 units tablet Commonly known as: VITAMIN D Take 5,000 Units by mouth daily.   cyclobenzaprine 5 MG tablet Commonly known as: FLEXERIL Take 1 tablet (5 mg total) by mouth 3 (three) times daily as needed for muscle spasms.   famotidine 20 MG tablet Commonly known as: PEPCID Take 1 tab twice daily prior to breakfast and dinner for stomach acid as directed.   hydrochlorothiazide 25 MG tablet Commonly known as: HYDRODIURIL Take 1 tablet daily for BP and Fluid Retention Hyman Hopes  Swelling   meloxicam 7.5 MG tablet Commonly known as: Mobic Take 1 tab daily with food for 10-14 days, then as needed for sciatica.   psyllium 58.6 % packet Commonly known as: METAMUCIL Take 1 packet by mouth daily.   rosuvastatin 5 MG tablet Commonly known as: CRESTOR TAKE 1 TABLET BY MOUTH ONCE DAILY FOR CHOLESTEROL   telmisartan 80 MG tablet Commonly known as: MICARDIS Take 1 tablet by mouth once daily for blood pressure   vitamin C 500 MG tablet Commonly known as: ASCORBIC ACID Take 500 mg by mouth daily.          OBJECTIVE:   PHYSICAL EXAM: VS: BP 138/82 (BP Location: Right Arm, Patient Position: Standing)   Pulse 76   Ht 5\' 5"  (1.651 m)   Wt 154 lb 3.2 oz (69.9 kg)   SpO2 100%    BMI 25.66 kg/m    EXAM: General: Pt appears well and is in NAD  Neck: General: Supple without adenopathy. Thyroid: Thyroid size normal.  No goiter or nodules appreciated.  Lungs: Clear with good BS bilat with no rales, rhonchi, or wheezes  Heart: Auscultation: RRR.  Abdomen: Normoactive bowel sounds, soft, nontender, without masses or organomegaly palpable  Extremities:  BL LE: No pretibial edema normal ROM and strength.  Mental Status: Judgment, insight: Intact Orientation: Oriented to time, place, and person Mood and affect: No depression, anxiety, or agitation     DATA REVIEWED:  Latest Reference Range & Units 10/31/21 11:35  PTH, Intact 16 - 77 pg/mL 45  TSH 0.35 - 5.50 uIU/mL 2.08  T4,Free(Direct) 0.60 - 1.60 ng/dL 0.85     Latest Reference Range & Units 10/31/21 11:35  Calcium 8.6 - 10.4 mg/dL 9.4    ASSESSMENT / PLAN / RECOMMENDATIONS:   MNG:     -Patient is clinically and biochemically euthyroid -She has no local neck symptoms -Thyroid ultrasound shows multinodular goiter but none meets criteria for FNA at this time      2. Questionable Parathyroid adenoma :     -I have personally reviewed the images and it seems  the right posterior nodule in question is embedded within the right lobe, given normal labs, I suspect this is a thyroid nodule    - Repeat labs today continue to show normal Calcium and pTH      3. Dizziness :  - This is intermittent and seems to be when she gets out of bed, e discussed orthostatic hypotension, BP is normal without ortho stasis during exam today  - Discussed D/D of dehydration due to diuretic use, aging, autonomic neuropathy etc  - I have encouraged hydration   F/U in 1 yr   Signed electronically by: Mack Guise, MD  [facility]  [facility] [address] [place], [place] [address] Phone: [phone] FAX: [phone]      CC: Unk Pinto, MD [address] [place] [address] Phone: [phone]  Fax: [phone]   Return to Endocrinology clinic as below: Future Appointments  Date Time Provider [place]  02/23/2022 11:30 AM Liane Comber, NP [facility] None  05/30/2022 11:00 AM Unk Pinto, MD [facility] None

## 2021-11-01 ENCOUNTER — Encounter: Payer: Self-pay | Admitting: Internal Medicine

## 2021-11-01 LAB — PTH, INTACT AND CALCIUM
Calcium: 9.4 mg/dL (ref 8.6–10.4)
PTH: 45 pg/mL (ref 16–77)

## 2021-11-02 ENCOUNTER — Encounter: Payer: Self-pay | Admitting: Internal Medicine

## 2021-11-25 DIAGNOSIS — Z1231 Encounter for screening mammogram for malignant neoplasm of breast: Secondary | ICD-10-CM | POA: Diagnosis not present

## 2021-11-25 LAB — HM MAMMOGRAPHY

## 2021-12-27 ENCOUNTER — Other Ambulatory Visit: Payer: Self-pay | Admitting: Adult Health

## 2021-12-27 ENCOUNTER — Other Ambulatory Visit: Payer: Self-pay | Admitting: Nurse Practitioner

## 2021-12-27 DIAGNOSIS — R519 Headache, unspecified: Secondary | ICD-10-CM

## 2021-12-27 DIAGNOSIS — I10 Essential (primary) hypertension: Secondary | ICD-10-CM

## 2021-12-27 NOTE — Progress Notes (Signed)
ACUTE VISIT   Assessment:   Emily Jenkins was seen today for acute visit.  Diagnoses and all orders for this visit:  Left sided sciatica - negative straight leg Prednisone was prescribed,NSAIDs, RICE, and exercise given If not better follow up in office or will refer to PT/orthopedics. Natural history and expected course discussed. Questions answered. Neurosurgeon distributed. Proper lifting, bending technique discussed. Stretching exercises discussed. Regular aerobic and trunk strengthening exercises discussed. Ice to affected area as needed for local pain relief. NSAIDs per medication orders. Muscle relaxants per medication orders. If not fully resolving follow up with established ortho, may benefit from full PT -     tiZANidine (ZANAFLEX) 4 MG tablet; Take 0.5-1 tablets (2-4 mg total) by mouth every 8 (eight) hours as needed for muscle spasms. Do not drive while taking this medication. -     predniSONE (DELTASONE) 20 MG tablet; 1 tab 3 x day for 3 days, then 1 tab 2 x day for 3 days, then 1 tab 1 x day for 5 days. Please take in the morning with food. Please do not take meloxicam, ibuprofen, aleve while taking this medication. -     meloxicam (MOBIC) 15 MG tablet; After finishing steroid taper (predisone) start taking 1 tab daily with food for 4-6 weeks. Do not take with ibuprofen or aleve.  Episode of dizziness Resolved at time of exam but sounds like mostly likely evening orthostatic hypotension. Discussed if this should happen again check BP/pulse, if low plan to reduce HCTZ by 1/2 in AM, push water, recheck/monitor. Follow up if recurrent or any new sx.    Over 30 minutes of exam, counseling, chart review and critical decision making was performed Future Appointments  Date Time Provider [place]  02/23/2022 11:30 AM Liane Comber, NP [facility] None  05/30/2022 11:00 AM Unk Pinto, MD [facility] None  10/25/2022 10:50 AM Shamleffer, Melanie Crazier, MD  [facility] None    Subjective:  Emily Jenkins is a 76 y.o. female with hx of htn, aortic atherosclerosis, PSVT, MNG who presents for acute visit for dizziness and sweating at night.   She reports was having above sx for several weeks intermittently in the evening but that this has resolved in the last 2 weeks. She notes would particularly get dizzy when standing up quickly. She denies daytime sx.  She has been taking telmisartan 80 mg and HCTZ 25 mg both in AM. Hasn't checked BP/pulse in the evening.   Sciatica ongoing since last year is actually her main concern today. Has been taking meloxicam 7.5 mg daily without benefit. Has seen Dr. Lorin Mercy in the past for this, had XR in 2018, MRI in 2020 without notable impingement. She denies falls or other injury since that time. Was typically having intermittent sx, progressive and now daily pain in the last few weeks. Intermittent radicular sx down her leg, burning to her feet (worse at night lying flat), intermittently a sense of weakness in that extremity.     ------------------------------------------  Recently had CT chest in 01/2021 showing several ill defined nodules and ground glass opacity had CT follow up in 05/2021 was stable, was recommended 12-18 month non-contrast CT follow up favoring post infections/inflammatory etiology.   Also incidentally noted mass in thyroid area; US showed 1.7 cm right inferior enlarged parathyroid versus parathyroid adenoma. Denies hx of renal stones, does have osteoporosis -DEX12/2019 - R hip T improved from -2.8 to -2.5. Her calcium and pTH have been normal. Dr. Kelton Pillar now following, felt most  likely thyroid nodule.   Lab Results  Component Value Date   PTH 45 10/31/2021   CALCIUM 9.4 10/31/2021   CAION 1.05 (L) 04/27/2008     Medication Review: Current Outpatient Medications on File Prior to Visit  Medication Sig Dispense Refill   aspirin 81 MG tablet Take 81 mg by mouth every evening.       cholecalciferol (VITAMIN D) 1000 units tablet Take 5,000 Units by mouth daily.      famotidine (PEPCID) 20 MG tablet Take 1 tab twice daily prior to breakfast and dinner for stomach acid as directed. 180 tablet 1   hydrochlorothiazide (HYDRODIURIL) 25 MG tablet Take 1 tablet daily for BP and Fluid Retention /Ankle Swelling 90 tablet 3   psyllium (METAMUCIL) 58.6 % packet Take 1 packet by mouth daily.     rosuvastatin (CRESTOR) 5 MG tablet TAKE 1 TABLET BY MOUTH ONCE DAILY FOR CHOLESTEROL 90 tablet 0   telmisartan (MICARDIS) 80 MG tablet Take 1 tablet by mouth once daily for blood pressure 90 tablet 0   vitamin C (ASCORBIC ACID) 500 MG tablet Take 500 mg by mouth daily.     No current facility-administered medications on file prior to visit.    Allergies  Allergen Reactions   Escitalopram Itching   Fosamax [Alendronate Sodium] Itching   Levofloxacin     Headache, chest pain, muscle aches   Sulfa Antibiotics Itching   Tramadol Itching    Current Problems (verified) Patient Active Problem List   Diagnosis Date Noted   Left sided sciatica 09/08/2021   Aortic atherosclerosis ([place]) - CT chest 05/13/2021 05/14/2021   Multinodular goiter 04/29/2021   PSVT (paroxysmal supraventricular tachycardia) ([place]) 03/10/2021   Abnormal ultrasound of parathyroid gland 01/24/2021   Pulmonary nodules 01/12/2021   Bilateral primary osteoarthritis of knee 08/20/2019   Overweight (BMI 25.0-29.9) 10/29/2015   Osteoporosis 06/07/2015   GERD  03/04/2015   Medication management 01/05/2014   Essential hypertension 12/02/2013   Mixed hyperlipidemia 12/02/2013   Abnormal glucose 12/02/2013   Vitamin D deficiency 12/02/2013   Varicose veins of both lower extremities 09/16/2012    SURGICAL HISTORY She  has a past surgical history that includes Cholecystectomy; Tubal ligation; Ankle surgery; and left heart catheterization with coronary angiogram (N/A, 07/29/2014). FAMILY HISTORY Her family history includes  Cancer in her father; Diabetes in her father; Heart disease (age of onset: 86) in her mother; Hyperlipidemia in her sister; Hypertension in her mother and sister. SOCIAL HISTORY She  reports that she has never smoked. She has never used smokeless tobacco. She reports that she does not drink alcohol and does not use drugs.   Review of Systems  Constitutional:  Negative for malaise/fatigue and weight loss.  HENT:  Negative for congestion, hearing loss, sore throat and tinnitus.   Eyes:  Negative for blurred vision and double vision.  Respiratory:  Negative for cough, sputum production, shortness of breath and wheezing.   Cardiovascular:  Negative for chest pain, palpitations, orthopnea, claudication, leg swelling and PND.  Gastrointestinal:  Negative for abdominal pain, blood in stool, constipation, diarrhea, heartburn, melena, nausea and vomiting.  Genitourinary: Negative.   Musculoskeletal:  Positive for back pain (left SI area tenderness) and joint pain (R knee, ortho follows, needs replaced). Negative for falls and myalgias.  Skin:  Negative for rash.  Neurological:  Positive for tingling (intermittent left radicular, foot tingling/burning, worse at night when supine) and weakness (intermittent LLE). Negative for dizziness (was having in evening with position changes, resolved  x 2 weeks), sensory change and headaches.  Endo/Heme/Allergies:  Negative for environmental allergies and polydipsia.  Psychiatric/Behavioral:  Negative for depression, memory loss, substance abuse and suicidal ideas. The patient is not nervous/anxious and does not have insomnia (intermittent).   All other systems reviewed and are negative.   Objective:     Today's Vitals   12/28/21 1135  BP: 130/80  Pulse: 90  Resp: 16  Temp: 97.9 F (36.6 C)  SpO2: 96%  Weight: 156 lb (70.8 kg)  Height: 5\' 5"  (1.651 m)   Body mass index is 25.96 kg/m.  General appearance: alert, no distress, WD/WN, AA elder  female HEENT: normocephalic, sclerae anicteric, TMs pearly, nares patent, no discharge or erythema, pharynx normal Oral cavity: MMM, no lesions Neck: supple, no lymphadenopathy, no thyromegaly, no palpable masses Heart: RRR, normal S1, S2, no murmurs, no positional dizziness on exam today. Neg carotid bruits.  Lungs: CTA bilaterally, no wheezes, rhonchi, or rales Abdomen: +bs, soft, non tender, non distended, no masses, no hepatomegaly, no splenomegaly Musculoskeletal: no swelling, no obvious deformity. Lumbar ROM in tact in all spheres, exquisite left SI tenderness, negative straight leg raise though poor flexibility bil hamstrings and some left IT band tightness/tenderness along lateral leg and lateral knee.  Extremities: no edema, no cyanosis, no clubbing Pulses: 2+ symmetric, upper and lower extremities, normal cap refill Neurological: alert, oriented x 3, CN2-12 intact, strength normal upper extremities and lower extremities, sensation normal throughout, DTRs 2+ throughout, no cerebellar signs, gait mildly antalgic Psychiatric: normal affect, behavior normal, pleasant   Medicare Attestation I have personally reviewed: The patient's medical and social history Their use of alcohol, tobacco or illicit drugs Their current medications and supplements The patient's functional ability including ADLs,fall risks, home safety risks, cognitive, and hearing and visual impairment Diet and physical activities Evidence for depression or mood disorders  The patient's weight, height, BMI, and visual acuity have been recorded in the chart.  I have made referrals, counseling, and provided education to the patient based on review of the above and I have provided the patient with a written personalized care plan for preventive services.     Izora Ribas, NP   12/28/2021

## 2021-12-28 ENCOUNTER — Encounter: Payer: Self-pay | Admitting: Adult Health

## 2021-12-28 ENCOUNTER — Ambulatory Visit (INDEPENDENT_AMBULATORY_CARE_PROVIDER_SITE_OTHER): Payer: Medicare HMO | Admitting: Adult Health

## 2021-12-28 ENCOUNTER — Other Ambulatory Visit: Payer: Self-pay

## 2021-12-28 VITALS — BP 130/80 | HR 90 | Temp 97.9°F | Resp 16 | Ht 65.0 in | Wt 156.0 lb

## 2021-12-28 DIAGNOSIS — R42 Dizziness and giddiness: Secondary | ICD-10-CM | POA: Diagnosis not present

## 2021-12-28 DIAGNOSIS — M5432 Sciatica, left side: Secondary | ICD-10-CM

## 2021-12-28 MED ORDER — PREDNISONE 20 MG PO TABS: ORAL_TABLET | ORAL | 0 refills | Status: DC

## 2021-12-28 MED ORDER — MELOXICAM 15 MG PO TABS: ORAL_TABLET | ORAL | 0 refills | Status: DC

## 2021-12-28 MED ORDER — MELOXICAM 7.5 MG PO TABS: ORAL_TABLET | ORAL | 0 refills | Status: DC

## 2021-12-28 MED ORDER — TIZANIDINE HCL 4 MG PO TABS: 2.0000 mg | ORAL_TABLET | Freq: Three times a day (TID) | ORAL | 0 refills | Status: AC | PRN

## 2021-12-28 NOTE — Patient Instructions (Addendum)
Sciatica Rehab °Ask your health care provider which exercises are safe for you. Do exercises exactly as told by your health care provider and adjust them as directed. It is normal to feel mild stretching, pulling, tightness, or discomfort as you do these exercises. Stop right away if you feel sudden pain or your pain gets worse. Do not begin these exercises until told by your health care provider. °Stretching and range-of-motion exercises °These exercises warm up your muscles and joints and improve the movement and flexibility of your hips and back. These exercises also help to relieve pain, numbness, and tingling. °Sciatic nerve glide °Sit in a chair with your head facing down toward your chest. Place your hands behind your back. Let your shoulders slump forward. °Slowly straighten one of your legs while you tilt your head back as if you are looking toward the ceiling. Only straighten your leg as far as you can without making your symptoms worse. °Hold this position for __________ seconds. °Slowly return to the starting position. °Repeat with your other leg. °Repeat __________ times. Complete this exercise __________ times a day. °Knee to chest with hip adduction and internal rotation ° °Lie on your back on a firm surface with both legs straight. °Bend one of your knees and move it up toward your chest until you feel a gentle stretch in your lower back and buttock. Then, move your knee toward the shoulder that is on the opposite side from your leg. This is hip adduction and internal rotation. °Hold your leg in this position by holding on to the front of your knee. °Hold this position for __________ seconds. °Slowly return to the starting position. °Repeat with your other leg. °Repeat __________ times. Complete this exercise __________ times a day. °Prone extension on elbows ° °Lie on your abdomen on a firm surface. A bed may be too soft for this exercise. °Prop yourself up on your elbows. °Use your arms to help  lift your chest up until you feel a gentle stretch in your abdomen and your lower back. °This will place some of your body weight on your elbows. If this is uncomfortable, try stacking pillows under your chest. °Your hips should stay down, against the surface that you are lying on. Keep your hip and back muscles relaxed. °Hold this position for __________ seconds. °Slowly relax your upper body and return to the starting position. °Repeat __________ times. Complete this exercise __________ times a day. °Strengthening exercises °These exercises build strength and endurance in your back. Endurance is the ability to use your muscles for a long time, even after they get tired. °Pelvic tilt °This exercise strengthens the muscles that lie deep in the abdomen. °Lie on your back on a firm surface. Bend your knees and keep your feet flat on the floor. °Tense your abdominal muscles. Tip your pelvis up toward the ceiling and flatten your lower back into the floor. °To help with this exercise, you may place a small towel under your lower back and try to push your back into the towel. °Hold this position for __________ seconds. °Let your muscles relax completely before you repeat this exercise. °Repeat __________ times. Complete this exercise __________ times a day. °Alternating arm and leg raises ° °Get on your hands and knees on a firm surface. If you are on a hard floor, you may want to use padding, such as an exercise mat, to cushion your knees. °Line up your arms and legs. Your hands should be directly below your shoulders,   and your knees should be directly below your hips. °Lift your left leg behind you. At the same time, raise your right arm and straighten it in front of you. °Do not lift your leg higher than your hip. °Do not lift your arm higher than your shoulder. °Keep your abdominal and back muscles tight. °Keep your hips facing the ground. °Do not arch your back. °Keep your balance carefully, and do not hold your  breath. °Hold this position for __________ seconds. °Slowly return to the starting position. °Repeat with your right leg and your left arm. °Repeat __________ times. Complete this exercise __________ times a day. °Posture and body mechanics °Good posture and healthy body mechanics can help to relieve stress in your body's tissues and joints. Body mechanics refers to the movements and positions of your body while you do your daily activities. Posture is part of body mechanics. Good posture means: °Your spine is in its natural S-curve position (neutral). °Your shoulders are pulled back slightly. °Your head is not tipped forward. °Follow these guidelines to improve your posture and body mechanics in your everyday activities. °Standing ° °When standing, keep your spine neutral and your feet about hip width apart. Keep a slight bend in your knees. Your ears, shoulders, and hips should line up. °When you do a task in which you stand in one place for a long time, place one foot up on a stable object that is 2-4 inches (5-10 cm) high, such as a footstool. This helps keep your spine neutral. °Sitting ° °When sitting, keep your spine neutral and keep your feet flat on the floor. Use a footrest, if necessary, and keep your thighs parallel to the floor. Avoid rounding your shoulders, and avoid tilting your head forward. °When working at a desk or a computer, keep your desk at a height where your hands are slightly lower than your elbows. Slide your chair under your desk so you are close enough to maintain good posture. °When working at a computer, place your monitor at a height where you are looking straight ahead and you do not have to tilt your head forward or downward to look at the screen. °Resting °When lying down and resting, avoid positions that are most painful for you. °If you have pain with activities such as sitting, bending, stooping, or squatting, lie in a position in which your body does not bend very much. For  example, avoid curling up on your side with your arms and knees near your chest (fetal position). °If you have pain with activities such as standing for a long time or reaching with your arms, lie with your spine in a neutral position and bend your knees slightly. Try the following positions: °Lying on your side with a pillow between your knees. °Lying on your back with a pillow under your knees. °Lifting ° °When lifting objects, keep your feet at least shoulder width apart and tighten your abdominal muscles. °Bend your knees and hips and keep your spine neutral. It is important to lift using the strength of your legs, not your back. Do not lock your knees straight out. °Always ask for help to lift heavy or awkward objects. °This information is not intended to replace advice given to you by your health care provider. Make sure you discuss any questions you have with your health care provider. °Document Revised: 03/21/2019 Document Reviewed: 12/19/2018 °[employer] Patient Education © 2022 [employer] ° °

## 2022-01-04 ENCOUNTER — Encounter: Payer: Self-pay | Admitting: Internal Medicine

## 2022-02-23 ENCOUNTER — Encounter: Payer: Self-pay | Admitting: Adult Health

## 2022-02-23 ENCOUNTER — Ambulatory Visit (INDEPENDENT_AMBULATORY_CARE_PROVIDER_SITE_OTHER): Payer: Medicare HMO | Admitting: Adult Health

## 2022-02-23 ENCOUNTER — Other Ambulatory Visit: Payer: Self-pay

## 2022-02-23 VITALS — BP 140/80 | HR 90 | Temp 97.3°F | Wt 157.0 lb

## 2022-02-23 DIAGNOSIS — M810 Age-related osteoporosis without current pathological fracture: Secondary | ICD-10-CM

## 2022-02-23 DIAGNOSIS — R918 Other nonspecific abnormal finding of lung field: Secondary | ICD-10-CM | POA: Diagnosis not present

## 2022-02-23 DIAGNOSIS — R6889 Other general symptoms and signs: Secondary | ICD-10-CM | POA: Diagnosis not present

## 2022-02-23 DIAGNOSIS — E663 Overweight: Secondary | ICD-10-CM

## 2022-02-23 DIAGNOSIS — R002 Palpitations: Secondary | ICD-10-CM

## 2022-02-23 DIAGNOSIS — E782 Mixed hyperlipidemia: Secondary | ICD-10-CM

## 2022-02-23 DIAGNOSIS — I700 Atherosclerosis of aorta: Secondary | ICD-10-CM

## 2022-02-23 DIAGNOSIS — R7309 Other abnormal glucose: Secondary | ICD-10-CM | POA: Diagnosis not present

## 2022-02-23 DIAGNOSIS — R9389 Abnormal findings on diagnostic imaging of other specified body structures: Secondary | ICD-10-CM | POA: Diagnosis not present

## 2022-02-23 DIAGNOSIS — E042 Nontoxic multinodular goiter: Secondary | ICD-10-CM | POA: Diagnosis not present

## 2022-02-23 DIAGNOSIS — Z0001 Encounter for general adult medical examination with abnormal findings: Secondary | ICD-10-CM | POA: Diagnosis not present

## 2022-02-23 DIAGNOSIS — Z79899 Other long term (current) drug therapy: Secondary | ICD-10-CM | POA: Diagnosis not present

## 2022-02-23 DIAGNOSIS — R69 Illness, unspecified: Secondary | ICD-10-CM | POA: Diagnosis not present

## 2022-02-23 DIAGNOSIS — F5105 Insomnia due to other mental disorder: Secondary | ICD-10-CM | POA: Diagnosis not present

## 2022-02-23 DIAGNOSIS — E559 Vitamin D deficiency, unspecified: Secondary | ICD-10-CM | POA: Diagnosis not present

## 2022-02-23 DIAGNOSIS — I10 Essential (primary) hypertension: Secondary | ICD-10-CM | POA: Diagnosis not present

## 2022-02-23 DIAGNOSIS — F418 Other specified anxiety disorders: Secondary | ICD-10-CM

## 2022-02-23 MED ORDER — HYDROCHLOROTHIAZIDE 12.5 MG PO TABS: ORAL_TABLET | ORAL | 3 refills | Status: DC

## 2022-02-23 MED ORDER — TRAZODONE HCL 50 MG PO TABS: ORAL_TABLET | ORAL | 0 refills | Status: DC

## 2022-02-23 NOTE — Progress Notes (Signed)
?MEDICARE ANNUAL WELLNESS VISIT AND FOLLOW UP ? ?Assessment:  ? ?Diagnoses and all orders for this visit: ? ?Annual Medicare Wellness Visit ?Due annually  ?Health maintenance reviewed ? ?Atherosclerosis of aorta ([place]) - per CT 6/3/202 ?Control blood pressure, cholesterol, glucose, increase exercise.  ? ?Varicose veins of both lower extremities, unspecified whether complicated ?- weight loss discussed, continue compression stockings and elevation ? ?Essential hypertension ?Continue medication;  ?Monitor blood pressure at home; call if consistently over 130/80 ?Continue DASH diet.   ?Reminder to go to the ER if any CP, SOB, nausea, dizziness, severe HA, changes vision/speech, left arm numbness and tingling and jaw pain. ? ?Gastroesophageal reflux disease, esophagitis presence not specified ?Well managed on current medications; PPI taper plan discussed ?Discussed diet, avoiding triggers and other lifestyle changes ? ?Age-related osteoporosis without current pathological fracture ?- get DEXA 2 years, didn't tolerate fosamax, but R hip T improved to osteopenia last check, aggressive lifestyle recommendations,  ?Endocrinology is following due to parathyroid and fosamax intolerance ?continue Vit D and Ca, weight bearing exercises reviewed ? ?Vitamin D deficiency ?At goal at recent check; continue to recommend supplementation for goal of 60-100 ?Defer vitamin D level ? ?Abnormal glucose ?Discussed diet/exercise, weight management  ?A1C q61m? ?Mixed hyperlipidemia ?On rosuvastatin with LDL at goal  ?Continue low cholesterol diet and exercise.  ?Check lipid panel.  ? ?Medication management ?CBC, CMP/GFR, magnesium  ? ?Overweight (BMI 25.0-29.9) ?Long discussion about weight loss, diet, and exercise ?Recommended diet heavy in fruits and veggies and low in animal meats, cheeses, and dairy products, appropriate calorie intake ?Discussed appropriate weight for height and initial goal  ?Follow up at next visit ? ?Knee  arthritis ?Follow up Dr. GBerenice Primas getting injections ? ?Pulmonary nodules ?Follow up Ct ordered, due 05/2022-11/2022 ? ?Parathryoid enlarged vs adenoma ?With normal calcium but osteoporosis ?No hx of renal calculi ?Check PTH; follow up bone density; long discussion today; will plan to refer to endocrinology for review and recommendations.  ? ?Palpitations ?Benign holter 02/2021, improved sx per patient, monitor ?Add BB if needed ? ?Insomnia associated with depression/anxiety ?After discussion she is interested in trying trazodone ?Start new medication as prescribed ?Stress management techniques discussed, increase water, good sleep hygiene discussed, increase exercise, and increase veggies.  ?Follow up 6-8 weeks, call the office if any new AE's from medications and we will switch them ? ?Orders Placed This Encounter  ?Procedures  ? CT Chest Wo Contrast  ? CBC with Differential/Platelet  ? COMPLETE METABOLIC PANEL WITH GFR  ? Magnesium  ? Lipid panel  ? TSH  ? Hemoglobin A1c  ? ? ? ?Over 40 minutes of exam, counseling, chart review and critical decision making was performed ?Future Appointments  ?Date Time Provider [place] ?05/30/2022 11:00 AM MUnk Pinto MD [facility] None  ?10/25/2022 10:50 AM Shamleffer, IMelanie Crazier MD [facility] None  ?02/28/2023 11:00 AM CLiane Comber NP [facility] None  ? ? ?Plan:  ? ?During the course of the visit the patient was educated and counseled about appropriate screening and preventive services including:  ? ?Pneumococcal vaccine  ?Prevnar 13 ?Influenza vaccine ?Td vaccine ?Screening electrocardiogram ?Bone densitometry screening ?Colorectal cancer screening ?Diabetes screening ?Glaucoma screening ?Nutrition counseling  ?Advanced directives: requested ? ? ?Subjective:  ?Emily DARKis a 76y.o. female who presents for Medicare Annual Wellness Visit and 3 month follow up. She has Varicose veins of both lower extremities; Essential hypertension; Mixed  hyperlipidemia; Abnormal glucose; Vitamin D deficiency; Medication management; GERD ; Osteoporosis; Overweight (  BMI 25.0-29.9); Bilateral primary osteoarthritis of knee; Pulmonary nodules; Abnormal ultrasound of parathyroid gland; PSVT (paroxysmal supraventricular tachycardia) ([place]); Multinodular goiter; Aortic atherosclerosis ([place]) - CT chest 05/13/2021; Left sided sciatica; and Insomnia secondary to depression with anxiety on their problem list. ? ?Today she admits has been feeling down, thinking about bad relationship she was in, feeling more stress trying to sell her home and taking longer than expected. She reports having a lot of difficulty with sleep.  ? ?Recently had CT chest in 01/2021 showing several ill defined nodules, was stable in 05/13/2021 and felt most likely inflammatory but was recommended 12-18 month follow up, discussed and patient does wish to pursue.   ? ?Also incidentally noted mass in thyroid area; US showed 1.7 cm right inferior enlarged parathyroid versus parathyroid adenoma. She was evaluated by endocrinology Dr. Kelton Pillar who felt most likely thyroid nodule, planning to follow annually  ? ?Denies hx of renal stones, does have osteoporosis -DEX12/2019 - R hip T improved from -2.8 to -2.4 on last DEXA 02/2021. Her calcium has been normal.  ? ?Lab Results  ?Component Value Date  ? PTH 45 10/31/2021  ? CALCIUM 9.4 10/31/2021  ? CAION 1.05 (L) 04/27/2008  ? ?She is followed by Dr. Lorin Mercy for bil knee arthritis, getting injections, needs surgery but has been deferring.  ? ?she has a diagnosis of GERD which is currently managed by famotidine BID, off of protonix with well controlled sx reported by patient.  ? ?BMI is Body mass index is 26.13 kg/m?., she has been working on diet and exercise, walking up and down her driveway in the morning when cool, 15 min or so.  ?Wt Readings from Last 3 Encounters:  ?02/23/22 157 lb (71.2 kg)  ?12/28/21 156 lb (70.8 kg)  ?10/31/21 154 lb 3.2 oz (69.9 kg)  ? ?She  was reporting palpitations, had holter in 02/2021 that showed 2 runs of non-sustained VT. She reports sx have improved since that time.  ? ? Her blood pressure has been controlled at home (130/70s), today their BP is BP: 140/80 ?She does workout. She denies chest pain, shortness of breath. She reports less night time dizziness after reducing HCTZ to 12.5 mg.  ? ?She has aortic atherosclerosis per CT 05/13/2021.  ? ?She is on cholesterol medication (rosuvastatin 5 mg daily and tolerating well) and denies myalgias. Her cholesterol is at goal. The cholesterol last visit was:   ?Lab Results  ?Component Value Date  ? CHOL 162 09/08/2021  ? HDL 68 09/08/2021  ? [place] 80 09/08/2021  ? TRIG 65 09/08/2021  ? CHOLHDL 2.4 09/08/2021  ? ? She has not been working on diet and exercise for glucose management, and denies increased appetite, nausea, paresthesia of the feet, polydipsia, polyuria and visual disturbances. Last A1C in the office was:  ?Lab Results  ?Component Value Date  ? HGBA1C 5.7 (H) 05/30/2021  ? ?Last GFR: ?Lab Results  ?Component Value Date  ? GFRAA 98 05/30/2021  ? ?Patient is on Vitamin D supplement.   ?Lab Results  ?Component Value Date  ? VD25OH 70 05/30/2021  ?   ? ? ?Medication Review: ?Current Outpatient Medications on File Prior to Visit  ?Medication Sig Dispense Refill  ? aspirin 81 MG tablet Take 81 mg by mouth every evening.     ? cholecalciferol (VITAMIN D) 1000 units tablet Take 5,000 Units by mouth daily.     ? famotidine (PEPCID) 20 MG tablet Take 1 tab twice daily prior to breakfast and  dinner for stomach acid as directed. 180 tablet 1  ? psyllium (METAMUCIL) 58.6 % packet Take 1 packet by mouth daily.    ? rosuvastatin (CRESTOR) 5 MG tablet TAKE 1 TABLET BY MOUTH ONCE DAILY FOR CHOLESTEROL 90 tablet 0  ? telmisartan (MICARDIS) 80 MG tablet Take 1 tablet by mouth once daily for blood pressure 90 tablet 0  ? tiZANidine (ZANAFLEX) 4 MG tablet Take 0.5-1 tablets (2-4 mg total) by mouth every 8  (eight) hours as needed for muscle spasms. Do not drive while taking this medication. 90 tablet 0  ? vitamin C (ASCORBIC ACID) 500 MG tablet Take 500 mg by mouth daily.    ? ?No current facility-administered medications on fi

## 2022-02-23 NOTE — Patient Instructions (Signed)
Goals   ? ?  Blood Pressure < 130/80   ?  Exercise 20 min daily    ? ?  ? ? ?You can ask your pharmacy and/or insurance about the shingrix vaccine to see if this is covered - 2 shots, 2-6 months apart. You may have a copay for this vaccine, so please check on the cost before getting it. ? ? ? ? ?Trazodone Tablets ?What is this medication? ?TRAZODONE (TRAZ oh done) treats depression and sleep. It increases the amount of serotonin in the brain, a hormone that helps regulate mood. ?This medicine may be used for other purposes; ask your health care provider or pharmacist if you have questions. ?COMMON BRAND NAME(S): Desyrel ?What should I tell my care team before I take this medication? ?They need to know if you have any of these conditions: ?Attempted suicide or thinking about it ?Bipolar disorder ?Bleeding problems ?Glaucoma ?Heart disease, or previous heart attack ?Irregular heart beat ?Kidney or liver disease ?Low levels of sodium in the blood ?An unusual or allergic reaction to trazodone, other medications, foods, dyes or preservatives ?Pregnant or trying to get pregnant ?Breast-feeding ?How should I use this medication? ?Take this medication by mouth with a glass of water. Follow the directions on the prescription label. Take this medication shortly after a meal or a light snack. Take your medication at regular intervals. Do not take your medication more often than directed. Do not stop taking this medication suddenly except upon the advice of your care team. Stopping this medication too quickly may cause serious side effects or your condition may worsen. ?A special MedGuide will be given to you by the pharmacist with each prescription and refill. Be sure to read this information carefully each time. ?Talk to your care team regarding the use of this medication in children. Special care may be needed. ?Overdosage: If you think you have taken too much of this medicine contact a poison control center or emergency  room at once. ?NOTE: This medicine is only for you. Do not share this medicine with others. ?What if I miss a dose? ?If you miss a dose, take it as soon as you can. If it is almost time for your next dose, take only that dose. Do not take double or extra doses. ?What may interact with this medication? ?Do not take this medication with any of the following: ?Certain medications for fungal infections like fluconazole, itraconazole, ketoconazole, posaconazole, voriconazole ?Cisapride ?Dronedarone ?Linezolid ?MAOIs like Carbex, Eldepryl, Marplan, Nardil, and Parnate ?Mesoridazine ?Methylene blue (injected into a vein) ?Pimozide ?Saquinavir ?Thioridazine ?This medication may also interact with the following: ?Alcohol ?Antiviral medications for HIV or AIDS ?Aspirin and aspirin-like medications ?Barbiturates like phenobarbital ?Certain medications for blood pressure, heart disease, irregular heart beat ?Certain medications for depression, anxiety, or psychotic disturbances ?Certain medications for migraine headache like almotriptan, eletriptan, frovatriptan, naratriptan, rizatriptan, sumatriptan, zolmitriptan ?Certain medications for seizures like carbamazepine and phenytoin ?Certain medications for sleep ?Certain medications that treat or prevent blood clots like dalteparin, enoxaparin, warfarin ?Digoxin ?Fentanyl ?Lithium ?NSAIDS, medications for pain and inflammation, like ibuprofen or naproxen ?Other medications that prolong the QT interval (cause an abnormal heart rhythm) like dofetilide ?Rasagiline ?Supplements like St. John's wort, kava kava, valerian ?Tramadol ?Tryptophan ?This list may not describe all possible interactions. Give your health care provider a list of all the medicines, herbs, non-prescription drugs, or dietary supplements you use. Also tell them if you smoke, drink alcohol, or use illegal drugs. Some items may interact with  your medicine. ?What should I watch for while using this medication? ?Tell  your care team if your symptoms do not get better or if they get worse. Visit your care team for regular checks on your progress. Because it may take several weeks to see the full effects of this medication, it is important to continue your treatment as prescribed by your care team. ?Watch for new or worsening thoughts of suicide or depression. This includes sudden changes in mood, behaviors, or thoughts. These changes can happen at any time but are more common in the beginning of treatment or after a change in dose. Call your care team right away if you experience these thoughts or worsening depression. ?Manic episodes may happen in patients with bipolar disorder who take this medication. Watch for changes in feelings or behaviors such as feeling anxious, nervous, agitated, panicky, irritable, hostile, aggressive, impulsive, severely restless, overly excited and hyperactive, or trouble sleeping. These changes can happen at any time but are more common in the beginning of treatment or after a change in dose. Call your care team right away if you notice any of these symptoms. ?You may get drowsy or dizzy. Do not drive, use machinery, or do anything that needs mental alertness until you know how this medication affects you. Do not stand or sit up quickly, especially if you are an older patient. This reduces the risk of dizzy or fainting spells. Alcohol may interfere with the effect of this medication. Avoid alcoholic drinks. ?This medication may cause dry eyes and blurred vision. If you wear contact lenses you may feel some discomfort. Lubricating drops may help. See your eye doctor if the problem does not go away or is severe. ?Your mouth may get dry. Chewing sugarless gum, sucking hard candy and drinking plenty of water may help. Contact your care team if the problem does not go away or is severe. ?What side effects may I notice from receiving this medication? ?Side effects that you should report to your care team  as soon as possible: ?Allergic reactions--skin rash, itching, hives, swelling of the face, lips, tongue, or throat ?Bleeding--bloody or black, tar-like stools, red or dark brown urine, vomiting blood or brown material that looks like coffee grounds, small, red or purple spots on skin, unusual bleeding or bruising ?Heart rhythm changes--fast or irregular heartbeat, dizziness, feeling faint or lightheaded, chest pain, trouble breathing ?Low blood pressure--dizziness, feeling faint or lightheaded, blurry vision ?Low sodium level--muscle weakness, fatigue, dizziness, headache, confusion ?Prolonged or painful erection ?Serotonin syndrome--irritability, confusion, fast or irregular heartbeat, muscle stiffness, twitching muscles, sweating, high fever, seizures, chills, vomiting, diarrhea ?Sudden eye pain or change in vision such as blurry vision, seeing halos around lights, vision loss ?Thoughts of suicide or self-harm, worsening mood, feelings of depression ?Side effects that usually do not require medical attention (report to your care team if they continue or are bothersome): ?Change in sex drive or performance ?Constipation ?Dizziness ?Drowsiness ?Dry mouth ?This list may not describe all possible side effects. Call your doctor for medical advice about side effects. You may report side effects to FDA at 1-800-FDA-1088. ?Where should I keep my medication? ?Keep out of the reach of children and pets. ?Store at room temperature between 15 and 30 degrees C (59 to 86 degrees F). Protect from light. Keep container tightly closed. Throw away any unused medication after the expiration date. ?NOTE: This sheet is a summary. It may not cover all possible information. If you have questions about this medicine, talk  to your doctor, pharmacist, or health care provider. ?? 2022 Elsevier/Gold Standard (2020-11-17 00:00:00) ? ?

## 2022-02-24 LAB — LIPID PANEL
Cholesterol: 179 mg/dL (ref <200)
HDL: 76 mg/dL (ref >=50)
LDL Cholesterol (Calc): 88 mg/dL (calc)
Non-HDL Cholesterol (Calc): 103 mg/dL (calc) (ref <130)
Total CHOL/HDL Ratio: 2.4 (calc) (ref <5.0)
Triglycerides: 64 mg/dL (ref <150)

## 2022-02-24 LAB — CBC WITH DIFFERENTIAL/PLATELET
Absolute Monocytes: 211 cells/uL (ref 200–950)
Basophils Absolute: 42 cells/uL (ref 0–200)
Basophils Relative: 1.3 %
Eosinophils Absolute: 141 cells/uL (ref 15–500)
Eosinophils Relative: 4.4 %
HCT: 38.8 % (ref 35.0–45.0)
Hemoglobin: 12.6 g/dL (ref 11.7–15.5)
Lymphs Abs: 806 cells/uL — ABNORMAL LOW (ref 850–3900)
MCH: 26.6 pg — ABNORMAL LOW (ref 27.0–33.0)
MCHC: 32.5 g/dL (ref 32.0–36.0)
MCV: 81.9 fL (ref 80.0–100.0)
MPV: 11.7 fL (ref 7.5–12.5)
Monocytes Relative: 6.6 %
Neutro Abs: 2000 cells/uL (ref 1500–7800)
Neutrophils Relative %: 62.5 %
Platelets: 204 10*3/uL (ref 140–400)
RBC: 4.74 10*6/uL (ref 3.80–5.10)
RDW: 13.6 % (ref 11.0–15.0)
Total Lymphocyte: 25.2 %
WBC: 3.2 10*3/uL — ABNORMAL LOW (ref 3.8–10.8)

## 2022-02-24 LAB — COMPLETE METABOLIC PANEL WITH GFR
AG Ratio: 1.6 (calc) (ref 1.0–2.5)
ALT: 13 U/L (ref 6–29)
AST: 19 U/L (ref 10–35)
Albumin: 4.3 g/dL (ref 3.6–5.1)
Alkaline phosphatase (APISO): 80 U/L (ref 37–153)
BUN/Creatinine Ratio: 11 (calc) (ref 6–22)
BUN: 12 mg/dL (ref 7–25)
CO2: 29 mmol/L (ref 20–32)
Calcium: 9.7 mg/dL (ref 8.6–10.4)
Chloride: 105 mmol/L (ref 98–110)
Creat: 1.06 mg/dL — ABNORMAL HIGH (ref 0.60–1.00)
Globulin: 2.7 g/dL (calc) (ref 1.9–3.7)
Glucose, Bld: 95 mg/dL (ref 65–99)
Potassium: 4.1 mmol/L (ref 3.5–5.3)
Sodium: 142 mmol/L (ref 135–146)
Total Bilirubin: 0.6 mg/dL (ref 0.2–1.2)
Total Protein: 7 g/dL (ref 6.1–8.1)
eGFR: 54 mL/min/{1.73_m2} — ABNORMAL LOW (ref >=60)

## 2022-02-24 LAB — TSH: TSH: 1.75 mIU/L (ref 0.40–4.50)

## 2022-02-24 LAB — MAGNESIUM: Magnesium: 2 mg/dL (ref 1.5–2.5)

## 2022-02-24 LAB — HEMOGLOBIN A1C
Hgb A1c MFr Bld: 6 % of total Hgb — ABNORMAL HIGH (ref <5.7)
Mean Plasma Glucose: 126 mg/dL
eAG (mmol/L): 7 mmol/L

## 2022-02-25 DIAGNOSIS — E785 Hyperlipidemia, unspecified: Secondary | ICD-10-CM | POA: Diagnosis not present

## 2022-02-25 DIAGNOSIS — Z008 Encounter for other general examination: Secondary | ICD-10-CM | POA: Diagnosis not present

## 2022-02-25 DIAGNOSIS — M5430 Sciatica, unspecified side: Secondary | ICD-10-CM | POA: Diagnosis not present

## 2022-02-25 DIAGNOSIS — K219 Gastro-esophageal reflux disease without esophagitis: Secondary | ICD-10-CM | POA: Diagnosis not present

## 2022-02-25 DIAGNOSIS — I951 Orthostatic hypotension: Secondary | ICD-10-CM | POA: Diagnosis not present

## 2022-02-25 DIAGNOSIS — R69 Illness, unspecified: Secondary | ICD-10-CM | POA: Diagnosis not present

## 2022-02-25 DIAGNOSIS — I10 Essential (primary) hypertension: Secondary | ICD-10-CM | POA: Diagnosis not present

## 2022-02-25 DIAGNOSIS — M1990 Unspecified osteoarthritis, unspecified site: Secondary | ICD-10-CM | POA: Diagnosis not present

## 2022-02-25 DIAGNOSIS — G8929 Other chronic pain: Secondary | ICD-10-CM | POA: Diagnosis not present

## 2022-02-25 DIAGNOSIS — F439 Reaction to severe stress, unspecified: Secondary | ICD-10-CM | POA: Diagnosis not present

## 2022-02-25 DIAGNOSIS — Z791 Long term (current) use of non-steroidal anti-inflammatories (NSAID): Secondary | ICD-10-CM | POA: Diagnosis not present

## 2022-02-25 DIAGNOSIS — K5900 Constipation, unspecified: Secondary | ICD-10-CM | POA: Diagnosis not present

## 2022-02-25 DIAGNOSIS — G4700 Insomnia, unspecified: Secondary | ICD-10-CM | POA: Diagnosis not present

## 2022-04-09 ENCOUNTER — Other Ambulatory Visit: Payer: Self-pay | Admitting: Adult Health

## 2022-04-09 ENCOUNTER — Other Ambulatory Visit: Payer: Self-pay | Admitting: Nurse Practitioner

## 2022-04-09 DIAGNOSIS — K219 Gastro-esophageal reflux disease without esophagitis: Secondary | ICD-10-CM

## 2022-04-19 ENCOUNTER — Encounter: Payer: Self-pay | Admitting: Adult Health

## 2022-04-19 ENCOUNTER — Ambulatory Visit (INDEPENDENT_AMBULATORY_CARE_PROVIDER_SITE_OTHER): Payer: Medicare HMO | Admitting: Adult Health

## 2022-04-19 VITALS — BP 124/68 | HR 72 | Temp 97.7°F | Wt 153.4 lb

## 2022-04-19 DIAGNOSIS — F5105 Insomnia due to other mental disorder: Secondary | ICD-10-CM | POA: Diagnosis not present

## 2022-04-19 DIAGNOSIS — K219 Gastro-esophageal reflux disease without esophagitis: Secondary | ICD-10-CM

## 2022-04-19 DIAGNOSIS — E663 Overweight: Secondary | ICD-10-CM

## 2022-04-19 DIAGNOSIS — F418 Other specified anxiety disorders: Secondary | ICD-10-CM

## 2022-04-19 DIAGNOSIS — N289 Disorder of kidney and ureter, unspecified: Secondary | ICD-10-CM

## 2022-04-19 DIAGNOSIS — I10 Essential (primary) hypertension: Secondary | ICD-10-CM

## 2022-04-19 DIAGNOSIS — R69 Illness, unspecified: Secondary | ICD-10-CM | POA: Diagnosis not present

## 2022-04-19 MED ORDER — TRAZODONE HCL 50 MG PO TABS: ORAL_TABLET | ORAL | 0 refills | Status: DC

## 2022-04-19 MED ORDER — HYDROCHLOROTHIAZIDE 12.5 MG PO TABS: ORAL_TABLET | ORAL | 3 refills | Status: DC

## 2022-04-19 MED ORDER — TELMISARTAN 80 MG PO TABS: 80.0000 mg | ORAL_TABLET | Freq: Every day | ORAL | 1 refills | Status: DC

## 2022-04-19 MED ORDER — FAMOTIDINE 20 MG PO TABS: ORAL_TABLET | ORAL | 3 refills | Status: DC

## 2022-04-19 NOTE — Progress Notes (Signed)
Assessment and Plan: ? ?Emily Jenkins was seen today for follow-up. ? ?Diagnoses and all orders for this visit: ? ?Insomnia secondary to depression with anxiety ?Sx much improved, well controlled with trazodone 50 mg ?Continue PRN ?Follow up as scheduled/sooner if any worsening sx ?-     traZODone (DESYREL) 50 MG tablet; 1 tablet for sleep and mood, take 1 hour prior to bedtime. ? ?Overweight (BMI 25.0-29.9) ?Long discussion about weight loss, diet, and exercise ?Recommended diet heavy in fruits and veggies and low in animal meats, cheeses, and dairy products, appropriate calorie intake ?Discussed appropriate weight for height  ?Follow up at next visit ? ?Essential hypertension ?Having some positional dizziness, suspect orthostatic hypotension, also some fatigue ?Advised to try holding HCTZ 12.5 mg x 1 week, continue checking BPs daily and pushing water intake, DASH diet and exercise ?Ok to remain off of HCTZ if this improves fatigue/dizziness and BPs remain controlled around 130/80 ?-     BASIC METABOLIC PANEL WITH GFR ?-     telmisartan (MICARDIS) 80 MG tablet; Take 1 tablet (80 mg total) by mouth daily. for blood pressure ? ?Abnormal renal function ?Denies NSAIDs, has increased fluid intake  ?-     BASIC METABOLIC PANEL WITH GFR ? ?Gastroesophageal reflux disease without esophagitis ?-     famotidine (PEPCID) 20 MG tablet; Take 1 tablet 2 x /day before Bkfst & Supper to Prevent Indigestion & Heartburn ? ?Further disposition pending results of labs.  ?Discussed med's effects and SE's.   ?Over 30 minutes of exam, counseling, chart review, and critical decision making was performed.  ? ?Future Appointments  ?Date Time Provider [place]  ?05/30/2022 10:00 AM Mull, Townsend Roger, NP [facility] None  ?10/25/2022 10:50 AM Shamleffer, Melanie Crazier, MD [facility] None  ?02/28/2023 11:00 AM Liane Comber, NP [facility] None   ? ? ?------------------------------------------------------------------------------------------------------------------ ? ? ?HPI ?BP 124/68   Pulse 72   Temp 97.7 ?F (36.5 ?C)   Wt 153 lb 6.4 oz (69.6 kg)   SpO2 99%   BMI 25.53 kg/m?  ?76 y.o.female presents for 2 month follow up on mood.  ? 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  ? ?Her blood pressure has been controlled at home (110s-120s/50-s60s), today their BP is BP: 124/68 ? She does workout. She denies chest pain, shortness of breath, does endorse some intermittent dizziness, positional, worse in AM. Endorses some ongoing fatigue, sense of dragging.  ?She is currently taking telmisartan 80 mg daily and HCTZ 12.5 mg daily.  ? ?She reports has been pushing water intake, minimum 3 bottles, aiming for 4, improved from previous, denies NSAID use.  ?Lab Results  ?Component Value Date  ? EGFR 54 (L) 02/23/2022  ? EGFR 68 09/08/2021  ? ?BMI is Body mass index is 25.53 kg/m?., she has been working on diet and exercise for insulin resistance/prediabetes.  ?Wt Readings from Last 3 Encounters:  ?04/19/22 153 lb 6.4 oz (69.6 kg)  ?02/23/22 157 lb (71.2 kg)  ?12/28/21 156 lb (70.8 kg)  ? ?Lab Results  ?Component Value Date  ? HGBA1C 6.0 (H) 02/23/2022  ? ? ? ?Past Medical History:  ?Diagnosis Date  ? Depression 09/22/2017  ? 09/21/2017 PHQ-9: 9  ? DJD (degenerative joint disease)   ? GERD (gastroesophageal  reflux disease)   ? Hyperlipidemia   ? Hypertension   ? Pre-diabetes   ? RLS (restless legs syndrome)   ?  ? ?Allergies  ?Allergen Reactions  ? Escitalopram Itching  ? Fosamax [Alendronate Sodium] Itching  ? Levofloxacin   ?  Headache, chest pain, muscle aches  ? Sulfa Antibiotics Itching  ? Tramadol Itching   ? ? ?Current Outpatient Medications on File Prior to Visit  ?Medication Sig  ? aspirin 81 MG tablet Take 81 mg by mouth every evening.   ? cholecalciferol (VITAMIN D) 1000 units tablet Take 5,000 Units by mouth daily.   ? hydrochlorothiazide (HYDRODIURIL) 12.5 MG tablet Take 1 tablet daily in the morning for BP and Fluid Retention /Ankle Swelling  ? psyllium (METAMUCIL) 58.6 % packet Take 1 packet by mouth daily.  ? rosuvastatin (CRESTOR) 5 MG tablet Take  1 tablet  Daily for Cholesterol                                                     /                                     TAKE                       BY                        MOUTH                         ONCE DAILY  ? tiZANidine (ZANAFLEX) 4 MG tablet Take 0.5-1 tablets (2-4 mg total) by mouth every 8 (eight) hours as needed for muscle spasms. Do not drive while taking this medication.  ? vitamin C (ASCORBIC ACID) 500 MG tablet Take 500 mg by mouth daily.  ? ?No current facility-administered medications on file prior to visit.  ? ? ?ROS: all negative except above.  ? ?Physical Exam: ? ?BP 124/68   Pulse 72   Temp 97.7 ?F (36.5 ?C)   Wt 153 lb 6.4 oz (69.6 kg)   SpO2 99%   BMI 25.53 kg/m?  ? ?General Appearance: Well nourished, in no apparent distress. ?Eyes: PERRLA, conjunctiva no swelling or erythema ?ENT/Mouth: mask in place; Hearing normal.  ?Neck: Supple ?Respiratory: Respiratory effort normal, BS equal bilaterally without rales, rhonchi, wheezing or stridor.  ?Cardio: RRR with no MRGs. Brisk peripheral pulses without edema.  ?Lymphatics: Non tender without lymphadenopathy.  ?Musculoskeletal: no obvious deformity; normal gait.  ?Skin: Warm, dry without rashes, lesions, ecchymosis.  ?Neuro: Normal muscle tone ?Psych: Awake and oriented X 3, normal affect, Insight and Judgment appropriate.  ?  ? ?Izora Ribas, NP ?12:01 PM ?[facility] ? ?

## 2022-04-20 ENCOUNTER — Other Ambulatory Visit: Payer: Self-pay | Admitting: Adult Health

## 2022-04-20 LAB — BASIC METABOLIC PANEL WITH GFR
BUN: 13 mg/dL (ref 7–25)
CO2: 31 mmol/L (ref 20–32)
Calcium: 9.9 mg/dL (ref 8.6–10.4)
Chloride: 105 mmol/L (ref 98–110)
Creat: 1 mg/dL (ref 0.60–1.00)
Glucose, Bld: 109 mg/dL — ABNORMAL HIGH (ref 65–99)
Potassium: 4.5 mmol/L (ref 3.5–5.3)
Sodium: 142 mmol/L (ref 135–146)
eGFR: 58 mL/min/{1.73_m2} — ABNORMAL LOW (ref >=60)

## 2022-05-25 ENCOUNTER — Ambulatory Visit
Admission: RE | Admit: 2022-05-25 | Discharge: 2022-05-25 | Disposition: A | Discharge status: Home / Self Care | Payer: Medicare HMO | Source: Ambulatory Visit | Attending: Adult Health | Admitting: Adult Health

## 2022-05-25 DIAGNOSIS — I700 Atherosclerosis of aorta: Secondary | ICD-10-CM | POA: Diagnosis not present

## 2022-05-25 DIAGNOSIS — R911 Solitary pulmonary nodule: Secondary | ICD-10-CM | POA: Diagnosis not present

## 2022-05-25 DIAGNOSIS — R918 Other nonspecific abnormal finding of lung field: Secondary | ICD-10-CM

## 2022-05-29 NOTE — Progress Notes (Unsigned)
COMPLETE PHYSICAL  Assessment:   Diagnoses and all orders for this visit:  Encounter for general medical examination with abnormal findings Due annually   Atherosclerosis of aorta ([place]) - per CT 6/3/202 Control blood pressure, cholesterol, glucose, increase exercise.   Varicose veins of both lower extremities, unspecified whether complicated - weight loss discussed, continue compression stockings and elevation  Essential hypertension Continue medication; HCTZ 12.5 mg daily Monitor blood pressure at home; call if consistently over 130/80 Continue DASH diet.   Reminder to go to the ER if any CP, SOB, nausea, dizziness, severe HA, changes vision/speech, left arm numbness and tingling and jaw pain.  Gastroesophageal reflux disease, esophagitis presence not specified Well managed on current medications; PPI taper plan discussed Discussed diet, avoiding triggers and other lifestyle changes  Age-related osteoporosis without current pathological fracture - get DEXA 2 years, didn't tolerate fosamax, but R hip T improved to osteopenia last check, aggressive lifestyle recommendations,  Endocrinology is following due to parathyroid and fosamax intolerance continue Vit D and Ca, weight bearing exercises reviewed  Vitamin D deficiency At goal at recent check; continue to recommend supplementation for goal of 60-100 Defer vitamin D level  Abnormal glucose Discussed diet/exercise, weight management  A1C q64m Mixed hyperlipidemia On rosuvastatin with LDL at goal  Continue low cholesterol diet and exercise.  Check lipid panel.   Medication management CBC, CMP/GFR, magnesium   Overweight (BMI 25.0-29.9) Long discussion about weight loss, diet, and exercise Recommended diet heavy in fruits and veggies and low in animal meats, cheeses, and dairy products, appropriate calorie intake Discussed appropriate weight for height and initial goal  Follow up at next visit  Knee  arthritis Follow up Dr. GBerenice Primas has gotten injections in the past, not recently  Pulmonary nodules Follow up CT was benign no further imagining required  Parathryoid enlarged vs adenoma Continue to follow with endocrinology- Dr SKelton Pillar Palpitations Have improved, continue to monitor  Insomnia associated with depression/anxiety After discussion she is interested in trying trazodone Start new medication as prescribed Stress management techniques discussed, increase water, good sleep hygiene discussed, increase exercise, and increase veggies.  Follow up 6-8 weeks, call the office if any new AE's from medications and we will switch them  Screening for ischemic heart disease -EKG  Screening for hematuria/proteinuria - Routine UA with reflex microscopic - Microalbumin/creatinine urine ratio    Over 40 minutes of exam, counseling, chart review and critical decision making was performed Future Appointments  Date Time Provider [place] 10/25/2022 10:50 AM Shamleffer, IMelanie Crazier MD [facility] None  02/28/2023 11:00 AM CDarrol Jump NP [facility] None  05/31/2023 10:00 AM WAlycia Rossetti NP [facility] None    Advanced directives: requested   Subjective:  Emily SPACEKis a 76y.o. female who presents for Medicare Annual Wellness Visit and 3 month follow up. She has Varicose veins of both lower extremities; Essential hypertension; Mixed hyperlipidemia; Abnormal glucose; Vitamin D deficiency; Medication management; GERD ; Osteoporosis; Overweight (BMI 25.0-29.9); Bilateral primary osteoarthritis of knee; Abnormal ultrasound of parathyroid gland; Palpitations; Multinodular goiter; [place] chest 05/13/2021; Left sided sciatica; and Insomnia secondary to depression with anxiety on their problem list.  Today she admits has been feeling down, thinking about bad relationship she was in, feeling more stress trying to sell her home and taking  longer than expected. She reports having a lot of difficulty with sleep, but has improved slightly. Does activities to keep her mind busy and this has been  helping.   Recently had CT chest in 01/2021 showing several ill defined nodules, was stable in 05/13/2021 and felt most likely inflammatory but was recommended 12-18 month follow up. CT 05/25/22 showed  1. Multiple small ill-defined pulmonary nodules, either stable or smaller than on prior exams. These are likely post infectious or inflammatory. There are no new or enlarging nodules. No further follow-up is needed. 2. Mild aortic atherosclerosis.  Coronary artery calcifications.  Also incidentally noted mass in thyroid area; US showed 1.7 cm right inferior enlarged parathyroid versus parathyroid adenoma. She was evaluated by endocrinology Dr. Kelton Pillar who felt most likely thyroid nodule, planning to follow annually in November.  Denies hx of renal stones, does have osteoporosis -DEX12/2019 - R hip T improved from -2.8 to -2.4 on last DEXA 02/2021. Her calcium has been normal.   Lab Results  Component Value Date   PTH 45 10/31/2021   CALCIUM 9.9 04/19/2022   CAION 1.05 (L) 04/27/2008   She is followed by Dr. Lorin Mercy for bil knee arthritis,has received injections in the past but not recently, needs surgery but has been deferring.   she has a diagnosis of GERD which is currently managed by famotidine BID, off of protonix with well controlled sx reported by patient.   BMI is Body mass index is 25.63 kg/m., she has been working on diet and exercise, walking up and down her driveway in the morning when cool, 15 min or so. Also has stationary bike Wt Readings from Last 3 Encounters:  05/30/22 154 lb (69.9 kg)  04/19/22 153 lb 6.4 oz (69.6 kg)  02/23/22 157 lb (71.2 kg)   She was reporting palpitations, had holter in 02/2021 that showed 2 runs of non-sustained VT. She reports sx have improved since that time.    Her blood pressure has been  controlled at home (130/70s), today their BP is BP: 138/70  BP Readings from Last 3 Encounters:  05/30/22 138/70  04/19/22 124/68  02/23/22 140/80  She does workout. She denies chest pain, shortness of breath.Continues on HCTZ 12.'5mg'$   She has aortic atherosclerosis per CT 05/13/2021.   She is on cholesterol medication (rosuvastatin 5 mg daily and tolerating well) and denies myalgias. Her cholesterol is at goal. The cholesterol last visit was:   Lab Results  Component Value Date   CHOL 179 02/23/2022   HDL 76 02/23/2022   LDLCALC 88 02/23/2022   TRIG 64 02/23/2022   CHOLHDL 2.4 02/23/2022    She has not been working on diet and exercise for glucose management, and denies increased appetite, nausea, paresthesia of the feet, polydipsia, polyuria and visual disturbances. Last A1C in the office was:  Lab Results  Component Value Date   HGBA1C 6.0 (H) 02/23/2022   Last GFR: Lab Results  Component Value Date   GFRAA 98 05/30/2021   Patient is on Vitamin D supplement.   Lab Results  Component Value Date   VD25OH 48 05/30/2021       Medication Review: Current Outpatient Medications on File Prior to Visit  Medication Sig Dispense Refill   aspirin 81 MG tablet Take 81 mg by mouth every evening.      cholecalciferol (VITAMIN D) 1000 units tablet Take 5,000 Units by mouth daily.      famotidine (PEPCID) 20 MG tablet Take 1 tablet 2 x /day before Bkfst & Supper to Prevent Indigestion & Heartburn 180 tablet 3   hydrochlorothiazide (HYDRODIURIL) 12.5 MG tablet Take 1 tablet as needed in the morning for  BP and Fluid Retention /Ankle Swelling 90 tablet 3   psyllium (METAMUCIL) 58.6 % packet Take 1 packet by mouth daily.     rosuvastatin (CRESTOR) 5 MG tablet Take  1 tablet  Daily for Cholesterol                                                     /                                     TAKE                       BY                        MOUTH                         ONCE DAILY 90 tablet 3    telmisartan (MICARDIS) 80 MG tablet Take 1 tablet (80 mg total) by mouth daily. for blood pressure 90 tablet 1   tiZANidine (ZANAFLEX) 4 MG tablet Take 0.5-1 tablets (2-4 mg total) by mouth every 8 (eight) hours as needed for muscle spasms. Do not drive while taking this medication. 90 tablet 0   traZODone (DESYREL) 50 MG tablet 1 tablet for sleep and mood, take 1 hour prior to bedtime. 90 tablet 0   vitamin C (ASCORBIC ACID) 500 MG tablet Take 500 mg by mouth daily.     No current facility-administered medications on file prior to visit.    Allergies  Allergen Reactions   Escitalopram Itching   Fosamax [Alendronate Sodium] Itching   Levofloxacin     Headache, chest pain, muscle aches   Sulfa Antibiotics Itching   Tramadol Itching    Current Problems (verified) Patient Active Problem List   Diagnosis Date Noted   Insomnia secondary to depression with anxiety 02/23/2022   Left sided sciatica 09/08/2021   Aortic atherosclerosis ([place]) - CT chest 05/13/2021 05/14/2021   Multinodular goiter 04/29/2021   Palpitations 03/10/2021   Abnormal ultrasound of parathyroid gland 01/24/2021   Bilateral primary osteoarthritis of knee 08/20/2019   Overweight (BMI 25.0-29.9) 10/29/2015   Osteoporosis 06/07/2015   GERD  03/04/2015   Medication management 01/05/2014   Essential hypertension 12/02/2013   Mixed hyperlipidemia 12/02/2013   Abnormal glucose 12/02/2013   Vitamin D deficiency 12/02/2013   Varicose veins of both lower extremities 09/16/2012    Screening Tests Immunization History  Administered Date(s) Administered   DTaP 12/11/2000   Influenza Split 10/24/2013   Influenza, High Dose Seasonal PF 09/17/2014, 09/09/2015, 08/21/2016, 09/21/2017, 10/14/2018, 10/13/2019, 09/23/2020, 09/08/2021   Moderna Sars-Covid-2 Vaccination 01/18/2020, 02/18/2020, 11/09/2020   Pneumococcal Conjugate-13 08/21/2016   Pneumococcal Polysaccharide-23 12/11/2002, 01/08/2018   Tdap 07/11/2012   Zoster,  Live 08/06/2013   Health Maintenance  Topic Date Due   Zoster Vaccines- Shingrix (1 of 2) Never done   COVID-19 Vaccine (4 - Booster for Moderna series) 01/04/2021   INFLUENZA VACCINE  07/11/2022   TETANUS/TDAP  07/11/2022   MAMMOGRAM  11/25/2022   DEXA SCAN  03/05/2023   Pneumonia Vaccine 44+ Years old  Completed   Hepatitis C Screening  Completed   HPV VACCINES  Aged Out   COLONOSCOPY (Pts 45-31yr Insurance coverage will need to be confirmed)  Discontinued   Zostavax: 2014 Shingrix: interested, check with insurance Covid 19: 3/3, 2021, moderna    Last colonoscopy: 08/2020, Dr. MCollene Mares polyps were non-adenomatous, DONE   Last EGD: 03/2017  Last mammogram: 11/25/21- negative repeat 1 year Last pap smear/pelvic exam: pap 2014 DONE, pelvic 2017 DEXA: 2017, 11/2018, 02/2021 - R hip T improved from -2.8 to -2.5 to -2.4, fosamax intolerance (itching) -   Names of Other Physician/Practitioners you currently use: 1. [facility]- here for primary care 2. [facility] [place], last visit 2022 3. Dr. RHarrington [facility] dentist, last visit 2023, still going [id number] issues  Patient Care Team: McUnk PintoMD as PCP - General (Internal Medicine) JoCelestia KhatODGeorg[facility]Opt[facility]) WiShela LeffMD as Consulting Physician (Orthopedic Surgery) MaJuanita CraverMD as Consulting Physician (Gastroenterology) McBurnell BlanksMD as Consulting Physician (Cardiology)  SURGICAL HISTORY She  has a past surgical history that includes Cholecystectomy; Tubal ligation; Ankle surgery; and left heart catheterization with coronary angiogram (N/A, 07/29/2014). FAMILY HISTORY Her family history includes Cancer in her father; Diabetes in her father; Heart disease (age of onset: 5041in her mother; Hyperlipidemia in her sister; Hypertension in her mother and sister. SOCIAL HISTORY She  reports that she has never smoked. She has never  used smokeless tobacco. She reports that she does not drink alcohol and does not use drugs.      Review of Systems  Constitutional:  Negative for malaise/fatigue and weight loss.  HENT:  Negative for congestion, hearing loss, sore throat and tinnitus.   Eyes:  Negative for blurred vision and double vision.  Respiratory:  Negative for cough, sputum production, shortness of breath and wheezing.   Cardiovascular:  Negative for chest pain, palpitations, orthopnea, claudication, leg swelling and PND.  Gastrointestinal:  Negative for abdominal pain, blood in stool, constipation, diarrhea, heartburn, melena, nausea and vomiting.  Genitourinary: Negative.   Musculoskeletal:  Positive for joint pain (R knee). Negative for falls and myalgias.  Skin:  Negative for rash.  Neurological:  Negative for dizziness, tingling, sensory change, weakness and headaches.  Endo/Heme/Allergies:  Positive for environmental allergies. Negative for polydipsia.  Psychiatric/Behavioral:  Negative for depression, memory loss, substance abuse and suicidal ideas. The patient is not nervous/anxious and does not have insomnia (intermittent).   All other systems reviewed and are negative.    Objective:     Today's Vitals   05/30/22 0951  BP: 138/70  Pulse: 86  Temp: 98 F (36.7 C)  SpO2: 93%  Weight: 154 lb (69.9 kg)  Height: '5\' 5"'$  (1.651 m)   Body mass index is 25.63 kg/m.  General appearance: alert, no distress, WD/WN, female HEENT: normocephalic, sclerae anicteric, TMs pearly, nares patent, no discharge or erythema, pharynx normal Oral cavity: MMM, no lesions Neck: supple, no lymphadenopathy, no thyromegaly, no palpable masses Heart: RRR, normal S1, S2, no murmurs Lungs: CTA bilaterally, no wheezes, rhonchi, or rales Abdomen: +bs, soft, non tender, non distended, no masses, no hepatomegaly, no splenomegaly Musculoskeletal: nontender, no swelling, no obvious deformity. Decreased strength 4/5 of legs  bilaterally Extremities: no edema, no cyanosis, no clubbing Pulses: 2+ symmetric, upper and lower extremities, normal cap refill Neurological: alert, oriented x 3, CN2-12 intact, strength normal upper extremities and lower extremities, sensation normal throughout, DTRs 2+ throughout, no cerebellar signs, gait antalgic, slow with cane  Psychiatric: normal affect, behavior  normal, pleasant  EKG: NSR, NO ST CHANGES   Emily Rossetti, NP   05/30/2022

## 2022-05-30 ENCOUNTER — Encounter: Payer: Medicare HMO | Admitting: Internal Medicine

## 2022-05-30 ENCOUNTER — Ambulatory Visit (INDEPENDENT_AMBULATORY_CARE_PROVIDER_SITE_OTHER): Payer: Medicare HMO | Admitting: Nurse Practitioner

## 2022-05-30 ENCOUNTER — Encounter: Payer: Self-pay | Admitting: Nurse Practitioner

## 2022-05-30 VITALS — BP 138/70 | HR 86 | Temp 98.0°F | Ht 65.0 in | Wt 154.0 lb

## 2022-05-30 DIAGNOSIS — R7309 Other abnormal glucose: Secondary | ICD-10-CM

## 2022-05-30 DIAGNOSIS — R9389 Abnormal findings on diagnostic imaging of other specified body structures: Secondary | ICD-10-CM | POA: Diagnosis not present

## 2022-05-30 DIAGNOSIS — E559 Vitamin D deficiency, unspecified: Secondary | ICD-10-CM | POA: Diagnosis not present

## 2022-05-30 DIAGNOSIS — Z1389 Encounter for screening for other disorder: Secondary | ICD-10-CM

## 2022-05-30 DIAGNOSIS — E663 Overweight: Secondary | ICD-10-CM | POA: Diagnosis not present

## 2022-05-30 DIAGNOSIS — E782 Mixed hyperlipidemia: Secondary | ICD-10-CM | POA: Diagnosis not present

## 2022-05-30 DIAGNOSIS — I8393 Asymptomatic varicose veins of bilateral lower extremities: Secondary | ICD-10-CM

## 2022-05-30 DIAGNOSIS — Z79899 Other long term (current) drug therapy: Secondary | ICD-10-CM | POA: Diagnosis not present

## 2022-05-30 DIAGNOSIS — F418 Other specified anxiety disorders: Secondary | ICD-10-CM

## 2022-05-30 DIAGNOSIS — I10 Essential (primary) hypertension: Secondary | ICD-10-CM | POA: Diagnosis not present

## 2022-05-30 DIAGNOSIS — Z0000 Encounter for general adult medical examination without abnormal findings: Secondary | ICD-10-CM

## 2022-05-30 DIAGNOSIS — R002 Palpitations: Secondary | ICD-10-CM

## 2022-05-30 DIAGNOSIS — Z136 Encounter for screening for cardiovascular disorders: Secondary | ICD-10-CM

## 2022-05-30 DIAGNOSIS — Z0001 Encounter for general adult medical examination with abnormal findings: Secondary | ICD-10-CM

## 2022-05-30 DIAGNOSIS — K219 Gastro-esophageal reflux disease without esophagitis: Secondary | ICD-10-CM | POA: Diagnosis not present

## 2022-05-30 DIAGNOSIS — I700 Atherosclerosis of aorta: Secondary | ICD-10-CM

## 2022-05-30 DIAGNOSIS — M170 Bilateral primary osteoarthritis of knee: Secondary | ICD-10-CM

## 2022-05-31 LAB — CBC WITH DIFFERENTIAL/PLATELET
Absolute Monocytes: 186 cells/uL — ABNORMAL LOW (ref 200–950)
Basophils Absolute: 30 cells/uL (ref 0–200)
Basophils Relative: 1.1 %
Eosinophils Absolute: 127 cells/uL (ref 15–500)
Eosinophils Relative: 4.7 %
HCT: 39.4 % (ref 35.0–45.0)
Hemoglobin: 12.8 g/dL (ref 11.7–15.5)
Lymphs Abs: 710 cells/uL — ABNORMAL LOW (ref 850–3900)
MCH: 26.7 pg — ABNORMAL LOW (ref 27.0–33.0)
MCHC: 32.5 g/dL (ref 32.0–36.0)
MCV: 82.1 fL (ref 80.0–100.0)
MPV: 12 fL (ref 7.5–12.5)
Monocytes Relative: 6.9 %
Neutro Abs: 1647 cells/uL (ref 1500–7800)
Neutrophils Relative %: 61 %
Platelets: 246 10*3/uL (ref 140–400)
RBC: 4.8 10*6/uL (ref 3.80–5.10)
RDW: 13.8 % (ref 11.0–15.0)
Total Lymphocyte: 26.3 %
WBC: 2.7 10*3/uL — ABNORMAL LOW (ref 3.8–10.8)

## 2022-05-31 LAB — TSH: TSH: 1.83 mIU/L (ref 0.40–4.50)

## 2022-05-31 LAB — COMPLETE METABOLIC PANEL WITH GFR
AG Ratio: 1.7 (calc) (ref 1.0–2.5)
ALT: 6 U/L (ref 6–29)
AST: 14 U/L (ref 10–35)
Albumin: 4.2 g/dL (ref 3.6–5.1)
Alkaline phosphatase (APISO): 77 U/L (ref 37–153)
BUN: 10 mg/dL (ref 7–25)
CO2: 28 mmol/L (ref 20–32)
Calcium: 9.9 mg/dL (ref 8.6–10.4)
Chloride: 106 mmol/L (ref 98–110)
Creat: 0.75 mg/dL (ref 0.60–1.00)
Globulin: 2.5 g/dL (calc) (ref 1.9–3.7)
Glucose, Bld: 108 mg/dL — ABNORMAL HIGH (ref 65–99)
Potassium: 4.1 mmol/L (ref 3.5–5.3)
Sodium: 144 mmol/L (ref 135–146)
Total Bilirubin: 0.6 mg/dL (ref 0.2–1.2)
Total Protein: 6.7 g/dL (ref 6.1–8.1)
eGFR: 82 mL/min/{1.73_m2} (ref >=60)

## 2022-05-31 LAB — LIPID PANEL
Cholesterol: 177 mg/dL (ref <200)
HDL: 74 mg/dL (ref >=50)
LDL Cholesterol (Calc): 90 mg/dL (calc)
Non-HDL Cholesterol (Calc): 103 mg/dL (calc) (ref <130)
Total CHOL/HDL Ratio: 2.4 (calc) (ref <5.0)
Triglycerides: 51 mg/dL (ref <150)

## 2022-05-31 LAB — URINALYSIS, ROUTINE W REFLEX MICROSCOPIC
Bilirubin Urine: NEGATIVE
Glucose, UA: NEGATIVE
Hgb urine dipstick: NEGATIVE
Ketones, ur: NEGATIVE
Leukocytes,Ua: NEGATIVE
Nitrite: NEGATIVE
Protein, ur: NEGATIVE
Specific Gravity, Urine: 1.01 (ref 1.001–1.035)
pH: 5.5 (ref 5.0–8.0)

## 2022-05-31 LAB — MICROALBUMIN / CREATININE URINE RATIO
Creatinine, Urine: 59 mg/dL (ref 20–275)
Microalb Creat Ratio: 27 mcg/mg creat (ref <30)
Microalb, Ur: 1.6 mg/dL

## 2022-05-31 LAB — HEMOGLOBIN A1C
Hgb A1c MFr Bld: 5.6 % of total Hgb (ref <5.7)
Mean Plasma Glucose: 114 mg/dL
eAG (mmol/L): 6.3 mmol/L

## 2022-05-31 LAB — MAGNESIUM: Magnesium: 2.1 mg/dL (ref 1.5–2.5)

## 2022-05-31 LAB — VITAMIN D 25 HYDROXY (VIT D DEFICIENCY, FRACTURES): Vit D, 25-Hydroxy: 49 ng/mL (ref 30–100)

## 2022-10-17 ENCOUNTER — Other Ambulatory Visit: Payer: Self-pay

## 2022-10-17 DIAGNOSIS — I10 Essential (primary) hypertension: Secondary | ICD-10-CM

## 2022-10-17 MED ORDER — TELMISARTAN 80 MG PO TABS: 80.0000 mg | ORAL_TABLET | Freq: Every day | ORAL | 1 refills | Status: DC

## 2022-10-25 ENCOUNTER — Encounter: Payer: Self-pay | Admitting: Internal Medicine

## 2022-10-25 ENCOUNTER — Ambulatory Visit (INDEPENDENT_AMBULATORY_CARE_PROVIDER_SITE_OTHER): Payer: Medicare HMO | Admitting: Internal Medicine

## 2022-10-25 VITALS — BP 112/76 | HR 80 | Ht 65.0 in | Wt 148.0 lb

## 2022-10-25 DIAGNOSIS — E042 Nontoxic multinodular goiter: Secondary | ICD-10-CM | POA: Diagnosis not present

## 2022-10-25 NOTE — Progress Notes (Signed)
Name: Emily Jenkins  MRN/ DOB: [id number], 05-Dec-1946    Age/ Sex: 76 y.o., female     PCP: Unk Pinto, MD   Reason for Endocrinology Evaluation: Learned     Initial Endocrinology Clinic Visit: 04/29/21    PATIENT IDENTIFIER: Emily Jenkins is a 76 y.o., female with a past medical history of .HTN, SVT, Osteoporosis and mixed hyperlipidemia. She has followed with [facility] since 04/29/2021 for consultative assistance with management of her MNG.   HISTORICAL SUMMARY:  She was noted to have a right inferior thyroid nodule on CT scan of the chest in 01/2021. This promoted a thyroid ultrasound demonstrating a questionable right parathyroid adenoma vs thyroid nodule. She also has sub-centimeter thyroid nodules    Of note the pt has normal calcium and parathyroid hormone level.    No FH of thyroid disease   SUBJECTIVE:     Today (10/25/2022):  Emily Jenkins is here for a follow up on Multinodular goiter.   Denies local neck symptoms but has occasional dysphagia  Has heartburn symptoms  Has occasional palpitations  Has chronic constipation, takes laxatives Denies tremors unless rarely    HISTORY:  Past Medical History:  Past Medical History:  Diagnosis Date   Depression 09/22/2017   09/21/2017 PHQ-9: 9   DJD (degenerative joint disease)    GERD (gastroesophageal reflux disease)    Hyperlipidemia    Hypertension    Pre-diabetes    RLS (restless legs syndrome)    Past Surgical History:  Past Surgical History:  Procedure Laterality Date   ANKLE SURGERY     CHOLECYSTECTOMY     LEFT HEART CATHETERIZATION WITH CORONARY ANGIOGRAM N/A 07/29/2014   Procedure: LEFT HEART CATHETERIZATION WITH CORONARY ANGIOGRAM;  Surgeon: Burnell Blanks, MD;  Location: [facility] CATH LAB;  Service: Cardiovascular;  Laterality: N/A;   TUBAL LIGATION     Social History:  reports that she has never smoked. She has never used smokeless tobacco. She reports that she  does not drink alcohol and does not use drugs. Family History:  Family History  Problem Relation Age of Onset   Heart disease Mother 16       stent   Hypertension Mother    Cancer Father    Diabetes Father    Hyperlipidemia Sister    Hypertension Sister      HOME MEDICATIONS: Allergies as of 10/25/2022       Reactions   Escitalopram Itching   Fosamax [alendronate Sodium] Itching   Levofloxacin    Headache, chest pain, muscle aches   Sulfa Antibiotics Itching   Tramadol Itching        Medication List        Accurate as of October 25, 2022 10:34 AM. If you have any questions, ask your nurse or doctor.          STOP taking these medications    traZODone 50 MG tablet Commonly known as: DESYREL Stopped by: Dorita Sciara, MD       TAKE these medications    ascorbic acid 500 MG tablet Commonly known as: VITAMIN C Take 500 mg by mouth daily.   aspirin 81 MG tablet Take 81 mg by mouth every evening.   cholecalciferol 1000 units tablet Commonly known as: VITAMIN D Take 5,000 Units by mouth daily.   famotidine 20 MG tablet Commonly known as: PEPCID Take 1 tablet 2 x /day before Bkfst & Supper to Prevent Indigestion & Heartburn   hydrochlorothiazide 12.5  MG tablet Commonly known as: HYDRODIURIL Take 1 tablet as needed in the morning for BP and Fluid Retention /Ankle Swelling   psyllium 58.6 % packet Commonly known as: METAMUCIL Take 1 packet by mouth daily.   rosuvastatin 5 MG tablet Commonly known as: CRESTOR Take  1 tablet  Daily for Cholesterol                                                     /                                     TAKE                       BY                        MOUTH                         ONCE DAILY   telmisartan 80 MG tablet Commonly known as: MICARDIS Take 1 tablet (80 mg total) by mouth daily. for blood pressure   tiZANidine 4 MG tablet Commonly known as: Zanaflex Take 0.5-1 tablets (2-4 mg total) by mouth  every 8 (eight) hours as needed for muscle spasms. Do not drive while taking this medication.          OBJECTIVE:   PHYSICAL EXAM: VS: Ht '5\' 5"'$  (1.651 m)   Wt 148 lb (67.1 kg)   BMI 24.63 kg/m    EXAM: General: Pt appears well and is in NAD  Neck: General: Supple without adenopathy. Thyroid: Thyroid size normal.  No goiter or nodules appreciated.  Lungs: Clear with good BS bilat with no rales, rhonchi, or wheezes  Heart: Auscultation: RRR.  Extremities: BL UE: No hand tremors noted  BL LE: No pretibial edema normal   Mental Status: Judgment, insight: Intact Orientation: Oriented to time, place, and person Mood and affect: No depression, anxiety, or agitation     DATA REVIEWED:    Latest Reference Range & Units 05/30/22 10:37  TSH 0.40 - 4.50 mIU/L 1.83   Thyroid Ultrasound 01/20/2021   Estimated total number of nodules >/= 1 cm: 1   Number of spongiform nodules >/=  2 cm not described below (TR1): 0   Number of mixed cystic and solid nodules >/= 1.5 cm not described below ([place]): 0   _________________________________________________________   Along the right inferior thyroid posteriorly, there is a solid oval hypoechoic nodule which appears extrathyroidal measuring 1.7 x 1.5 x 0.9 cm. This has the appearance of an enlarged parathyroid gland or adenoma. This nodule correlates with the chest CT finding.   Within the thyroid there are a few scattered subcentimeter hypoechoic and isoechoic nodules noted, all measuring 8 mm or less in size. These would not meet criteria for any biopsy or follow-up and are not fully described by TI rads criteria.   No hypervascularity.  No regional adenopathy.   IMPRESSION: 1.7 cm right inferior enlarged parathyroid versus parathyroid adenoma. Recommend correlation with calcium and PTH levels for hyperparathyroidism.   Additional benign subcentimeter thyroid nodules noted.   ASSESSMENT / PLAN / RECOMMENDATIONS:    MNG:     -  Patient is clinically and biochemically euthyroid -She has no local neck symptoms -Thyroid ultrasound shows multinodular goiter but none meets criteria for FNA at this time - Will repeat thyroid ultrasound this year         F/U in 1 yr   Signed electronically by: Mack Guise, MD  [facility]  [address] [place], [place] [address] Phone: [phone] FAX: [phone]      CC: Unk Pinto, MD [address] [place] [address] Phone: [phone]  Fax: [phone]   Return to Endocrinology clinic as below: Future Appointments  Date Time Provider [place]  10/25/2022 10:50 AM Brayan Votaw, Melanie Crazier, MD [facility] None  11/14/2022 10:30 AM Unk Pinto, MD [facility] None  02/28/2023 11:00 AM Darrol Jump, NP [facility] None  05/31/2023 10:00 AM Alycia Rossetti, NP [facility] None

## 2022-11-08 ENCOUNTER — Ambulatory Visit
Admission: RE | Admit: 2022-11-08 | Discharge: 2022-11-08 | Disposition: A | Discharge status: Home / Self Care | Payer: Medicare HMO | Source: Ambulatory Visit | Attending: Internal Medicine | Admitting: Internal Medicine

## 2022-11-08 DIAGNOSIS — E042 Nontoxic multinodular goiter: Secondary | ICD-10-CM | POA: Diagnosis not present

## 2022-11-10 ENCOUNTER — Telehealth: Payer: Self-pay | Admitting: Internal Medicine

## 2022-11-10 DIAGNOSIS — E042 Nontoxic multinodular goiter: Secondary | ICD-10-CM

## 2022-11-10 NOTE — Telephone Encounter (Signed)
Spoke to the pt on 11/10/2022 regarding nodule # 4 and the need to proceed with FNA    Narrative & Impression  CLINICAL DATA:  Prior ultrasound follow-up. Follow up multinodular goiter   EXAM: THYROID ULTRASOUND   TECHNIQUE: Ultrasound examination of the thyroid gland and adjacent soft tissues was performed.   COMPARISON:  01/20/2021   FINDINGS: Parenchymal Echotexture: Mildly heterogenous   Isthmus: Normal in size measuring 0.4 cm in diameter, unchanged   Right lobe: Slightly atrophic in size measuring 3.6 x 2.1 x 1.5 cm, previously, 3.9 x 2.0 x 1.7 cm   Left lobe: Normal in size measuring 4.3 x 1.7 x 1.1 cm, previously, 4.5 x 1.4 x 1.4 cm   _________________________________________________________   Estimated total number of nodules >/= 1 cm: 1   Number of spongiform nodules >/=  2 cm not described below (TR1): 0   Number of mixed cystic and solid nodules >/= 1.5 cm not described below ([place]): 0   _________________________________________________________   The approximately 0.5 cm iso/hypoechoic nodule within the left side of the thyroid isthmus (labeled 1) is unchanged compared to the 01/2021 examination and again does not meet criteria to recommend percutaneous sampling or continued dedicated follow-up.   The approximately 0.8 cm hypoechoic nodule in the thyroid isthmus (labeled 2), is unchanged compared to the 01/2021 examination, and again does not meet criteria to recommend percutaneous sampling or continued dedicated follow-up.   Punctate (approximately 0.6 cm) hypoechoic nodule within the mid aspect of the left lobe of the thyroid (labeled 3) has slightly increased in size compared to the 01/2021 examination, previously 0.4 cm however again does not meet criteria to recommend percutaneous sampling or continued dedicated follow-up.   _________________________________________________________   Nodule # 4:   Prior biopsy: No   Location: Right;  Inferior   Maximum size: 2.2 cm; Other 2 dimensions: 1.5 x 1.4 cm, previously, 1.7 x 1.5 x 0.9 cm   Composition: solid/almost completely solid (2)   Echogenicity: hypoechoic (2)   Shape: not taller-than-wide (0)   Margins: extra-thyroidal extension (3)   Echogenic foci: none (0)   ACR TI-RADS total points: 7.   ACR TI-RADS risk category:  TR5 (>/= 7 points).   Significant change in size (>/= 20% in two dimensions and minimal increase of 2 mm): Yes   Change in features: No   Change in ACR TI-RADS risk category: No   ACR TI-RADS recommendations:   **Given size (>/= 1.0 cm) and appearance, fine needle aspiration of this highly suspicious nodule should be considered based on TI-RADS criteria.   _________________________________________________________   The approximately 0.6 cm isoechoic ill-defined nodule/pseudonodule within the mid aspect of the left lobe of the thyroid (labeled 5) is unchanged compared to the 01/2021 examination and again does not meet imaging criteria to recommend percutaneous sampling or continued dedicated follow-up.   IMPRESSION: 1. Similar findings of multinodular goiter. 2. Interval increase in size of dominant exophytic right-sided thyroid nodule versus (favored) parathyroid adenoma. Correlation for symptoms of hyperparathyroidism is advised. Further evaluation with nuclear medicine parathyroid scintigraphy could be performed as indicated. Otherwise, this nodule meets criteria to recommend percutaneous sampling as indicated. 3. None of the additional discretely measured thyroid nodules/cysts meet imaging criteria to recommend percutaneous sampling or continued dedicated follow-up.   Pt in agreement  An order has been placed

## 2022-11-13 ENCOUNTER — Encounter: Payer: Self-pay | Admitting: Internal Medicine

## 2022-11-13 NOTE — Progress Notes (Signed)
History of Present Illness:       This very nice 76 y.o.  [name] presents for 6 month follow up with HTN, HLD, Pre-Diabetes and Vitamin D Deficiency. Patient's GERD is not controlled on her famotidine .       Patient is treated for HTN (1990's) & BP has been controlled at home. Today's BP is at goal - 140/78 . Patient has had no complaints of any cardiac type chest pain, palpitations, dyspnea /orthopnea / PND, dizziness, claudication or dependent edema.      Hyperlipidemia is controlled with diet & low dose Rosuvastatin. Patient denies myalgias or other med SE's. Last Lipids were at goal:  Lab Results  Component Value Date   CHOL 177 05/30/2022   HDL 74 05/30/2022   LDLCALC 90 05/30/2022   TRIG 51 05/30/2022   CHOLHDL 2.4 05/30/2022     Also, the patient has history of PreDiabetes (A1c 5.9% /2013)   and has had no symptoms of reactive hypoglycemia, diabetic polys, paresthesias or visual blurring.  Last A1c was at goal:  Lab Results  Component Value Date   HGBA1C 5.6 05/30/2022                                                        Further, the patient also has history of Vitamin D Deficienc ("41" /2014) and supplements vitamin D without any suspected side-effects. Last vitamin D was near goa  (70-100) :  Lab Results  Component Value Date   VD25OH 49 05/30/2022        Current Outpatient Medications on File Prior to Visit  Medication Sig   aspirin 81 MG tablet Take every evening.    cholecalciferol (VITAMIN D) 1000 units tablet Take 5,000 Units by mouth daily.    pantoprazole (PROTONIX) 40 MG tablet Take 1 tablet daily for Indigestion & Heart burn   psyllium (METAMUCIL) 58.6 % packet Take 1 packet daily.   rosuvastatin (CRESTOR) 5 MG tablet Take 1 tablet daily for cholesterol.   telmisartan (MICARDIS) 80 MG tablet Take 1 tablet daily for blood pressure.   vitamin C (ASCORBIC ACID) 500 MG tablet Take daily.     Allergies  Allergen Reactions   Escitalopram  Itching   Fosamax [Alendronate Sodium] Itching   Levofloxacin     Headache, chest pain, muscle aches   Sulfa Antibiotics Itching   Tramadol Itching     PMHx:   Past Medical History:  Diagnosis Date   Depression 09/22/2017   09/21/2017 PHQ-9: 9   DJD (degenerative joint disease)    GERD (gastroesophageal reflux disease)    Hyperlipidemia    Hypertension    Pre-diabetes    RLS (restless legs syndrome)      Immunization History  Administered Date(s) Administered   DTaP 12/11/2000   Influenza Split 10/24/2013   Influenza, High Dose Seasonal PF 09/17/2014, 09/09/2015, 08/21/2016, 09/21/2017, 10/14/2018, 10/13/2019, 09/23/2020   Moderna SARS-COVID-2 Vaccination 01/18/2020, 02/18/2020   Pneumococcal Conjugate-13 08/21/2016   Pneumococcal Polysaccharide-23 12/11/2002, 01/08/2018   Tdap 07/11/2012   Zoster 08/06/2013     Past Surgical History:  Procedure Laterality Date   ANKLE SURGERY     CHOLECYSTECTOMY     LEFT HEART CATHETERIZATION WITH CORONARY ANGIOGRAM N/A 07/29/2014   Procedure: LEFT HEART CATHETERIZATION WITH CORONARY ANGIOGRAM;  Surgeon: Burnell Blanks, MD;  Location: [facility] CATH LAB;  Service: Cardiovascular;  Laterality: N/A;   TUBAL LIGATION       FHx:    Reviewed / unchanged  SHx:    Reviewed / unchanged    Systems Review:  Constitutional: Denies fever, chills, wt changes, headaches, insomnia, fatigue, night sweats, change in appetite. Eyes: Denies redness, blurred vision, diplopia, discharge, itchy, watery eyes.  ENT: Denies discharge, congestion, post nasal drip, epistaxis, sore throat, earache, hearing loss, dental pain, tinnitus, vertigo, sinus pain, snoring.  CV: Denies chest pain, palpitations, irregular heartbeat, syncope, dyspnea, diaphoresis, orthopnea, PND, claudication or edema. Respiratory: denies cough, dyspnea, DOE, pleurisy, hoarseness, laryngitis, wheezing.  Gastrointestinal: Denies dysphagia, odynophagia, heartburn, reflux, water  brash, abdominal pain or cramps, nausea, vomiting, bloating, diarrhea, constipation, hematemesis, melena, hematochezia  or hemorrhoids. Genitourinary: Denies dysuria, frequency, urgency, nocturia, hesitancy, discharge, hematuria or flank pain. Musculoskeletal: Denies arthralgias, myalgias, stiffness, jt. swelling, pain, limping or strain/sprain.  Skin: Denies pruritus, rash, hives, warts, acne, eczema or change in skin lesion(s). Neuro: No weakness, tremor, incoordination, spasms, paresthesia or pain. Psychiatric: Denies confusion, memory loss or sensory loss. Endo: Denies change in weight, skin or hair change.  Heme/Lymph: No excessive bleeding, bruising or enlarged lymph nodes.  Physical Exam  BP (!) 140/78   Pulse (!) 50   Temp 97.9 F (36.6 C)   Resp 17   Ht '5\' 5"'$  (1.651 m)   Wt 147 lb 12.8 oz (67 kg)   SpO2 97%   BMI 24.60 kg/m   Appears  well nourished, well groomed  and in no distress.  Eyes: PERRLA, EOMs, conjunctiva no swelling or erythema. Sinuses: No frontal/maxillary tenderness ENT/Mouth: EAC's clear, TM's nl w/o erythema, bulging. Nares clear w/o erythema, swelling, exudates. Oropharynx clear without erythema or exudates. Oral hygiene is good. Tongue normal, non obstructing. Hearing intact.  Neck: Supple. Thyroid not palpable. Car 2+/2+ without bruits, nodes or JVD. Chest: Respirations nl with BS clear & equal w/o rales, rhonchi, wheezing or stridor.  Cor: Heart sounds normal w/ regular rate and rhythm without sig. murmurs, gallops, clicks or rubs. Peripheral pulses normal and equal  without edema.  Abdomen: Soft & bowel sounds normal. Non-tender w/o guarding, rebound, hernias, masses or organomegaly.  Lymphatics: Unremarkable.  Musculoskeletal: Full ROM all peripheral extremities, joint stability, 5/5 strength and normal gait.  Skin: Warm, dry without exposed rashes, lesions or ecchymosis apparent.  Neuro: Cranial nerves intact, reflexes equal bilaterally.  Sensory-motor testing grossly intact. Tendon reflexes grossly intact.  Pysch: Alert & oriented x 3.  Insight and judgement nl & appropriate. No ideations.  Assessment and Plan:  1. Essential hypertension   - Continue medication, monitor blood pressure at home.  - Continue DASH diet.  Reminder to go to the ER if any CP,  SOB, nausea, dizziness, severe HA, changes vision/speech.   - CBC with Differential/Platelet - COMPLETE METABOLIC PANEL WITH GFR - Magnesium - TSH  2. Hyperlipidemia, mixed   - Continue diet/meds, exercise,& lifestyle modifications.  - Continue monitor periodic cholesterol/liver & renal functions   - Lipid panel - TSH  3. Abnormal glucose   - Continue diet, exercise  - Lifestyle modifications.  - Monitor appropriate labs.   - Insulin, random - Hemoglobin A1c  4. Vitamin D deficiency   - Continue supplementation.   - VITAMIN D 25 Hydroxy    5. Gastroesophageal reflux disease without esophagitis  - D/C Famotidine & Rx Protonix 40 mg.   - Discussed anti-dyspeptic diet.  6. Medication management  - Magnesium - VITAMIN D 25 Hydroxy  - Insulin, random - Hemoglobin A1c - VITAMIN D 25 Hydroxy  - CBC with Differential/Platelet - COMPLETE METABOLIC PANEL WITH GFR - Magnesium - TSH - Lipid panel  7. Need for immunization against influenza  - Flu vaccine HIGH DOSE PF (Fluzone High dose)         Discussed  regular exercise, BP monitoring, weight control to achieve/maintain BMI less than 25 and discussed med and SE's. Recommended labs to assess and monitor clinical status with further disposition pending results of labs.  I discussed the assessment and treatment plan with the patient. The patient was provided an opportunity to ask questions and all were answered. The patient agreed with the plan and demonstrated an understanding of the instructions.  I provided over 30 minutes of exam, counseling, chart review and  complex critical decision  making.   Kirtland Bouchard, MD

## 2022-11-13 NOTE — Patient Instructions (Signed)

## 2022-11-14 ENCOUNTER — Ambulatory Visit (INDEPENDENT_AMBULATORY_CARE_PROVIDER_SITE_OTHER): Payer: Medicare HMO | Admitting: Internal Medicine

## 2022-11-14 ENCOUNTER — Encounter: Payer: Self-pay | Admitting: Internal Medicine

## 2022-11-14 VITALS — BP 140/78 | HR 50 | Temp 97.9°F | Resp 17 | Ht 65.0 in | Wt 147.8 lb

## 2022-11-14 DIAGNOSIS — R7309 Other abnormal glucose: Secondary | ICD-10-CM

## 2022-11-14 DIAGNOSIS — E559 Vitamin D deficiency, unspecified: Secondary | ICD-10-CM

## 2022-11-14 DIAGNOSIS — E782 Mixed hyperlipidemia: Secondary | ICD-10-CM

## 2022-11-14 DIAGNOSIS — Z79899 Other long term (current) drug therapy: Secondary | ICD-10-CM

## 2022-11-14 DIAGNOSIS — Z23 Encounter for immunization: Secondary | ICD-10-CM

## 2022-11-14 DIAGNOSIS — I10 Essential (primary) hypertension: Secondary | ICD-10-CM | POA: Diagnosis not present

## 2022-11-14 DIAGNOSIS — K219 Gastro-esophageal reflux disease without esophagitis: Secondary | ICD-10-CM

## 2022-11-14 MED ORDER — PANTOPRAZOLE SODIUM 40 MG PO TBEC: DELAYED_RELEASE_TABLET | ORAL | 3 refills | Status: DC

## 2022-11-15 LAB — LIPID PANEL
Cholesterol: 175 mg/dL (ref <200)
HDL: 77 mg/dL (ref >=50)
LDL Cholesterol (Calc): 84 mg/dL (calc)
Non-HDL Cholesterol (Calc): 98 mg/dL (calc) (ref <130)
Total CHOL/HDL Ratio: 2.3 (calc) (ref <5.0)
Triglycerides: 62 mg/dL (ref <150)

## 2022-11-15 LAB — CBC WITH DIFFERENTIAL/PLATELET
Absolute Monocytes: 229 cells/uL (ref 200–950)
Basophils Absolute: 49 cells/uL (ref 0–200)
Basophils Relative: 1.9 %
Eosinophils Absolute: 88 cells/uL (ref 15–500)
Eosinophils Relative: 3.4 %
HCT: 38.6 % (ref 35.0–45.0)
Hemoglobin: 12.5 g/dL (ref 11.7–15.5)
Lymphs Abs: 816 cells/uL — ABNORMAL LOW (ref 850–3900)
MCH: 26.5 pg — ABNORMAL LOW (ref 27.0–33.0)
MCHC: 32.4 g/dL (ref 32.0–36.0)
MCV: 81.8 fL (ref 80.0–100.0)
MPV: 11.5 fL (ref 7.5–12.5)
Monocytes Relative: 8.8 %
Neutro Abs: 1417 cells/uL — ABNORMAL LOW (ref 1500–7800)
Neutrophils Relative %: 54.5 %
Platelets: 238 10*3/uL (ref 140–400)
RBC: 4.72 10*6/uL (ref 3.80–5.10)
RDW: 13.9 % (ref 11.0–15.0)
Total Lymphocyte: 31.4 %
WBC: 2.6 10*3/uL — ABNORMAL LOW (ref 3.8–10.8)

## 2022-11-15 LAB — COMPLETE METABOLIC PANEL WITH GFR
AG Ratio: 1.7 (calc) (ref 1.0–2.5)
ALT: 8 U/L (ref 6–29)
AST: 14 U/L (ref 10–35)
Albumin: 4.2 g/dL (ref 3.6–5.1)
Alkaline phosphatase (APISO): 69 U/L (ref 37–153)
BUN: 10 mg/dL (ref 7–25)
CO2: 30 mmol/L (ref 20–32)
Calcium: 9.9 mg/dL (ref 8.6–10.4)
Chloride: 107 mmol/L (ref 98–110)
Creat: 0.75 mg/dL (ref 0.60–1.00)
Globulin: 2.5 g/dL (calc) (ref 1.9–3.7)
Glucose, Bld: 74 mg/dL (ref 65–99)
Potassium: 4.3 mmol/L (ref 3.5–5.3)
Sodium: 144 mmol/L (ref 135–146)
Total Bilirubin: 0.6 mg/dL (ref 0.2–1.2)
Total Protein: 6.7 g/dL (ref 6.1–8.1)
eGFR: 82 mL/min/{1.73_m2} (ref >=60)

## 2022-11-15 LAB — MAGNESIUM: Magnesium: 2 mg/dL (ref 1.5–2.5)

## 2022-11-15 LAB — HEMOGLOBIN A1C
Hgb A1c MFr Bld: 5.8 % of total Hgb — ABNORMAL HIGH (ref <5.7)
Mean Plasma Glucose: 120 mg/dL
eAG (mmol/L): 6.6 mmol/L

## 2022-11-15 LAB — TSH: TSH: 2.15 mIU/L (ref 0.40–4.50)

## 2022-11-15 LAB — INSULIN, RANDOM: Insulin: 14.3 u[IU]/mL

## 2022-11-15 LAB — VITAMIN D 25 HYDROXY (VIT D DEFICIENCY, FRACTURES): Vit D, 25-Hydroxy: 45 ng/mL (ref 30–100)

## 2022-11-15 NOTE — Progress Notes (Signed)
<><><><><><><><><><><><><><><><><><><><><><><><><><><><><><><><><> <><><><><><><><><><><><><><><><><><><><><><><><><><><><><><><><><>  - CBC is Stable  -   Hgb / Red cell Ct is Normal   - WBC  has been running on the low side of Normal for the last 5 years &   most likely means that you have not been challenged by a serious infection AND   we will continue to monitor,  since it's been stable for over 5 years   <><><><><><><><><><><><><><><><><><><><><><><><><><><><><><><><><>  -  A1c = 5.8% - Still  borderline  in the  early or pre-diabetes range which   has the same 300% increased risk for heart attack, stroke, cancer and   alzheimer- type vascular dementia as full blown diabetes.   But the good news is that diet, exercise with weight loss can                                                                         cure the early diabetes at this point. <><><><><><><><><><><><><><><><><><><><><><><><><><><><><><><><><>  -  So  . . . . Marland Kitchen   - Avoid Sweets, Candy & White Stuff   - White Rice, White St. Rose, White Flour  - Breads &  Pasta <><><><><><><><><><><><><><><><><><><><><><><><><><><><><><><><><>  -  Total Chol = 175  -  Excellent   - Very low risk for Heart Attack  / Stroke <><><><><><><><><><><><><><><><><><><><><><><><><><><><><><><><><>  -  Vitamin D = 45  - is Dangerously LOW    - Vitamin D goal is between 70-100.   - Please make sure that you are taking your Vitamin D 5,000   ( If you are taking Vit D5,000 already, then                                  please increase to 2 capsules = 10,000 units /day !  )   - It is very important as a natural anti-inflammatory and helping the  immune system protect against viral infections, like the Covid-19   - helping hair, skin, and nails, as well as reducing stroke and heart attack risk.   - It helps your bones and helps with mood.  - It also decreases numerous cancer risks so please                                                                            take it as directed.   - Low Vit D is associated with a 200-300% higher risk for  CANCER   and 200-300% higher risk for HEART   ATTACK  &  STROKE.    - It is also associated with higher death rate at younger ages,   autoimmune diseases like Rheumatoid arthritis, Lupus,  Multiple Sclerosis.     - Also many other serious conditions, like depression, Alzheimer's Dementia,  muscle aches, fatigue, fibromyalgia   <><><><><><><><><><><><><><><><><><><><><><><><><><><><><><><><><> <><><><><><><><><><><><><><><><><><><><><><><><><><><><><><><><><>  -  All Else - CBC - Kidneys - Electrolytes - Liver - Magnesium & Thyroid    - all  Normal / OK <><><><><><><><><><><><><><><><><><><><><><><><><><><><><><><><><> <><><><><><><><><><><><><><><><><><><><><><><><><><><><><><><><><>  -

## 2022-11-22 ENCOUNTER — Ambulatory Visit
Admission: RE | Admit: 2022-11-22 | Discharge: 2022-11-22 | Disposition: A | Discharge status: Home / Self Care | Payer: Medicare HMO | Source: Ambulatory Visit | Attending: Internal Medicine | Admitting: Internal Medicine

## 2022-11-22 ENCOUNTER — Other Ambulatory Visit (HOSPITAL_COMMUNITY)
Admission: RE | Admit: 2022-11-22 | Discharge: 2022-11-22 | Disposition: A | Discharge status: Home / Self Care | Payer: Medicare HMO | Source: Ambulatory Visit | Attending: Internal Medicine | Admitting: Internal Medicine

## 2022-11-22 DIAGNOSIS — E041 Nontoxic single thyroid nodule: Secondary | ICD-10-CM

## 2022-11-22 DIAGNOSIS — E0789 Other specified disorders of thyroid: Secondary | ICD-10-CM | POA: Diagnosis not present

## 2022-11-22 DIAGNOSIS — E042 Nontoxic multinodular goiter: Secondary | ICD-10-CM

## 2022-11-24 ENCOUNTER — Telehealth: Payer: Self-pay | Admitting: Internal Medicine

## 2022-11-24 LAB — CYTOLOGY - NON PAP

## 2022-11-24 NOTE — Telephone Encounter (Signed)
Patient notified

## 2022-11-24 NOTE — Telephone Encounter (Signed)
Please let the patient know that her thyroid biopsy results came back inconclusive, and further testing is needed.    They will send her results to an outside lab for further testing this may take another 2-3 weeks   We will keep her posted

## 2022-11-27 DIAGNOSIS — Z1231 Encounter for screening mammogram for malignant neoplasm of breast: Secondary | ICD-10-CM | POA: Diagnosis not present

## 2022-11-27 LAB — HM MAMMOGRAPHY

## 2022-11-30 DIAGNOSIS — E042 Nontoxic multinodular goiter: Secondary | ICD-10-CM | POA: Diagnosis not present

## 2022-12-12 ENCOUNTER — Encounter: Payer: Self-pay | Admitting: Internal Medicine

## 2022-12-12 ENCOUNTER — Encounter (HOSPITAL_COMMUNITY): Payer: Self-pay

## 2023-01-02 ENCOUNTER — Encounter: Payer: Self-pay | Admitting: Internal Medicine

## 2023-02-28 ENCOUNTER — Ambulatory Visit (INDEPENDENT_AMBULATORY_CARE_PROVIDER_SITE_OTHER): Payer: Medicare HMO | Admitting: Nurse Practitioner

## 2023-02-28 VITALS — BP 156/78 | HR 77 | Temp 98.1°F | Ht 65.0 in | Wt 155.2 lb

## 2023-02-28 DIAGNOSIS — D709 Neutropenia, unspecified: Secondary | ICD-10-CM | POA: Diagnosis not present

## 2023-02-28 DIAGNOSIS — I10 Essential (primary) hypertension: Secondary | ICD-10-CM | POA: Diagnosis not present

## 2023-02-28 DIAGNOSIS — K219 Gastro-esophageal reflux disease without esophagitis: Secondary | ICD-10-CM | POA: Diagnosis not present

## 2023-02-28 DIAGNOSIS — M810 Age-related osteoporosis without current pathological fracture: Secondary | ICD-10-CM | POA: Diagnosis not present

## 2023-02-28 DIAGNOSIS — R918 Other nonspecific abnormal finding of lung field: Secondary | ICD-10-CM

## 2023-02-28 DIAGNOSIS — F5105 Insomnia due to other mental disorder: Secondary | ICD-10-CM

## 2023-02-28 DIAGNOSIS — E782 Mixed hyperlipidemia: Secondary | ICD-10-CM | POA: Diagnosis not present

## 2023-02-28 DIAGNOSIS — E538 Deficiency of other specified B group vitamins: Secondary | ICD-10-CM | POA: Diagnosis not present

## 2023-02-28 DIAGNOSIS — Z0001 Encounter for general adult medical examination with abnormal findings: Secondary | ICD-10-CM

## 2023-02-28 DIAGNOSIS — R6889 Other general symptoms and signs: Secondary | ICD-10-CM

## 2023-02-28 DIAGNOSIS — Z79899 Other long term (current) drug therapy: Secondary | ICD-10-CM

## 2023-02-28 DIAGNOSIS — R002 Palpitations: Secondary | ICD-10-CM | POA: Diagnosis not present

## 2023-02-28 DIAGNOSIS — R7309 Other abnormal glucose: Secondary | ICD-10-CM | POA: Diagnosis not present

## 2023-02-28 DIAGNOSIS — E663 Overweight: Secondary | ICD-10-CM

## 2023-02-28 DIAGNOSIS — F418 Other specified anxiety disorders: Secondary | ICD-10-CM

## 2023-02-28 DIAGNOSIS — M170 Bilateral primary osteoarthritis of knee: Secondary | ICD-10-CM | POA: Diagnosis not present

## 2023-02-28 DIAGNOSIS — I700 Atherosclerosis of aorta: Secondary | ICD-10-CM

## 2023-02-28 DIAGNOSIS — E559 Vitamin D deficiency, unspecified: Secondary | ICD-10-CM | POA: Diagnosis not present

## 2023-02-28 DIAGNOSIS — R9389 Abnormal findings on diagnostic imaging of other specified body structures: Secondary | ICD-10-CM | POA: Diagnosis not present

## 2023-02-28 DIAGNOSIS — Z0000 Encounter for general adult medical examination without abnormal findings: Secondary | ICD-10-CM
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
## 2023-03-01 LAB — LIPID PANEL
Cholesterol: 196 mg/dL (ref <200)
HDL: 84 mg/dL (ref >=50)
LDL Cholesterol (Calc): 95 mg/dL (calc)
Non-HDL Cholesterol (Calc): 112 mg/dL (calc) (ref <130)
Total CHOL/HDL Ratio: 2.3 (calc) (ref <5.0)
Triglycerides: 78 mg/dL (ref <150)

## 2023-03-01 LAB — COMPLETE METABOLIC PANEL WITH GFR
AG Ratio: 1.8 (calc) (ref 1.0–2.5)
ALT: 16 U/L (ref 6–29)
AST: 22 U/L (ref 10–35)
Albumin: 4.5 g/dL (ref 3.6–5.1)
Alkaline phosphatase (APISO): 77 U/L (ref 37–153)
BUN: 12 mg/dL (ref 7–25)
CO2: 29 mmol/L (ref 20–32)
Calcium: 10.4 mg/dL (ref 8.6–10.4)
Chloride: 104 mmol/L (ref 98–110)
Creat: 0.63 mg/dL (ref 0.60–1.00)
Globulin: 2.5 g/dL (calc) (ref 1.9–3.7)
Glucose, Bld: 99 mg/dL (ref 65–99)
Potassium: 4.3 mmol/L (ref 3.5–5.3)
Sodium: 144 mmol/L (ref 135–146)
Total Bilirubin: 0.6 mg/dL (ref 0.2–1.2)
Total Protein: 7 g/dL (ref 6.1–8.1)
eGFR: 91 mL/min/{1.73_m2} (ref >=60)

## 2023-03-01 LAB — CBC WITH DIFFERENTIAL/PLATELET
Absolute Monocytes: 230 cells/uL (ref 200–950)
Basophils Absolute: 31 cells/uL (ref 0–200)
Basophils Relative: 1.1 %
Eosinophils Absolute: 109 cells/uL (ref 15–500)
Eosinophils Relative: 3.9 %
HCT: 38.9 % (ref 35.0–45.0)
Hemoglobin: 12.8 g/dL (ref 11.7–15.5)
Lymphs Abs: 896 cells/uL (ref 850–3900)
MCH: 26.5 pg — ABNORMAL LOW (ref 27.0–33.0)
MCHC: 32.9 g/dL (ref 32.0–36.0)
MCV: 80.5 fL (ref 80.0–100.0)
MPV: 11.5 fL (ref 7.5–12.5)
Monocytes Relative: 8.2 %
Neutro Abs: 1534 cells/uL (ref 1500–7800)
Neutrophils Relative %: 54.8 %
Platelets: 226 10*3/uL (ref 140–400)
RBC: 4.83 10*6/uL (ref 3.80–5.10)
RDW: 13.6 % (ref 11.0–15.0)
Total Lymphocyte: 32 %
WBC: 2.8 10*3/uL — ABNORMAL LOW (ref 3.8–10.8)

## 2023-03-01 LAB — HEMOGLOBIN A1C
Hgb A1c MFr Bld: 5.8 % of total Hgb — ABNORMAL HIGH (ref <5.7)
Mean Plasma Glucose: 120 mg/dL
eAG (mmol/L): 6.6 mmol/L

## 2023-03-01 LAB — TSH: TSH: 2.77 mIU/L (ref 0.40–4.50)

## 2023-03-01 LAB — MAGNESIUM: Magnesium: 2 mg/dL (ref 1.5–2.5)

## 2023-03-01 LAB — VITAMIN D 25 HYDROXY (VIT D DEFICIENCY, FRACTURES): Vit D, 25-Hydroxy: 76 ng/mL (ref 30–100)

## 2023-03-01 LAB — VITAMIN B12: Vitamin B-12: 572 pg/mL (ref 200–1100)

## 2023-03-05 NOTE — Patient Instructions (Signed)

## 2023-03-07 DIAGNOSIS — M8580 Other specified disorders of bone density and structure, unspecified site: Secondary | ICD-10-CM | POA: Diagnosis not present

## 2023-03-07 DIAGNOSIS — Z833 Family history of diabetes mellitus: Secondary | ICD-10-CM | POA: Diagnosis not present

## 2023-03-07 DIAGNOSIS — I10 Essential (primary) hypertension: Secondary | ICD-10-CM | POA: Diagnosis not present

## 2023-03-07 DIAGNOSIS — K219 Gastro-esophageal reflux disease without esophagitis: Secondary | ICD-10-CM | POA: Diagnosis not present

## 2023-03-07 DIAGNOSIS — K5900 Constipation, unspecified: Secondary | ICD-10-CM | POA: Diagnosis not present

## 2023-03-07 DIAGNOSIS — K08409 Partial loss of teeth, unspecified cause, unspecified class: Secondary | ICD-10-CM | POA: Diagnosis not present

## 2023-03-07 DIAGNOSIS — Z008 Encounter for other general examination: Secondary | ICD-10-CM | POA: Diagnosis not present

## 2023-03-07 DIAGNOSIS — E785 Hyperlipidemia, unspecified: Secondary | ICD-10-CM | POA: Diagnosis not present

## 2023-03-07 DIAGNOSIS — M1990 Unspecified osteoarthritis, unspecified site: Secondary | ICD-10-CM | POA: Diagnosis not present

## 2023-03-07 DIAGNOSIS — Z8249 Family history of ischemic heart disease and other diseases of the circulatory system: Secondary | ICD-10-CM | POA: Diagnosis not present

## 2023-03-07 DIAGNOSIS — I700 Atherosclerosis of aorta: Secondary | ICD-10-CM | POA: Diagnosis not present

## 2023-03-07 DIAGNOSIS — Z7982 Long term (current) use of aspirin: Secondary | ICD-10-CM | POA: Diagnosis not present

## 2023-03-23 DIAGNOSIS — J029 Acute pharyngitis, unspecified: Secondary | ICD-10-CM | POA: Diagnosis not present

## 2023-03-23 DIAGNOSIS — H6593 Unspecified nonsuppurative otitis media, bilateral: Secondary | ICD-10-CM | POA: Diagnosis not present

## 2023-03-23 DIAGNOSIS — Z03818 Encounter for observation for suspected exposure to other biological agents ruled out: Secondary | ICD-10-CM | POA: Diagnosis not present

## 2023-04-11 ENCOUNTER — Other Ambulatory Visit: Payer: Self-pay | Admitting: Nurse Practitioner

## 2023-04-11 DIAGNOSIS — I10 Essential (primary) hypertension: Secondary | ICD-10-CM

## 2023-04-11 MED ORDER — HYDROCHLOROTHIAZIDE 12.5 MG PO TABS: ORAL_TABLET | ORAL | 3 refills | Status: DC

## 2023-04-11 NOTE — Addendum Note (Signed)
Addended by: Dionicio Stall on: 04/11/2023 04:49 PM   Modules accepted: Orders

## 2023-04-14 ENCOUNTER — Other Ambulatory Visit: Payer: Self-pay | Admitting: Internal Medicine

## 2023-05-30 NOTE — Progress Notes (Unsigned)
COMPLETE PHYSICAL  Assessment:   Diagnoses and all orders for this visit:  Encounter for general medical examination with abnormal findings Due annually   Atherosclerosis of aorta (HCC) - per CT 6/3/202 Control blood pressure, cholesterol, glucose, increase exercise.   Varicose veins of both lower extremities, unspecified whether complicated - weight loss discussed, continue compression stockings and elevation  Essential hypertension Continue medication; Telmisartan 80 mg every day, HCTZ 12.5 mg daily Monitor blood pressure at home; call if consistently over 130/80 Continue DASH diet.   Reminder to go to the ER if any CP, SOB, nausea, dizziness, severe HA, changes vision/speech, left arm numbness and tingling and jaw pain. - CBC - CMP  Gastroesophageal reflux disease, esophagitis presence not specified Well managed on current medications Protonix, did not tolerate taper off Discussed diet, avoiding triggers and other lifestyle changes  Age-related osteoporosis without current pathological fracture - get DEXA 2 years, didn't tolerate fosamax, but R hip T improved to osteopenia last check, aggressive lifestyle recommendations,  Endocrinology is following due to parathyroid and fosamax intolerance continue Vit D , weight bearing exercises reviewed  Vitamin D deficiency At goal at recent check; continue to recommend supplementation for goal of 60-100 - vitamin D level  Abnormal glucose Discussed diet/exercise, weight management  A1C q41m  Mixed hyperlipidemia On rosuvastatin with LDL at goal  Continue low cholesterol diet and exercise.  Check lipid panel.   Medication management CBC, CMP/GFR, magnesium   Overweight (BMI 25.0-29.9) Long discussion about weight loss, diet, and exercise Recommended diet heavy in fruits and veggies and low in animal meats, cheeses, and dairy products, appropriate calorie intake Discussed appropriate weight for height and initial goal   Follow up at next visit  Knee arthritis Follow up Dr. Luiz Blare, has gotten injections in the past, not recently  Pulmonary nodules Follow up CT was benign no further imagining required  Parathryoid enlarged vs adenoma Continue to follow with endocrinology- Dr Lonzo Cloud  Palpitations Have improved, continue to monitor  Insomnia associated with depression/anxiety Stress management techniques discussed, increase water, good sleep hygiene discussed, increase exercise, and increase veggies.   Screening for ischemic heart disease -EKG  Screening for hematuria/proteinuria - Routine UA with reflex microscopic - Microalbumin/creatinine urine ratio  Screening for thyroid - TSH  Screening for AAA - U/S ABD Retroperitoneal LTD  Over 40 minutes of exam, counseling, chart review and critical decision making was performed Future Appointments  Date Time Provider Department Center  09/25/2023  1:30 PM [facility] DX DEXA 1 [facility]  10/22/2023 11:30 AM Shamleffer, Konrad Dolores, MD [facility] None  05/30/2024 10:00 AM Raynelle Dick, NP [facility] None  09/01/2024 11:00 AM Adela Glimpse, NP [facility] None    Advanced directives: requested   Subjective:  Emily Jenkins is a 77 y.o. female who presents for Medicare Annual Wellness Visit and 3 month follow up. She has Varicose veins of both lower extremities; Essential hypertension; Mixed hyperlipidemia; Abnormal glucose; Vitamin D deficiency; GERD ; Osteoporosis; Overweight (BMI 25.0-29.9); Bilateral primary osteoarthritis of knee; Abnormal ultrasound of parathyroid gland; Palpitations; Multinodular goiter; Aortic atherosclerosis (HCC) - CT chest 05/13/2021; Left sided sciatica; and Insomnia secondary to depression with anxiety on their problem list.   Recently had CT chest in 01/2021 showing several ill defined nodules, was stable in 05/13/2021 and felt most likely inflammatory but was recommended 12-18 month follow up. CT  05/25/22 showed  1. Multiple small ill-defined pulmonary nodules, either stable or smaller than on prior exams. These are likely  post infectious or inflammatory. There are no new or enlarging nodules. No further follow-up is needed. 2. Mild aortic atherosclerosis.  Coronary artery calcifications.  Also incidentally noted mass in thyroid area; US showed 1.7 cm right inferior enlarged parathyroid versus parathyroid adenoma. She was evaluated by endocrinology Dr. Lonzo Cloud who felt most likely thyroid nodule, planning to follow annually.  Denies hx of renal stones, does have osteoporosis -DEX12/2019 - R hip T improved from -2.8 to -2.4 on last DEXA 02/2021. Her calcium has been normal.  Lab Results  Component Value Date   PTH 45 10/31/2021   CALCIUM 10.4 02/28/2023   CAION 1.05 (L) 04/27/2008   She is followed by Dr. Ophelia Charter for bil knee arthritis,has received injections in the past but not recently, needs surgery but has been deferring. She does use Tylenol arthritis which does help  she has a diagnosis of GERD which is currently managed by Protonix 40 mg every day, tried to go off and use Famotidine but did not control symptoms. She does have some constipation and metamucil will control   BMI is Body mass index is 25.76 kg/m., she has been working on diet and exercise, walking up and down her driveway in the morning when cool, 15 min or so. Working in garden some.  Wt Readings from Last 3 Encounters:  05/31/23 154 lb 12.8 oz (70.2 kg)  02/28/23 155 lb 3.2 oz (70.4 kg)  11/14/22 147 lb 12.8 oz (67 kg)     Her blood pressure has been controlled at home (120/70s) on telmisartan 80 mg every day and hydrochlorothiazide 12.5 mg QD, today their BP is BP: 128/68  BP Readings from Last 3 Encounters:  05/31/23 128/68  02/28/23 (!) 156/78  11/14/22 (!) 140/78  She does workout. She denies chest pain, shortness of breath.Continues on HCTZ 12.5mg   She has aortic atherosclerosis per CT 05/13/2021.    She is on cholesterol medication (rosuvastatin 5 mg daily and tolerating well) and denies myalgias. Her cholesterol is at goal. The cholesterol last visit was:   Lab Results  Component Value Date   CHOL 196 02/28/2023   HDL 84 02/28/2023   LDLCALC 95 02/28/2023   TRIG 78 02/28/2023   CHOLHDL 2.3 02/28/2023    She has not been working on diet and exercise for glucose management, and denies increased appetite, nausea, paresthesia of the feet, polydipsia, polyuria and visual disturbances. Last A1C in the office was:  Lab Results  Component Value Date   HGBA1C 5.8 (H) 02/28/2023   Last GFR: Lab Results  Component Value Date   EGFR 91 02/28/2023    Patient is on Vitamin D supplement.   Lab Results  Component Value Date   VD25OH 76 02/28/2023       Medication Review: Current Outpatient Medications on File Prior to Visit  Medication Sig Dispense Refill   aspirin 81 MG tablet Take 81 mg by mouth every evening.      cholecalciferol (VITAMIN D) 1000 units tablet Take 5,000 Units by mouth daily.      hydrochlorothiazide (HYDRODIURIL) 12.5 MG tablet Take 1 tablet as needed in the morning for BP and Fluid Retention /Ankle Swelling 90 tablet 3   psyllium (METAMUCIL) 58.6 % packet Take 1 packet by mouth daily.     telmisartan (MICARDIS) 80 MG tablet Take 1 tablet by mouth once daily for blood pressure 90 tablet 0   vitamin C (ASCORBIC ACID) 500 MG tablet Take 500 mg by mouth daily.  No current facility-administered medications on file prior to visit.    Allergies  Allergen Reactions   Escitalopram Itching   Fosamax [Alendronate Sodium] Itching   Levofloxacin     Headache, chest pain, muscle aches   Sulfa Antibiotics Itching   Tramadol Itching    Current Problems (verified) Patient Active Problem List   Diagnosis Date Noted   Insomnia secondary to depression with anxiety 02/23/2022   Left sided sciatica 09/08/2021   Aortic atherosclerosis (HCC) - CT chest 05/13/2021  05/14/2021   Multinodular goiter 04/29/2021   Palpitations 03/10/2021   Abnormal ultrasound of parathyroid gland 01/24/2021   Bilateral primary osteoarthritis of knee 08/20/2019   Overweight (BMI 25.0-29.9) 10/29/2015   Osteoporosis 06/07/2015   GERD  03/04/2015   Essential hypertension 12/02/2013   Mixed hyperlipidemia 12/02/2013   Abnormal glucose 12/02/2013   Vitamin D deficiency 12/02/2013   Varicose veins of both lower extremities 09/16/2012    Screening Tests Immunization History  Administered Date(s) Administered   DTaP 12/11/2000   Influenza Split 10/24/2013   Influenza, High Dose Seasonal PF 09/17/2014, 09/09/2015, 08/21/2016, 09/21/2017, 10/14/2018, 10/13/2019, 09/23/2020, 09/08/2021, 11/14/2022   Moderna Sars-Covid-2 Vaccination 01/18/2020, 02/18/2020, 11/09/2020   Pneumococcal Conjugate-13 08/21/2016   Pneumococcal Polysaccharide-23 12/11/2002, 01/08/2018   Tdap 07/11/2012   Zoster, Live 08/06/2013   Health Maintenance  Topic Date Due   Zoster Vaccines- Shingrix (1 of 2) Never done   DTaP/Tdap/Td (3 - Td or Tdap) 07/11/2022   COVID-19 Vaccine (4 - 2023-24 season) 08/11/2022   DEXA SCAN  03/05/2023   INFLUENZA VACCINE  07/12/2023   MAMMOGRAM  11/28/2023   Medicare Annual Wellness (AWV)  02/28/2024   Pneumonia Vaccine 40+ Years old  Completed   Hepatitis C Screening  Completed   HPV VACCINES  Aged Out   Colonoscopy  Discontinued    -   Names of Other Physician/Practitioners you currently use: 1. [facility]- here for primary care 2. [facility] in [place], eye doctor,visit next month  3. Dr. Tenny Craw, dentist, last visit 2023, still going q73m without issues  Patient Care Team: Lucky Cowboy, MD as PCP - General (Internal Medicine) Delora Fuel, [place] (Optometry) Webb Silversmith, MD as Consulting Physician (Orthopedic Surgery) Charna Elizabeth, MD as Consulting Physician  (Gastroenterology) Kathleene Hazel, MD as Consulting Physician (Cardiology)  SURGICAL HISTORY She  has a past surgical history that includes Cholecystectomy; Tubal ligation; Ankle surgery; and left heart catheterization with coronary angiogram (N/A, 07/29/2014). FAMILY HISTORY Her family history includes Cancer in her father; Diabetes in her father; Heart disease (age of onset: 5) in her mother; Hyperlipidemia in her sister; Hypertension in her mother and sister. SOCIAL HISTORY She  reports that she has never smoked. She has never used smokeless tobacco. She reports that she does not drink alcohol and does not use drugs.      Review of Systems  Constitutional:  Negative for malaise/fatigue and weight loss.       Night sweats several times a week  HENT:  Negative for congestion, hearing loss, sore throat and tinnitus.   Eyes:  Negative for blurred vision and double vision.  Respiratory:  Negative for cough, sputum production, shortness of breath and wheezing.   Cardiovascular:  Negative for chest pain, palpitations, orthopnea, claudication, leg swelling and PND.  Gastrointestinal:  Negative for abdominal pain, blood in stool, constipation, diarrhea, heartburn, melena, nausea and vomiting.  Genitourinary: Negative.   Musculoskeletal:  Positive for back pain (intermittent,  sharp pain low back) and joint pain (R knee). Negative for falls and myalgias.  Skin:  Negative for rash.  Neurological:  Negative for dizziness, tingling, sensory change, weakness and headaches.  Endo/Heme/Allergies:  Positive for environmental allergies. Negative for polydipsia.  Psychiatric/Behavioral:  Negative for depression, memory loss, substance abuse and suicidal ideas. The patient is not nervous/anxious and does not have insomnia (intermittent).   All other systems reviewed and are negative.    Objective:     Today's Vitals   05/31/23 1001  BP: 128/68  Pulse: 85  Temp: 97.7 F (36.5 C)  SpO2:  98%  Weight: 154 lb 12.8 oz (70.2 kg)  Height: 5\' 5"  (1.651 m)   Body mass index is 25.76 kg/m.  General appearance: alert, no distress, WD/WN, female HEENT: normocephalic, sclerae anicteric, TMs pearly, nares patent, no discharge or erythema, pharynx normal Oral cavity: MMM, no lesions Neck: supple, no lymphadenopathy, no thyromegaly, no palpable masses Heart: RRR, normal S1, S2, no murmurs Lungs: CTA bilaterally, no wheezes, rhonchi, or rales Abdomen: +bs, soft, non tender, non distended, no masses, no hepatomegaly, no splenomegaly Musculoskeletal: nontender, no swelling, no obvious deformity. Decreased strength 4/5 of legs bilaterally Extremities: +1 nonpitting edema left ankle, varicose veins of legs bilaterally Pulses: 2+ symmetric, upper and lower extremities, normal cap refill Neurological: alert, oriented x 3, CN2-12 intact, strength normal upper extremities and lower extremities, sensation normal throughout, DTRs 2+ throughout, no cerebellar signs, gait normal Psychiatric: normal affect, behavior normal, pleasant  EKG: NSR, NO ST CHANGES AAA: < 3 cm  Raynelle Dick, NP   05/31/2023

## 2023-05-31 ENCOUNTER — Ambulatory Visit (INDEPENDENT_AMBULATORY_CARE_PROVIDER_SITE_OTHER): Payer: Medicare HMO | Admitting: Nurse Practitioner

## 2023-05-31 ENCOUNTER — Encounter: Payer: Self-pay | Admitting: Nurse Practitioner

## 2023-05-31 VITALS — BP 128/68 | HR 85 | Temp 97.7°F | Ht 65.0 in | Wt 154.8 lb

## 2023-05-31 DIAGNOSIS — R7309 Other abnormal glucose: Secondary | ICD-10-CM | POA: Diagnosis not present

## 2023-05-31 DIAGNOSIS — Z0000 Encounter for general adult medical examination without abnormal findings: Secondary | ICD-10-CM | POA: Diagnosis not present

## 2023-05-31 DIAGNOSIS — I8393 Asymptomatic varicose veins of bilateral lower extremities: Secondary | ICD-10-CM

## 2023-05-31 DIAGNOSIS — K219 Gastro-esophageal reflux disease without esophagitis: Secondary | ICD-10-CM | POA: Diagnosis not present

## 2023-05-31 DIAGNOSIS — I700 Atherosclerosis of aorta: Secondary | ICD-10-CM | POA: Diagnosis not present

## 2023-05-31 DIAGNOSIS — M810 Age-related osteoporosis without current pathological fracture: Secondary | ICD-10-CM

## 2023-05-31 DIAGNOSIS — M170 Bilateral primary osteoarthritis of knee: Secondary | ICD-10-CM

## 2023-05-31 DIAGNOSIS — R9389 Abnormal findings on diagnostic imaging of other specified body structures: Secondary | ICD-10-CM

## 2023-05-31 DIAGNOSIS — E782 Mixed hyperlipidemia: Secondary | ICD-10-CM | POA: Diagnosis not present

## 2023-05-31 DIAGNOSIS — I10 Essential (primary) hypertension: Secondary | ICD-10-CM

## 2023-05-31 DIAGNOSIS — Z1389 Encounter for screening for other disorder: Secondary | ICD-10-CM | POA: Diagnosis not present

## 2023-05-31 DIAGNOSIS — Z0001 Encounter for general adult medical examination with abnormal findings: Secondary | ICD-10-CM

## 2023-05-31 DIAGNOSIS — R002 Palpitations: Secondary | ICD-10-CM

## 2023-05-31 DIAGNOSIS — E559 Vitamin D deficiency, unspecified: Secondary | ICD-10-CM | POA: Diagnosis not present

## 2023-05-31 DIAGNOSIS — R918 Other nonspecific abnormal finding of lung field: Secondary | ICD-10-CM

## 2023-05-31 DIAGNOSIS — Z79899 Other long term (current) drug therapy: Secondary | ICD-10-CM

## 2023-05-31 DIAGNOSIS — Z1329 Encounter for screening for other suspected endocrine disorder: Secondary | ICD-10-CM | POA: Diagnosis not present

## 2023-05-31 DIAGNOSIS — Z136 Encounter for screening for cardiovascular disorders: Secondary | ICD-10-CM

## 2023-05-31 DIAGNOSIS — E663 Overweight: Secondary | ICD-10-CM

## 2023-05-31 DIAGNOSIS — F418 Other specified anxiety disorders: Secondary | ICD-10-CM

## 2023-05-31 MED ORDER — PANTOPRAZOLE SODIUM 40 MG PO TBEC: DELAYED_RELEASE_TABLET | ORAL | 3 refills | Status: DC

## 2023-05-31 MED ORDER — ROSUVASTATIN CALCIUM 5 MG PO TABS: ORAL_TABLET | ORAL | 3 refills | Status: DC

## 2023-05-31 NOTE — Patient Instructions (Signed)

## 2023-06-01 LAB — COMPLETE METABOLIC PANEL WITH GFR
AG Ratio: 1.8 (calc) (ref 1.0–2.5)
ALT: 14 U/L (ref 6–29)
AST: 15 U/L (ref 10–35)
Albumin: 4.3 g/dL (ref 3.6–5.1)
Alkaline phosphatase (APISO): 69 U/L (ref 37–153)
BUN: 9 mg/dL (ref 7–25)
CO2: 30 mmol/L (ref 20–32)
Calcium: 9.5 mg/dL (ref 8.6–10.4)
Chloride: 106 mmol/L (ref 98–110)
Creat: 0.65 mg/dL (ref 0.60–1.00)
Globulin: 2.4 g/dL (calc) (ref 1.9–3.7)
Glucose, Bld: 90 mg/dL (ref 65–99)
Potassium: 4.2 mmol/L (ref 3.5–5.3)
Sodium: 142 mmol/L (ref 135–146)
Total Bilirubin: 0.6 mg/dL (ref 0.2–1.2)
Total Protein: 6.7 g/dL (ref 6.1–8.1)
eGFR: 91 mL/min/{1.73_m2} (ref >=60)

## 2023-06-01 LAB — LIPID PANEL
Cholesterol: 181 mg/dL (ref <200)
HDL: 73 mg/dL (ref >=50)
LDL Cholesterol (Calc): 93 mg/dL (calc)
Non-HDL Cholesterol (Calc): 108 mg/dL (calc) (ref <130)
Total CHOL/HDL Ratio: 2.5 (calc) (ref <5.0)
Triglycerides: 68 mg/dL (ref <150)

## 2023-06-01 LAB — URINALYSIS, ROUTINE W REFLEX MICROSCOPIC
Bilirubin Urine: NEGATIVE
Glucose, UA: NEGATIVE
Hgb urine dipstick: NEGATIVE
Ketones, ur: NEGATIVE
Leukocytes,Ua: NEGATIVE
Nitrite: NEGATIVE
Protein, ur: NEGATIVE
Specific Gravity, Urine: 1.012 (ref 1.001–1.035)
pH: 5.5 (ref 5.0–8.0)

## 2023-06-01 LAB — VITAMIN D 25 HYDROXY (VIT D DEFICIENCY, FRACTURES): Vit D, 25-Hydroxy: 88 ng/mL (ref 30–100)

## 2023-06-01 LAB — CBC WITH DIFFERENTIAL/PLATELET
Absolute Monocytes: 197 cells/uL — ABNORMAL LOW (ref 200–950)
Basophils Absolute: 19 cells/uL (ref 0–200)
Basophils Relative: 0.7 %
Eosinophils Absolute: 78 cells/uL (ref 15–500)
Eosinophils Relative: 2.9 %
HCT: 37.3 % (ref 35.0–45.0)
Hemoglobin: 12.3 g/dL (ref 11.7–15.5)
Lymphs Abs: 880 cells/uL (ref 850–3900)
MCH: 26.8 pg — ABNORMAL LOW (ref 27.0–33.0)
MCHC: 33 g/dL (ref 32.0–36.0)
MCV: 81.3 fL (ref 80.0–100.0)
MPV: 11.6 fL (ref 7.5–12.5)
Monocytes Relative: 7.3 %
Neutro Abs: 1526 cells/uL (ref 1500–7800)
Neutrophils Relative %: 56.5 %
Platelets: 229 10*3/uL (ref 140–400)
RBC: 4.59 10*6/uL (ref 3.80–5.10)
RDW: 14.5 % (ref 11.0–15.0)
Total Lymphocyte: 32.6 %
WBC: 2.7 10*3/uL — ABNORMAL LOW (ref 3.8–10.8)

## 2023-06-01 LAB — MICROALBUMIN / CREATININE URINE RATIO
Creatinine, Urine: 85 mg/dL (ref 20–275)
Microalb Creat Ratio: 20 mg/g creat (ref <30)
Microalb, Ur: 1.7 mg/dL

## 2023-06-01 LAB — HEMOGLOBIN A1C
Hgb A1c MFr Bld: 5.8 % of total Hgb — ABNORMAL HIGH (ref <5.7)
Mean Plasma Glucose: 120 mg/dL
eAG (mmol/L): 6.6 mmol/L

## 2023-06-01 LAB — MAGNESIUM: Magnesium: 2.1 mg/dL (ref 1.5–2.5)

## 2023-06-01 LAB — TSH: TSH: 1.86 mIU/L (ref 0.40–4.50)

## 2023-06-15 ENCOUNTER — Other Ambulatory Visit: Payer: Self-pay | Admitting: Nurse Practitioner

## 2023-06-15 DIAGNOSIS — I10 Essential (primary) hypertension: Secondary | ICD-10-CM

## 2023-06-28 DIAGNOSIS — J029 Acute pharyngitis, unspecified: Secondary | ICD-10-CM | POA: Diagnosis not present

## 2023-06-28 DIAGNOSIS — U071 COVID-19: Secondary | ICD-10-CM | POA: Diagnosis not present

## 2023-06-28 DIAGNOSIS — R0981 Nasal congestion: Secondary | ICD-10-CM | POA: Diagnosis not present

## 2023-06-28 DIAGNOSIS — R059 Cough, unspecified: Secondary | ICD-10-CM | POA: Diagnosis not present

## 2023-09-03 NOTE — Progress Notes (Unsigned)
Assessment and Plan:  Hannan was seen today for follow-up.  Diagnoses and all orders for this visit:  Insomnia secondary to depression with anxiety Well controlled currently without medication Continue to monitor symptoms   Overweight (BMI 25.0-29.9) Long discussion about weight loss, diet, and exercise Recommended diet heavy in fruits and veggies and low in animal meats, cheeses, and dairy products, appropriate calorie intake Discussed appropriate weight for height  Follow up at next visit  Essential hypertension Continue Telmisartan 80 mg every day and hydrochlorothiazide 12.5 mg every day - continue DASH diet, exercise and monitor at home. Call if greater than 130/80.    Aortic atherosclerosis (HCC) Control BP, weight, cholesterol and blood sugars  Abnormal glucose Continue diet and exercise -     COMPLETE METABOLIC PANEL WITH GFR  Hyperlipidemia, mixed Continue Rosuvastatin, diet and exercise -     Lipid panel -     TSH -     rosuvastatin (CRESTOR) 5 MG tablet; Take  1 tablet  Daily for Cholesterol  Vitamin D deficiency Continue Vit D supplementation to maintain value in therapeutic level of 60-100   Medication management -     CBC with Differential/Platelet -     COMPLETE METABOLIC PANEL WITH GFR -     Lipid panel -     TSH -     Magnesium  Gastroesophageal reflux disease without esophagitis Symptoms are currently controlled with behavior modification -     Magnesium  Flu vaccine need -     Flu vaccine HIGH DOSE PF  Urine abnormality Notes very concentrated urine Advised to push fluids -     Urinalysis, Routine w reflex microscopic -     Urine Culture    Further disposition pending results of labs.  Discussed med's effects and SE's.   Over 30 minutes of exam, counseling, chart review, and critical decision making was performed.   Future Appointments  Date Time Provider Department Center  09/25/2023  1:30 PM [facility] DX DEXA 1 [facility]  10/22/2023 11:30 AM Shamleffer, Konrad Dolores, MD [facility] None  05/30/2024 10:00 AM Raynelle Dick, NP [facility] None  09/01/2024 11:00 AM Cranford, Archie Patten, NP [facility] None    ------------------------------------------------------------------------------------------------------------------   HPI BP 136/82   Pulse 83   Temp 97.7 F (36.5 C)   Ht 5\' 5"  (1.651 m)   Wt 150 lb (68 kg)   SpO2 98%   BMI 24.96 kg/m  77 y.o.female presents for 3 month follow up. has Varicose veins of both lower extremities; Essential hypertension; Mixed hyperlipidemia; Abnormal glucose; Vitamin D deficiency; GERD ; Osteoporosis; Overweight (BMI 25.0-29.9); Bilateral primary osteoarthritis of knee; Abnormal ultrasound of parathyroid gland; Palpitations; Multinodular goiter; Aortic atherosclerosis (HCC) - CT chest 05/13/2021; Left sided sciatica; and Insomnia secondary to depression with anxiety on their problem list.   Mood has been doing better. No longer taking medication  She has been noticing some lower back pain, describes as intermittent and Tylenol PM does help relieve.  She deals with constipation and uses metamucil and dulcolax stool softener.   Her gait is a little more unsteady and is using her cane.  She is concerned because she stays alone at night and is wondering about life alert.   Her blood pressure has been controlled at home (120s/70-80s), She is currently taking telmisartan 80 mg daily and HCTZ 12.5 mg daily. Today their BP is BP: 136/82  BP Readings from Last 3 Encounters:  09/04/23 136/82  05/31/23 128/68  02/28/23 Marland Kitchen)  156/78  She does workout. She denies chest pain, shortness of breath   She reports has been pushing water intake, minimum 3 bottles, aiming for 4, improved from previous, denies NSAID use.  Lab Results  Component Value Date   EGFR 91 05/31/2023   EGFR 91 02/28/2023   EGFR 82 11/14/2022   BMI is Body mass index is 24.96 kg/m., she has been working on  diet and exercise for insulin resistance/prediabetes.  Wt Readings from Last 3 Encounters:  09/04/23 150 lb (68 kg)  05/31/23 154 lb 12.8 oz (70.2 kg)  02/28/23 155 lb 3.2 oz (70.4 kg)   She is working on diet and exercise for abnormal glucose Lab Results  Component Value Date   HGBA1C 5.8 (H) 05/31/2023     Past Medical History:  Diagnosis Date   Depression 09/22/2017   09/21/2017 PHQ-9: 9   DJD (degenerative joint disease)    GERD (gastroesophageal reflux disease)    Hyperlipidemia    Hypertension    Pre-diabetes    RLS (restless legs syndrome)      Allergies  Allergen Reactions   Escitalopram Itching   Fosamax [Alendronate Sodium] Itching   Levofloxacin     Headache, chest pain, muscle aches   Sulfa Antibiotics Itching   Tramadol Itching    Current Outpatient Medications on File Prior to Visit  Medication Sig   aspirin 81 MG tablet Take 81 mg by mouth every evening.    cholecalciferol (VITAMIN D) 1000 units tablet Take 5,000 Units by mouth daily.    hydrochlorothiazide (HYDRODIURIL) 12.5 MG tablet Take 1 tablet as needed in the morning for BP and Fluid Retention /Ankle Swelling   psyllium (METAMUCIL) 58.6 % packet Take 1 packet by mouth daily.   rosuvastatin (CRESTOR) 5 MG tablet Take  1 tablet  Daily for Cholesterol                                                                                       /                                                           TAKE                                             BY                                                MOUTH  ONCE ?  DAILY   telmisartan (MICARDIS) 80 MG tablet Take 1 tablet by mouth once daily for blood pressure   vitamin C (ASCORBIC ACID) 500 MG tablet Take 500 mg by mouth daily.   pantoprazole (PROTONIX) 40 MG tablet Take 1 tablet Daily for Indigestion & Acid Reflux (Patient not taking: Reported on 09/04/2023)   No current facility-administered medications on  file prior to visit.    ROS: all negative except above.   Physical Exam:  BP 136/82   Pulse 83   Temp 97.7 F (36.5 C)   Ht 5\' 5"  (1.651 m)   Wt 150 lb (68 kg)   SpO2 98%   BMI 24.96 kg/m   General appearance: very pleasant female in no apparent distress HEENT: normocephalic, sclerae anicteric, TMs pearly, nares patent, no discharge or erythema, pharynx normal Oral cavity: MMM, no lesions Neck: supple, no lymphadenopathy, no thyromegaly, no palpable masses Heart: RRR, normal S1, S2, no murmurs Lungs: CTA bilaterally, no wheezes, rhonchi, or rales Abdomen: +bs, soft Musculoskeletal: nontender, no swelling, no obvious deformity.Unable to reproduce back pain Extremities: +1 nonpitting edema left ankle, varicose veins of legs bilaterally Pulses: 2+ symmetric, upper and lower extremities, normal cap refill Neurological:CN 2-12 intact, strength normal upper extremities and lower extremities Psychiatric: normal affect, behavior normal, pleasan    Raynelle Dick, NP 10:19 AM [facility]

## 2023-09-04 ENCOUNTER — Ambulatory Visit (INDEPENDENT_AMBULATORY_CARE_PROVIDER_SITE_OTHER): Payer: Medicare HMO | Admitting: Nurse Practitioner

## 2023-09-04 ENCOUNTER — Encounter: Payer: Self-pay | Admitting: Nurse Practitioner

## 2023-09-04 VITALS — BP 136/82 | HR 83 | Temp 97.7°F | Ht 65.0 in | Wt 150.0 lb

## 2023-09-04 DIAGNOSIS — M810 Age-related osteoporosis without current pathological fracture: Secondary | ICD-10-CM

## 2023-09-04 DIAGNOSIS — R7309 Other abnormal glucose: Secondary | ICD-10-CM

## 2023-09-04 DIAGNOSIS — Z79899 Other long term (current) drug therapy: Secondary | ICD-10-CM

## 2023-09-04 DIAGNOSIS — R8290 Unspecified abnormal findings in urine: Secondary | ICD-10-CM

## 2023-09-04 DIAGNOSIS — E559 Vitamin D deficiency, unspecified: Secondary | ICD-10-CM | POA: Diagnosis not present

## 2023-09-04 DIAGNOSIS — K219 Gastro-esophageal reflux disease without esophagitis: Secondary | ICD-10-CM

## 2023-09-04 DIAGNOSIS — Z23 Encounter for immunization: Secondary | ICD-10-CM

## 2023-09-04 DIAGNOSIS — I700 Atherosclerosis of aorta: Secondary | ICD-10-CM

## 2023-09-04 DIAGNOSIS — E663 Overweight: Secondary | ICD-10-CM | POA: Diagnosis not present

## 2023-09-04 DIAGNOSIS — I10 Essential (primary) hypertension: Secondary | ICD-10-CM | POA: Diagnosis not present

## 2023-09-04 DIAGNOSIS — F418 Other specified anxiety disorders: Secondary | ICD-10-CM

## 2023-09-04 DIAGNOSIS — F5105 Insomnia due to other mental disorder: Secondary | ICD-10-CM | POA: Diagnosis not present

## 2023-09-04 DIAGNOSIS — E782 Mixed hyperlipidemia: Secondary | ICD-10-CM | POA: Diagnosis not present

## 2023-09-04 MED ORDER — ROSUVASTATIN CALCIUM 5 MG PO TABS: ORAL_TABLET | ORAL | 3 refills | Status: DC

## 2023-09-04 NOTE — Patient Instructions (Signed)
Contact [employer] insurance regarding life line coverage  GENERAL HEALTH GOALS   Know what a healthy weight is for you (roughly BMI <25) and aim to maintain this   Aim for 7+ servings of fruits and vegetables daily   70-80+ fluid ounces of water or unsweet tea for healthy kidneys   Limit to max 1 drink of alcohol per day; avoid smoking/tobacco   Limit animal fats in diet for cholesterol and heart health - choose grass fed whenever available   Avoid highly processed foods, and foods high in saturated/trans fats   Aim for low stress - take time to unwind and care for your mental health   Aim for 150 min of moderate intensity exercise weekly for heart health, and weights twice weekly for bone health   Aim for 7-9 hours of sleep daily

## 2023-09-05 LAB — COMPLETE METABOLIC PANEL WITH GFR
AG Ratio: 1.7 (calc) (ref 1.0–2.5)
ALT: 8 U/L (ref 6–29)
AST: 16 U/L (ref 10–35)
Albumin: 4.3 g/dL (ref 3.6–5.1)
Alkaline phosphatase (APISO): 75 U/L (ref 37–153)
BUN: 9 mg/dL (ref 7–25)
CO2: 31 mmol/L (ref 20–32)
Calcium: 10 mg/dL (ref 8.6–10.4)
Chloride: 105 mmol/L (ref 98–110)
Creat: 0.6 mg/dL (ref 0.60–1.00)
Globulin: 2.6 g/dL (calc) (ref 1.9–3.7)
Glucose, Bld: 79 mg/dL (ref 65–99)
Potassium: 4 mmol/L (ref 3.5–5.3)
Sodium: 142 mmol/L (ref 135–146)
Total Bilirubin: 0.6 mg/dL (ref 0.2–1.2)
Total Protein: 6.9 g/dL (ref 6.1–8.1)
eGFR: 92 mL/min/{1.73_m2} (ref >=60)

## 2023-09-05 LAB — CBC WITH DIFFERENTIAL/PLATELET
Absolute Monocytes: 237 cells/uL (ref 200–950)
Basophils Absolute: 48 cells/uL (ref 0–200)
Basophils Relative: 1.5 %
Eosinophils Absolute: 99 cells/uL (ref 15–500)
Eosinophils Relative: 3.1 %
HCT: 41.2 % (ref 35.0–45.0)
Hemoglobin: 13 g/dL (ref 11.7–15.5)
Lymphs Abs: 938 cells/uL (ref 850–3900)
MCH: 26.3 pg — ABNORMAL LOW (ref 27.0–33.0)
MCHC: 31.6 g/dL — ABNORMAL LOW (ref 32.0–36.0)
MCV: 83.2 fL (ref 80.0–100.0)
MPV: 12.1 fL (ref 7.5–12.5)
Monocytes Relative: 7.4 %
Neutro Abs: 1878 cells/uL (ref 1500–7800)
Neutrophils Relative %: 58.7 %
Platelets: 244 10*3/uL (ref 140–400)
RBC: 4.95 10*6/uL (ref 3.80–5.10)
RDW: 13.9 % (ref 11.0–15.0)
Total Lymphocyte: 29.3 %
WBC: 3.2 10*3/uL — ABNORMAL LOW (ref 3.8–10.8)

## 2023-09-05 LAB — MAGNESIUM: Magnesium: 2.2 mg/dL (ref 1.5–2.5)

## 2023-09-05 LAB — LIPID PANEL
Cholesterol: 172 mg/dL (ref <200)
HDL: 73 mg/dL (ref >=50)
LDL Cholesterol (Calc): 85 mg/dL (calc)
Non-HDL Cholesterol (Calc): 99 mg/dL (calc) (ref <130)
Total CHOL/HDL Ratio: 2.4 (calc) (ref <5.0)
Triglycerides: 59 mg/dL (ref <150)

## 2023-09-05 LAB — URINE CULTURE
MICRO NUMBER:: 15510697
SPECIMEN QUALITY:: ADEQUATE

## 2023-09-05 LAB — URINALYSIS, ROUTINE W REFLEX MICROSCOPIC
Bilirubin Urine: NEGATIVE
Glucose, UA: NEGATIVE
Hgb urine dipstick: NEGATIVE
Ketones, ur: NEGATIVE
Leukocytes,Ua: NEGATIVE
Nitrite: NEGATIVE
Protein, ur: NEGATIVE
Specific Gravity, Urine: 1.004 (ref 1.001–1.035)
pH: 6.5 (ref 5.0–8.0)

## 2023-09-05 LAB — TSH: TSH: 1.82 mIU/L (ref 0.40–4.50)

## 2023-09-05 NOTE — Progress Notes (Signed)
Patient is aware of lab results. -e welch

## 2023-09-06 NOTE — Progress Notes (Signed)
Patient is aware of all lab results. -e welch

## 2023-09-25 ENCOUNTER — Inpatient Hospital Stay: Admission: RE | Admit: 2023-09-25 | Payer: Medicare HMO | Source: Ambulatory Visit

## 2023-10-22 ENCOUNTER — Encounter: Payer: Self-pay | Admitting: Internal Medicine

## 2023-10-22 ENCOUNTER — Ambulatory Visit (INDEPENDENT_AMBULATORY_CARE_PROVIDER_SITE_OTHER): Payer: Medicare HMO | Admitting: Internal Medicine

## 2023-10-22 VITALS — BP 120/70 | HR 94 | Ht 65.0 in | Wt 149.2 lb

## 2023-10-22 DIAGNOSIS — E042 Nontoxic multinodular goiter: Secondary | ICD-10-CM | POA: Diagnosis not present

## 2023-10-22 NOTE — Progress Notes (Unsigned)
Name: Emily Jenkins  MRN/ DOB: [id number], 08-15-1946    Age/ Sex: 77 y.o., female     PCP: Lucky Cowboy, MD   Reason for Endocrinology Evaluation: MNG     Initial Endocrinology Clinic Visit: 04/29/21    PATIENT IDENTIFIER: Emily Jenkins is a 77 y.o., female with a past medical history of .HTN, SVT, Osteoporosis and mixed hyperlipidemia. She has followed with [facility] since 04/29/2021 for consultative assistance with management of her MNG.   HISTORICAL SUMMARY:  She was noted to have a right inferior thyroid nodule on CT scan of the chest in 01/2021. This promoted a thyroid ultrasound demonstrating a questionable right parathyroid adenoma vs thyroid nodule. She also has sub-centimeter thyroid nodules    Of note the pt has normal calcium and parathyroid hormone level.    No FH of thyroid disease   She is/P FNA of the right inferior 2.2 cm nodule 11/2022 with atypia of undetermined significance, Afirma was benign  SUBJECTIVE:     Today (10/22/2023):  Ms. [name] is here for a follow up on Multinodular goiter.  Denies local neck swelling  Has noted recent sore throat, no fever  Denies palpitations  Has occasional tremors  Has chronic constipation, takes laxatives    HISTORY:  Past Medical History:  Past Medical History:  Diagnosis Date   Depression 09/22/2017   09/21/2017 PHQ-9: 9   DJD (degenerative joint disease)    GERD (gastroesophageal reflux disease)    Hyperlipidemia    Hypertension    Pre-diabetes    RLS (restless legs syndrome)    Past Surgical History:  Past Surgical History:  Procedure Laterality Date   ANKLE SURGERY     CHOLECYSTECTOMY     LEFT HEART CATHETERIZATION WITH CORONARY ANGIOGRAM N/A 07/29/2014   Procedure: LEFT HEART CATHETERIZATION WITH CORONARY ANGIOGRAM;  Surgeon: Kathleene Hazel, MD;  Location: [facility] CATH LAB;  Service: Cardiovascular;  Laterality: N/A;   TUBAL LIGATION     Social History:   reports that she has never smoked. She has never used smokeless tobacco. She reports that she does not drink alcohol and does not use drugs. Family History:  Family History  Problem Relation Age of Onset   Heart disease Mother 21       stent   Hypertension Mother    Cancer Father    Diabetes Father    Hyperlipidemia Sister    Hypertension Sister      HOME MEDICATIONS: Allergies as of 10/22/2023       Reactions   Escitalopram Itching   Fosamax [alendronate Sodium] Itching   Levofloxacin    Headache, chest pain, muscle aches   Sulfa Antibiotics Itching   Tramadol Itching        Medication List        Accurate as of October 22, 2023 11:24 AM. If you have any questions, ask your nurse or doctor.          ascorbic acid 500 MG tablet Commonly known as: VITAMIN C Take 500 mg by mouth daily.   aspirin 81 MG tablet Take 81 mg by mouth every evening.   cholecalciferol 1000 units tablet Commonly known as: VITAMIN D Take 5,000 Units by mouth daily.   hydrochlorothiazide 12.5 MG tablet Commonly known as: HYDRODIURIL Take 1 tablet as needed in the morning for BP and Fluid Retention /Ankle Swelling   pantoprazole 40 MG tablet Commonly known as: Protonix Take 1 tablet Daily for Indigestion & Acid Reflux  psyllium 58.6 % packet Commonly known as: METAMUCIL Take 1 packet by mouth daily.   rosuvastatin 5 MG tablet Commonly known as: CRESTOR Take  1 tablet  Daily for Cholesterol   telmisartan 80 MG tablet Commonly known as: MICARDIS Take 1 tablet by mouth once daily for blood pressure          OBJECTIVE:   PHYSICAL EXAM: VS: BP 120/70 (BP Location: Left Arm, Patient Position: Sitting, Cuff Size: Small)   Pulse 94   Ht 5\' 5"  (1.651 m)   Wt 149 lb 3.2 oz (67.7 kg)   SpO2 99%   BMI 24.83 kg/m    EXAM: General: Pt appears well and is in NAD  Neck: General: Supple without adenopathy. Thyroid: Thyroid size normal.  No goiter or nodules appreciated.   Lungs: Clear with good BS bilat with no rales, rhonchi, or wheezes  Heart: Auscultation: RRR.  Extremities: No pretibial edema normal   Mental Status: Judgment, insight: Intact Orientation: Oriented to time, place, and person Mood and affect: No depression, anxiety, or agitation     DATA REVIEWED:  Latest Reference Range & Units 09/04/23 10:40  TSH 0.40 - 4.50 mIU/L 1.82    Thyroid Ultrasound 11/08/2022 Estimated total number of nodules >/= 1 cm: 1   Number of spongiform nodules >/=  2 cm not described below (TR1): 0   Number of mixed cystic and solid nodules >/= 1.5 cm not described below (TR2): 0   _________________________________________________________   The approximately 0.5 cm iso/hypoechoic nodule within the left side of the thyroid isthmus (labeled 1) is unchanged compared to the 01/2021 examination and again does not meet criteria to recommend percutaneous sampling or continued dedicated follow-up.   The approximately 0.8 cm hypoechoic nodule in the thyroid isthmus (labeled 2), is unchanged compared to the 01/2021 examination, and again does not meet criteria to recommend percutaneous sampling or continued dedicated follow-up.   Punctate (approximately 0.6 cm) hypoechoic nodule within the mid aspect of the left lobe of the thyroid (labeled 3) has slightly increased in size compared to the 01/2021 examination, previously 0.4 cm however again does not meet criteria to recommend percutaneous sampling or continued dedicated follow-up.   _________________________________________________________   Nodule # 4:   Prior biopsy: No   Location: Right; Inferior   Maximum size: 2.2 cm; Other 2 dimensions: 1.5 x 1.4 cm, previously, 1.7 x 1.5 x 0.9 cm   Composition: solid/almost completely solid (2)   Echogenicity: hypoechoic (2)   Shape: not taller-than-wide (0)   Margins: extra-thyroidal extension (3)   Echogenic foci: none (0)   ACR TI-RADS total points:  7.   ACR TI-RADS risk category:  TR5 (>/= 7 points).   Significant change in size (>/= 20% in two dimensions and minimal increase of 2 mm): Yes   Change in features: No   Change in ACR TI-RADS risk category: No   ACR TI-RADS recommendations:   **Given size (>/= 1.0 cm) and appearance, fine needle aspiration of this highly suspicious nodule should be considered based on TI-RADS criteria.   _________________________________________________________   The approximately 0.6 cm isoechoic ill-defined nodule/pseudonodule within the mid aspect of the left lobe of the thyroid (labeled 5) is unchanged compared to the 01/2021 examination and again does not meet imaging criteria to recommend percutaneous sampling or continued dedicated follow-up.   IMPRESSION: 1. Similar findings of multinodular goiter. 2. Interval increase in size of dominant exophytic right-sided thyroid nodule versus (favored) parathyroid adenoma. Correlation for symptoms of hyperparathyroidism  is advised. Further evaluation with nuclear medicine parathyroid scintigraphy could be performed as indicated. Otherwise, this nodule meets criteria to recommend percutaneous sampling as indicated. 3. None of the additional discretely measured thyroid nodules/cysts meet imaging criteria to recommend percutaneous sampling or continued dedicated follow-up.  FNA right inferior nodule 11/22/2022 \  Clinical History: Nodule #4: Right; Inferior, Maximum size: 2.2 cm;  other 2 dimensions: 1.5 x 1.4, previously, 1.7 x 1.5 x 0.9 cm,  solid/almost completely solid, hypoechoic, Margins: extra-thyroidal  extension, TI-RADS total points: 7.  Specimen Submitted:  A. THYROID, RT INFERIOR, FINE NEEDLE ASPIRATION:    FINAL MICROSCOPIC DIAGNOSIS:  - Follicular lesion of undetermined significance (Bethesda category III)    Afirma - benign    ASSESSMENT / PLAN / RECOMMENDATIONS:   MNG:     -Patient is clinically and biochemically  euthyroid -She has no local neck symptoms -Thyroid ultrasound shows multinodular goiter but none meets criteria for FNA at this time - Will repeat thyroid ultrasound this year         F/U in 1 yr   Signed electronically by: Lyndle Herrlich, MD  [facility]  [facility] [address] [place], [place] [address] Phone: [phone] FAX: [phone]      CC: Lucky Cowboy, MD [address] [place] [address] Phone: [phone]  Fax: [phone]   Return to Endocrinology clinic as below: Future Appointments  Date Time Provider Department Center  10/22/2023 11:30 AM Castulo Scarpelli, Konrad Dolores, MD [facility] None  05/30/2024 10:00 AM Raynelle Dick, NP [facility] None  09/01/2024 11:00 AM Adela Glimpse, NP [facility] None

## 2023-10-24 ENCOUNTER — Inpatient Hospital Stay
Admission: RE | Admit: 2023-10-24 | Discharge: 2023-10-24 | Discharge status: Home / Self Care | Payer: Medicare HMO | Source: Ambulatory Visit | Attending: Internal Medicine | Admitting: Internal Medicine

## 2023-10-24 DIAGNOSIS — E041 Nontoxic single thyroid nodule: Secondary | ICD-10-CM | POA: Diagnosis not present

## 2023-10-24 DIAGNOSIS — E042 Nontoxic multinodular goiter: Secondary | ICD-10-CM

## 2023-11-01 ENCOUNTER — Encounter: Payer: Self-pay | Admitting: Internal Medicine

## 2023-11-01 ENCOUNTER — Other Ambulatory Visit: Payer: Self-pay | Admitting: Nurse Practitioner

## 2023-11-01 DIAGNOSIS — I10 Essential (primary) hypertension: Secondary | ICD-10-CM

## 2023-11-26 DIAGNOSIS — H25813 Combined forms of age-related cataract, bilateral: Secondary | ICD-10-CM | POA: Diagnosis not present

## 2023-11-30 DIAGNOSIS — M810 Age-related osteoporosis without current pathological fracture: Secondary | ICD-10-CM | POA: Diagnosis not present

## 2023-11-30 DIAGNOSIS — Z1231 Encounter for screening mammogram for malignant neoplasm of breast: Secondary | ICD-10-CM | POA: Diagnosis not present

## 2023-11-30 DIAGNOSIS — M8588 Other specified disorders of bone density and structure, other site: Secondary | ICD-10-CM | POA: Diagnosis not present

## 2023-11-30 LAB — HM MAMMOGRAPHY

## 2023-12-03 ENCOUNTER — Encounter: Payer: Self-pay | Admitting: Internal Medicine

## 2024-02-28 DIAGNOSIS — F419 Anxiety disorder, unspecified: Secondary | ICD-10-CM | POA: Diagnosis not present

## 2024-02-28 DIAGNOSIS — R141 Gas pain: Secondary | ICD-10-CM | POA: Diagnosis not present

## 2024-02-28 DIAGNOSIS — K5904 Chronic idiopathic constipation: Secondary | ICD-10-CM | POA: Diagnosis not present

## 2024-02-28 DIAGNOSIS — E782 Mixed hyperlipidemia: Secondary | ICD-10-CM | POA: Diagnosis not present

## 2024-02-28 DIAGNOSIS — I10 Essential (primary) hypertension: Secondary | ICD-10-CM | POA: Diagnosis not present

## 2024-03-18 ENCOUNTER — Encounter: Payer: Self-pay | Admitting: Physician Assistant

## 2024-03-18 ENCOUNTER — Ambulatory Visit (INDEPENDENT_AMBULATORY_CARE_PROVIDER_SITE_OTHER): Admitting: Physician Assistant

## 2024-03-18 VITALS — BP 174/89 | HR 83 | Temp 98.2°F | Ht 65.0 in | Wt 153.0 lb

## 2024-03-18 DIAGNOSIS — I10 Essential (primary) hypertension: Secondary | ICD-10-CM | POA: Diagnosis not present

## 2024-03-18 DIAGNOSIS — E782 Mixed hyperlipidemia: Secondary | ICD-10-CM

## 2024-03-18 DIAGNOSIS — K5909 Other constipation: Secondary | ICD-10-CM | POA: Diagnosis not present

## 2024-03-18 DIAGNOSIS — R7303 Prediabetes: Secondary | ICD-10-CM | POA: Insufficient documentation

## 2024-03-18 DIAGNOSIS — Z7689 Persons encountering health services in other specified circumstances: Secondary | ICD-10-CM

## 2024-03-18 DIAGNOSIS — K219 Gastro-esophageal reflux disease without esophagitis: Secondary | ICD-10-CM

## 2024-03-18 MED ORDER — ROSUVASTATIN CALCIUM 5 MG PO TABS: ORAL_TABLET | ORAL | 3 refills | Status: AC

## 2024-03-18 MED ORDER — PANTOPRAZOLE SODIUM 40 MG PO TBEC: DELAYED_RELEASE_TABLET | ORAL | 3 refills | Status: AC

## 2024-03-18 MED ORDER — POLYETHYLENE GLYCOL 3350 17 G PO PACK: 17.0000 g | PACK | Freq: Every day | ORAL | 1 refills | Status: DC

## 2024-03-18 MED ORDER — HYDROCHLOROTHIAZIDE 12.5 MG PO TABS: ORAL_TABLET | ORAL | 3 refills | Status: DC

## 2024-03-18 MED ORDER — TELMISARTAN 80 MG PO TABS: 80.0000 mg | ORAL_TABLET | Freq: Every day | ORAL | 0 refills | Status: DC

## 2024-03-18 NOTE — Assessment & Plan Note (Signed)
 Stable on current medication. Pantoprazole refilled today.

## 2024-03-18 NOTE — Progress Notes (Signed)
 New Patient Office Visit  Subjective    Patient ID: Emily Jenkins, female    DOB: Feb 05, 1946  Age: 78 y.o. MRN: [id number]  CC:  Chief Complaint  Patient presents with   New Patient (Initial Visit)    HPI IVOREE FELMLEE presents to establish care  Patient presents today with past medical history significant for hypertension, hyperlipidemia, prediabetes, vitamin D deficiency, multinodular goiter followed by endocrinology, and chronic constipation followed by GI. She reports daily compliance with medications but requests refills today. She states she monitors blood pressure readings daily at home, with readings no higher than 140s/80s. She reports an upcoming eye surgery next week. Otherwise she has no concerns or complaints today.   Outpatient Encounter Medications as of 03/18/2024  Medication Sig   aspirin 81 MG tablet Take 81 mg by mouth every evening.    cholecalciferol (VITAMIN D) 1000 units tablet Take 5,000 Units by mouth daily.    docusate sodium (COLACE) 100 MG capsule Take 100 mg by mouth 2 (two) times daily.   vitamin C (ASCORBIC ACID) 500 MG tablet Take 500 mg by mouth daily.   [DISCONTINUED] hydrochlorothiazide (HYDRODIURIL) 12.5 MG tablet Take 1 tablet as needed in the morning for BP and Fluid Retention /Ankle Swelling   [DISCONTINUED] pantoprazole (PROTONIX) 40 MG tablet Take 1 tablet Daily for Indigestion & Acid Reflux   [DISCONTINUED] polyethylene glycol (MIRALAX / GLYCOLAX) 17 g packet Take 17 g by mouth daily.   [DISCONTINUED] rosuvastatin (CRESTOR) 5 MG tablet Take  1 tablet  Daily for Cholesterol   [DISCONTINUED] telmisartan (MICARDIS) 80 MG tablet Take 1 tablet by mouth once daily for blood pressure   hydrochlorothiazide (HYDRODIURIL) 12.5 MG tablet Take 1 tablet as needed in the morning for BP and Fluid Retention /Ankle Swelling   pantoprazole (PROTONIX) 40 MG tablet Take 1 tablet Daily for Indigestion & Acid Reflux   polyethylene glycol (MIRALAX /  GLYCOLAX) 17 g packet Take 17 g by mouth daily.   rosuvastatin (CRESTOR) 5 MG tablet Take  1 tablet  Daily for Cholesterol   telmisartan (MICARDIS) 80 MG tablet Take 1 tablet (80 mg total) by mouth daily. for blood pressure   [DISCONTINUED] psyllium (METAMUCIL) 58.6 % packet Take 1 packet by mouth daily.   No facility-administered encounter medications on file as of 03/18/2024.    Past Medical History:  Diagnosis Date   Depression 09/22/2017   09/21/2017 PHQ-9: 9   DJD (degenerative joint disease)    GERD (gastroesophageal reflux disease)    Hyperlipidemia    Hypertension    Pre-diabetes    RLS (restless legs syndrome)     Past Surgical History:  Procedure Laterality Date   ANKLE SURGERY     CHOLECYSTECTOMY     LEFT HEART CATHETERIZATION WITH CORONARY ANGIOGRAM N/A 07/29/2014   Procedure: LEFT HEART CATHETERIZATION WITH CORONARY ANGIOGRAM;  Surgeon: Kathleene Hazel, MD;  Location: [facility] CATH LAB;  Service: Cardiovascular;  Laterality: N/A;   TUBAL LIGATION      Family History  Problem Relation Age of Onset   Heart disease Mother 22       stent   Hypertension Mother    Cancer Father    Diabetes Father    Hyperlipidemia Sister    Hypertension Sister     Social History   Socioeconomic History   Marital status: Widowed    Spouse name: Not on file   Number of children: 3   Years of education: Not on file  Highest education level: Not on file  Occupational History   Not on file  Tobacco Use   Smoking status: Never   Smokeless tobacco: Never   Tobacco comments:    SPOUSE SMOKES  Vaping Use   Vaping status: Never Used  Substance and Sexual Activity   Alcohol use: No    Alcohol/week: 0.0 standard drinks of alcohol   Drug use: No   Sexual activity: Yes  Other Topics Concern   Not on file  Social History Narrative   Lives with second husband.    Social Drivers of Corporate investment banker Strain: Not on file  Food Insecurity: Not on file  Transportation  Needs: Not on file  Physical Activity: Not on file  Stress: Not on file  Social Connections: Not on file  Intimate Partner Violence: Not on file    Review of Systems  Constitutional:  Negative for chills, fever, malaise/fatigue and weight loss.  Respiratory:  Negative for shortness of breath.   Cardiovascular:  Negative for chest pain and palpitations.  Gastrointestinal:  Positive for constipation. Negative for diarrhea, nausea and vomiting.  Genitourinary:  Negative for dysuria.  Musculoskeletal:  Negative for falls and myalgias.        Objective    BP (!) 174/89   Pulse 83   Temp 98.2 F (36.8 C)   Ht 5\' 5"  (1.651 m)   Wt 153 lb (69.4 kg)   SpO2 100%   BMI 25.46 kg/m   Physical Exam Constitutional:      Appearance: Normal appearance.  HENT:     Head: Normocephalic.     Mouth/Throat:     Mouth: Mucous membranes are moist.     Pharynx: Oropharynx is clear.  Eyes:     Extraocular Movements: Extraocular movements intact.     Conjunctiva/sclera: Conjunctivae normal.  Cardiovascular:     Rate and Rhythm: Normal rate and regular rhythm.     Heart sounds: Normal heart sounds. No murmur heard. Pulmonary:     Effort: Pulmonary effort is normal.     Breath sounds: Normal breath sounds.  Musculoskeletal:     Right lower leg: No edema.     Left lower leg: No edema.  Skin:    General: Skin is warm and dry.  Neurological:     General: No focal deficit present.     Mental Status: She is alert and oriented to person, place, and time.  Psychiatric:        Mood and Affect: Mood normal.        Behavior: Behavior normal.       Assessment & Plan:  Encounter to establish care  Essential hypertension Assessment & Plan: 161/71 Reports normal blood pressure readings daily at home. Stable. Continue current medications. No change in management. Will follow BP over the next few visits and increase medication as indicated.  Discussed DASH diet and dietary sodium  restrictions.  Increase dietary efforts and physical activity. Lab work today.   Orders: -     hydroCHLOROthiazide; Take 1 tablet as needed in the morning for BP and Fluid Retention /Ankle Swelling  Dispense: 90 tablet; Refill: 3 -     Telmisartan; Take 1 tablet (80 mg total) by mouth daily. for blood pressure  Dispense: 90 tablet; Refill: 0 -     CBC with Differential/Platelet -     Comprehensive metabolic panel with GFR  Hyperlipidemia, mixed Assessment & Plan: Stable. Continue with current management without changes. Discussed healthy diet and  lifestyle. Medication refilled today.  Lab work today.   Orders: -     Rosuvastatin Calcium; Take  1 tablet  Daily for Cholesterol  Dispense: 90 tablet; Refill: 3 -     Lipid panel  Gastroesophageal reflux disease without esophagitis Assessment & Plan: Stable on current medication. Pantoprazole refilled today.   Orders: -     Pantoprazole Sodium; Take 1 tablet Daily for Indigestion & Acid Reflux  Dispense: 90 tablet; Refill: 3  Chronic constipation Assessment & Plan: Follows with GI. Miralax and colace daily. Recommend Miralax clean out, 1 capful every 6 hours, if she has not had a BM by tomorrow. Advised follow up with GI. Advised increase fiber and fluid intake.   Orders: -     Polyethylene Glycol 3350; Take 17 g by mouth daily.  Dispense: 90 packet; Refill: 1  Prediabetes Assessment & Plan: Stable. A1c of 5.8 on year ago. Lab work today. Discussed healthy diet and lifestyle.    Orders: -     Hemoglobin A1c    Return in about 6 months (around 09/17/2024).   Toni Amend Shamon Lobo, PA-C

## 2024-03-18 NOTE — Assessment & Plan Note (Signed)
 Stable. A1c of 5.8 on year ago. Lab work today. Discussed healthy diet and lifestyle.

## 2024-03-18 NOTE — Assessment & Plan Note (Addendum)
 161/71 Reports normal blood pressure readings daily at home. Stable. Continue current medications. No change in management. Will follow BP over the next few visits and increase medication as indicated.  Discussed DASH diet and dietary sodium restrictions.  Increase dietary efforts and physical activity. Lab work today.

## 2024-03-18 NOTE — Assessment & Plan Note (Signed)
 Follows with GI. Miralax and colace daily. Recommend Miralax clean out, 1 capful every 6 hours, if she has not had a BM by tomorrow. Advised follow up with GI. Advised increase fiber and fluid intake.

## 2024-03-18 NOTE — Assessment & Plan Note (Signed)
 Stable. Continue with current management without changes. Discussed healthy diet and lifestyle. Medication refilled today.  Lab work today.

## 2024-03-19 LAB — HEMOGLOBIN A1C
Est. average glucose Bld gHb Est-mCnc: 117 mg/dL
Hgb A1c MFr Bld: 5.7 % — ABNORMAL HIGH (ref 4.8–5.6)

## 2024-03-19 LAB — CBC WITH DIFFERENTIAL/PLATELET
Basophils Absolute: 0 10*3/uL (ref 0.0–0.2)
Basos: 1 %
EOS (ABSOLUTE): 0.1 10*3/uL (ref 0.0–0.4)
Eos: 4 %
Hematocrit: 40 % (ref 34.0–46.6)
Hemoglobin: 13.1 g/dL (ref 11.1–15.9)
Immature Grans (Abs): 0 10*3/uL (ref 0.0–0.1)
Immature Granulocytes: 0 %
Lymphocytes Absolute: 0.9 10*3/uL (ref 0.7–3.1)
Lymphs: 29 %
MCH: 27.1 pg (ref 26.6–33.0)
MCHC: 32.8 g/dL (ref 31.5–35.7)
MCV: 83 fL (ref 79–97)
Monocytes Absolute: 0.2 10*3/uL (ref 0.1–0.9)
Monocytes: 6 %
Neutrophils Absolute: 2 10*3/uL (ref 1.4–7.0)
Neutrophils: 60 %
Platelets: 249 10*3/uL (ref 150–450)
RBC: 4.83 x10E6/uL (ref 3.77–5.28)
RDW: 13.8 % (ref 11.7–15.4)
WBC: 3.3 10*3/uL — ABNORMAL LOW (ref 3.4–10.8)

## 2024-03-19 LAB — LIPID PANEL
Chol/HDL Ratio: 2.2 ratio (ref 0.0–4.4)
Cholesterol, Total: 177 mg/dL (ref 100–199)
HDL: 80 mg/dL (ref >39)
LDL Chol Calc (NIH): 86 mg/dL (ref 0–99)
Triglycerides: 54 mg/dL (ref 0–149)
VLDL Cholesterol Cal: 11 mg/dL (ref 5–40)

## 2024-03-25 DIAGNOSIS — H2513 Age-related nuclear cataract, bilateral: Secondary | ICD-10-CM | POA: Diagnosis not present

## 2024-03-25 DIAGNOSIS — H43822 Vitreomacular adhesion, left eye: Secondary | ICD-10-CM | POA: Diagnosis not present

## 2024-03-25 DIAGNOSIS — H16223 Keratoconjunctivitis sicca, not specified as Sjogren's, bilateral: Secondary | ICD-10-CM | POA: Diagnosis not present

## 2024-04-24 NOTE — H&P (Signed)
 Surgical History & Physical  Patient Name: Emily Jenkins  DOB: 1946/11/12  Surgery: Cataract extraction with intraocular lens implant phacoemulsification; Left Eye Surgeon: Ardeth Krabbe MD Surgery Date: 05/02/2024 Pre-Op Date: 03/25/2024  HPI: A 47 Yr. old female patient present for cataract eval per Dr. Barbra Boone. 1. The patient complains of difficulty when viewing TV, reading closed caption, poor night vision, difficulties reading the newspaper and seeing fine print. Which began 1 year ago. Both eyes are affected, OS>OD. The episode is constant. The condition's severity is worsening. This is negatively affecting the patient's quality of life and the patient is unable to function adequately in life with the current level of vision.  Medical History: Cataracts  Arthritis High Blood Pressure LDL  Review of Systems Cardiovascular High Blood Pressure Musculoskeletal arthritis pains All recorded systems are negative except as noted above.  Social Never smoked   Medication Prednisolone acetate, Ciprofloxacin HCl,  Pantoprazole , Rosuvastatin , Telmisartan , Hydrochlorothiazide   Sx/Procedures Ankle surgery, Knee surgery, Tubal Ligation  Drug Allergies  NKDA  History & Physical: Heent: cataract NECK: supple without bruits LUNGS: lungs clear to auscultation CV: regular rate and rhythm Abdomen: soft and non-tender  Impression & Plan: Assessment: 1.  CATARACT NUCLEAR SCLEROSIS AGE RELATED; Both Eyes (H25.13) 2.  KERATOCONJUNCTIVITIS SICCA NOT SPECIFIED AS SJORGRENS; Both Eyes (H16.223) 3.  VITREOMACULAR TRACTION VMT; Left Eye [phone])  Plan: 1.  Cataracts are visually significant and account for the patient's complaints. Discussed all risks, benefits, procedures and recovery, including infection, loss of vision and eye, need for glasses after surgery or additional procedures. Patient understands changing glasses will not improve vision. Patient indicated understanding of  procedure. All questions answered. Patient desires to have surgery, recommend phacoemulsification with intraocular lens. Patient to have preliminary testing necessary ([employer]/IOL Master, Mac OCT, TOPO) Educational materials provided:Cataract.  Plan: - Proceed with cataract surgery with OS first, followed by OD - Plan for best distance target with DIB00 - No DM, no fuchs, no prior eye surgery - moderate dilation  2.  Dry eye. Mild signs of dry eye at this time. Can use artificial tears QID OU PRN and warm compresses once daily as needed.  3.  Mild, monitor for now

## 2024-04-29 DIAGNOSIS — H2512 Age-related nuclear cataract, left eye: Secondary | ICD-10-CM | POA: Diagnosis not present

## 2024-04-30 ENCOUNTER — Encounter (HOSPITAL_COMMUNITY)
Admission: RE | Admit: 2024-04-30 | Discharge: 2024-04-30 | Disposition: A | Discharge status: Home / Self Care | Source: Ambulatory Visit | Attending: Optometry | Admitting: Optometry

## 2024-04-30 ENCOUNTER — Encounter (HOSPITAL_COMMUNITY): Payer: Self-pay

## 2024-05-02 ENCOUNTER — Encounter (HOSPITAL_COMMUNITY)
Admission: RE | Disposition: A | Discharge status: Home / Self Care | Payer: Self-pay | Source: Home / Self Care | Attending: Optometry

## 2024-05-02 ENCOUNTER — Ambulatory Visit (HOSPITAL_COMMUNITY)

## 2024-05-02 ENCOUNTER — Ambulatory Visit (HOSPITAL_COMMUNITY)
Admission: RE | Admit: 2024-05-02 | Discharge: 2024-05-02 | Disposition: A | Discharge status: Home / Self Care | Attending: Optometry | Admitting: Optometry

## 2024-05-02 DIAGNOSIS — H2512 Age-related nuclear cataract, left eye: Secondary | ICD-10-CM | POA: Diagnosis not present

## 2024-05-02 DIAGNOSIS — I10 Essential (primary) hypertension: Secondary | ICD-10-CM | POA: Insufficient documentation

## 2024-05-02 DIAGNOSIS — F419 Anxiety disorder, unspecified: Secondary | ICD-10-CM | POA: Insufficient documentation

## 2024-05-02 DIAGNOSIS — M3501 Sicca syndrome with keratoconjunctivitis: Secondary | ICD-10-CM | POA: Diagnosis not present

## 2024-05-02 DIAGNOSIS — F32A Depression, unspecified: Secondary | ICD-10-CM | POA: Insufficient documentation

## 2024-05-02 HISTORY — PX: CATARACT EXTRACTION W/PHACO: SHX586

## 2024-05-02 SURGERY — PHACOEMULSIFICATION, CATARACT, WITH IOL INSERTION
Anesthesia: Monitor Anesthesia Care | Site: Eye | Laterality: Left

## 2024-05-02 MED ORDER — MIDAZOLAM HCL 2 MG/2ML IJ SOLN
INTRAMUSCULAR | Status: DC | PRN
  Administered 2024-05-02: 1 mg via INTRAVENOUS

## 2024-05-02 MED ORDER — TETRACAINE HCL 0.5 % OP SOLN
1.0000 [drp] | OPHTHALMIC | Status: AC | PRN
  Administered 2024-05-02 (×3): 1 [drp] via OPHTHALMIC

## 2024-05-02 MED ORDER — LIDOCAINE HCL (PF) 1 % IJ SOLN
INTRAMUSCULAR | Status: DC | PRN
Start: 2024-05-02 — End: 2024-05-02
  Administered 2024-05-02: 1 mL

## 2024-05-02 MED ORDER — PHENYLEPHRINE HCL 2.5 % OP SOLN
1.0000 [drp] | OPHTHALMIC | Status: AC | PRN
  Administered 2024-05-02 (×3): 1 [drp] via OPHTHALMIC

## 2024-05-02 MED ORDER — POVIDONE-IODINE 5 % OP SOLN
OPHTHALMIC | Status: DC | PRN
  Administered 2024-05-02: 1 via OPHTHALMIC

## 2024-05-02 MED ORDER — BSS IO SOLN
INTRAOCULAR | Status: DC | PRN
  Administered 2024-05-02: 15 mL via INTRAOCULAR

## 2024-05-02 MED ORDER — STERILE WATER FOR IRRIGATION IR SOLN
Status: DC | PRN
  Administered 2024-05-02: 250 mL

## 2024-05-02 MED ORDER — SIGHTPATH DOSE#1 NA HYALUR & NA CHOND-NA HYALUR IO KIT
PACK | INTRAOCULAR | Status: DC | PRN
  Administered 2024-05-02: 1 via OPHTHALMIC

## 2024-05-02 MED ORDER — SODIUM CHLORIDE 0.9% FLUSH
INTRAVENOUS | Status: DC | PRN
  Administered 2024-05-02: 6 mL via INTRAVENOUS

## 2024-05-02 MED ORDER — PHENYLEPHRINE-KETOROLAC 1-0.3 % IO SOLN
INTRAOCULAR | Status: DC | PRN
  Administered 2024-05-02: 500 mL via OPHTHALMIC

## 2024-05-02 MED ORDER — TROPICAMIDE 1 % OP SOLN
1.0000 [drp] | OPHTHALMIC | Status: AC | PRN
  Administered 2024-05-02 (×3): 1 [drp] via OPHTHALMIC

## 2024-05-02 MED ORDER — MIDAZOLAM HCL 2 MG/2ML IJ SOLN
INTRAMUSCULAR | Status: AC
  Filled 2024-05-02: qty 2

## 2024-05-02 MED ORDER — LIDOCAINE HCL 3.5 % OP GEL
1.0000 | Freq: Once | OPHTHALMIC | Status: AC
  Administered 2024-05-02: 1 via OPHTHALMIC

## 2024-05-02 MED ORDER — TETRACAINE 0.5 % OP SOLN OPTIME - NO CHARGE
OPHTHALMIC | Status: DC | PRN
  Administered 2024-05-02: 1 [drp] via OPHTHALMIC

## 2024-05-02 SURGICAL SUPPLY — 14 items
CATARACT SUITE SIGHTPATH (MISCELLANEOUS) ×1 IMPLANT
CLOTH BEACON ORANGE TIMEOUT ST (SAFETY) ×2 IMPLANT
DRSG TEGADERM 4X4.75 (GAUZE/BANDAGES/DRESSINGS) ×2 IMPLANT
EYE SHIELD UNIVERSAL CLEAR (GAUZE/BANDAGES/DRESSINGS) IMPLANT
FEE CATARACT SUITE SIGHTPATH (MISCELLANEOUS) ×2 IMPLANT
GLOVE BIOGEL PI IND STRL 7.0 (GLOVE) ×4 IMPLANT
LENS IOL TECNIS EYHANCE 20.0 (Intraocular Lens) IMPLANT
NDL HYPO 18GX1.5 BLUNT FILL (NEEDLE) ×1 IMPLANT
NEEDLE HYPO 18GX1.5 BLUNT FILL (NEEDLE) ×1 IMPLANT
PAD ARMBOARD POSITIONER FOAM (MISCELLANEOUS) ×1 IMPLANT
POSITIONER HEAD 8X9X4 ADT (SOFTGOODS) ×2 IMPLANT
SYR TB 1ML LL NO SAFETY (SYRINGE) ×1 IMPLANT
TAPE SURG TRANSPORE 1 IN (GAUZE/BANDAGES/DRESSINGS) IMPLANT
WATER STERILE IRR 250ML POUR (IV SOLUTION) ×2 IMPLANT

## 2024-05-02 NOTE — Anesthesia Preprocedure Evaluation (Addendum)
 Anesthesia Evaluation  Patient identified by MRN, date of birth, ID band Patient awake    Reviewed: Allergy & Precautions, H&P , NPO status , Patient's Chart, lab work & pertinent test results  Airway Mallampati: II  TM Distance: >3 FB Neck ROM: Full    Dental no notable dental hx.    Pulmonary neg pulmonary ROS   Pulmonary exam normal breath sounds clear to auscultation       Cardiovascular hypertension, Normal cardiovascular exam Rhythm:Regular Rate:Normal     Neuro/Psych  PSYCHIATRIC DISORDERS Anxiety Depression     Neuromuscular disease    GI/Hepatic Neg liver ROS,GERD  ,,  Endo/Other  negative endocrine ROS    Renal/GU negative Renal ROS  negative genitourinary   Musculoskeletal  (+) Arthritis ,    Abdominal   Peds negative pediatric ROS (+)  Hematology negative hematology ROS (+)   Anesthesia Other Findings   Reproductive/Obstetrics negative OB ROS                             Anesthesia Physical Anesthesia Plan  ASA: 2  Anesthesia Plan: MAC   Post-op Pain Management:    Induction:   PONV Risk Score and Plan: Treatment may vary due to age or medical condition  Airway Management Planned: Nasal Cannula  Additional Equipment:   Intra-op Plan:   Post-operative Plan:   Informed Consent: I have reviewed the patients History and Physical, chart, labs and discussed the procedure including the risks, benefits and alternatives for the proposed anesthesia with the patient or authorized representative who has indicated his/her understanding and acceptance.     Dental advisory given  Plan Discussed with: CRNA  Anesthesia Plan Comments:        Anesthesia Quick Evaluation

## 2024-05-02 NOTE — Anesthesia Procedure Notes (Signed)
 Procedure Name: MAC Date/Time: 05/02/2024 11:57 AM  Performed by: Sherwin Donate, CRNAPre-anesthesia Checklist: Patient identified, Emergency Drugs available, Suction available and Patient being monitored Patient Re-evaluated:Patient Re-evaluated prior to induction Oxygen Delivery Method: Nasal cannula Placement Confirmation: positive ETCO2

## 2024-05-02 NOTE — Interval H&P Note (Signed)
 History and Physical Interval Note:  05/02/2024 11:14 AM  The H and P was reviewed and updated. The patient was examined.  No changes were found after exam.  The surgical eye was marked.  Emily Jenkins

## 2024-05-02 NOTE — Discharge Instructions (Signed)
 Please discharge patient when stable, will follow up today with Dr. Ilsa Iha at the [facility] office immediately following discharge.  Leave shield in place until visit.  All paperwork with discharge instructions will be given at the office.  [facility] Address:  [address]  [place], [place] [address]  Dr. Chaya Jan Phone: [phone]

## 2024-05-02 NOTE — Transfer of Care (Signed)
 Immediate Anesthesia Transfer of Care Note  Patient: Emily Jenkins  Procedure(s) Performed: PHACOEMULSIFICATION, CATARACT, WITH IOL INSERTION (Left: Eye)  Patient Location: Short Stay  Anesthesia Type:MAC  Level of Consciousness: awake, alert , and oriented  Airway & Oxygen Therapy: Patient Spontanous Breathing  Post-op Assessment: Report given to RN and Post -op Vital signs reviewed and stable  Post vital signs: Reviewed and stable  Last Vitals:  Vitals Value Taken Time  BP    Temp    Pulse    Resp    SpO2      Last Pain:  Vitals:   05/02/24 1115  PainSc: 0-No pain         Complications: No notable events documented.

## 2024-05-02 NOTE — Anesthesia Postprocedure Evaluation (Signed)
 Anesthesia Post Note  Patient: Emily Jenkins  Procedure(s) Performed: PHACOEMULSIFICATION, CATARACT, WITH IOL INSERTION (Left: Eye)  Patient location during evaluation: PACU Anesthesia Type: MAC Level of consciousness: awake and alert Pain management: pain level controlled Vital Signs Assessment: post-procedure vital signs reviewed and stable Respiratory status: spontaneous breathing, nonlabored ventilation, respiratory function stable and patient connected to nasal cannula oxygen Cardiovascular status: stable and blood pressure returned to baseline Postop Assessment: no apparent nausea or vomiting Anesthetic complications: no  No notable events documented.   Last Vitals:  Vitals:   05/02/24 1134 05/02/24 1223  BP: (!) 174/83 (!) 147/80  Pulse: 71 73  Resp: 17 20  Temp:  36.9 C  SpO2: 99% 100%    Last Pain:  Vitals:   05/02/24 1223  TempSrc: Oral  PainSc:                  [place]

## 2024-05-02 NOTE — Op Note (Signed)
 Date of procedure: 05/02/24  Pre-operative diagnosis: Visually significant age-related nuclear cataract, Left Eye (H25.12)  Post-operative diagnosis: Visually significant age-related nuclear cataract, Left Eye H25.12  Procedure: Removal of cataract via phacoemulsification and insertion of intra-ocular lens J&J DIB00 +20.0D into the capsular bag of the Left Eye  Attending surgeon: Gale Jude, MD  Anesthesia: MAC, Topical Akten  Complications: None  Estimated Blood Loss: <30mL (minimal)  Specimens: None  Implants:  Implant Name Type Inv. Item Serial No. Manufacturer Lot No. LRB No. Used Action  LENS IOL TECNIS EYHANCE 20.0 - [id number] Intraocular Lens LENS IOL TECNIS EYHANCE 20.0 [id number] [employer]  Left 1 Implanted    Indications:  Visually significant age-related cataract, Left Eye  Procedure:  The patient was seen and identified in the pre-operative area. The operative eye was identified and dilated.  The operative eye was marked.  Topical anesthesia was administered to the operative eye.     The patient was then to the operative suite and placed in the supine position.  A timeout was performed confirming the patient, procedure to be performed, and all other relevant information.   The patient's face was prepped and draped in the usual fashion for intra-ocular surgery.  A lid speculum was placed into the operative eye and the surgical microscope moved into place and focused.  An inferotemporal paracentesis was created using a 20 gauge paracentesis blade.  BSS mixed with Omidria, followed by 1% lidocaine  was injected into the anterior chamber.  Viscoelastic was injected into the anterior chamber.  A temporal clear-corneal main wound incision was created using a 2.5mm microkeratome.  A continuous curvilinear capsulorrhexis was initiated using an irrigating cystitome and completed using capsulorrhexis forceps.  Hydrodissection and hydrodeliniation were performed.  Viscoelastic  was injected into the anterior chamber.  A phacoemulsification handpiece and a chopper as a second instrument were used to remove the nucleus and epinucleus. The irrigation/aspiration handpiece was used to remove any remaining cortical material.   The capsular bag was reinflated with viscoelastic, checked, and found to be intact.  The intraocular lens was inserted into the capsular bag.  The irrigation/aspiration handpiece was used to remove any remaining viscoelastic.  The clear corneal wound and paracentesis wounds were then hydrated and checked with Weck-Cels to be watertight. Moxifloxacin was instilled into the anterior chamber.  The lid-speculum and drape were removed. The patient's face was cleaned with a wet and dry 4x4.  A clear shield was taped over the eye. The patient was taken to the post-operative care unit in good condition, having tolerated the procedure well.  Post-Op Instructions: The patient will follow up at [facility] for a same day post-operative evaluation and will receive all other orders and instructions.

## 2024-05-06 ENCOUNTER — Encounter (HOSPITAL_COMMUNITY): Payer: Self-pay | Admitting: Optometry

## 2024-05-16 ENCOUNTER — Encounter (HOSPITAL_COMMUNITY)

## 2024-05-20 ENCOUNTER — Ambulatory Visit (INDEPENDENT_AMBULATORY_CARE_PROVIDER_SITE_OTHER): Admitting: Nurse Practitioner

## 2024-05-20 ENCOUNTER — Ambulatory Visit: Admitting: Physician Assistant

## 2024-05-20 ENCOUNTER — Encounter: Payer: Self-pay | Admitting: Nurse Practitioner

## 2024-05-20 VITALS — BP 155/76 | HR 68 | Temp 99.3°F | Ht 65.0 in | Wt 156.0 lb

## 2024-05-20 DIAGNOSIS — F5105 Insomnia due to other mental disorder: Secondary | ICD-10-CM | POA: Diagnosis not present

## 2024-05-20 DIAGNOSIS — K219 Gastro-esophageal reflux disease without esophagitis: Secondary | ICD-10-CM

## 2024-05-20 DIAGNOSIS — R5383 Other fatigue: Secondary | ICD-10-CM

## 2024-05-20 DIAGNOSIS — E042 Nontoxic multinodular goiter: Secondary | ICD-10-CM

## 2024-05-20 DIAGNOSIS — R600 Localized edema: Secondary | ICD-10-CM | POA: Diagnosis not present

## 2024-05-20 DIAGNOSIS — F418 Other specified anxiety disorders: Secondary | ICD-10-CM | POA: Diagnosis not present

## 2024-05-20 DIAGNOSIS — K5909 Other constipation: Secondary | ICD-10-CM | POA: Diagnosis not present

## 2024-05-20 MED ORDER — HYDROCHLOROTHIAZIDE 25 MG PO TABS: 25.0000 mg | ORAL_TABLET | Freq: Every day | ORAL | 0 refills | Status: DC

## 2024-05-20 MED ORDER — FLUOXETINE HCL 10 MG PO CAPS: 10.0000 mg | ORAL_CAPSULE | Freq: Every day | ORAL | 0 refills | Status: DC

## 2024-05-20 NOTE — Patient Instructions (Signed)
 Increase hydrochlorothiazide  to 2 pills once a day (25 mg total)

## 2024-05-20 NOTE — Progress Notes (Signed)
 Subjective:    Patient ID: Emily Jenkins, female    DOB: [birth year], 78 y.o.   MRN: [id number]  HPI Presents for complaints of fatigue off and on for several months.  Lives alone.  Having some difficulty resting at night.  Takes occasional melatonin which helps.  Does not like to take this every night.  Has noticed an increase in her hunger and some weight gain lately.  History of GERD, takes pantoprazole  40 mg daily, states her symptoms have improved.  No tobacco alcohol or excessive NSAID use.  Does like to eat hot spicy foods.  Has been experiencing some upper epigastric discomfort and bloating at times.  Describes as a "knot in her chest, points to the upper epigastric area just below the sternum.  Denies any right upper quadrant discomfort.  Has a history of chronic constipation.  Last colonoscopy was 08/14/2020.  Last seen by endocrinology for multinodular goiter on 10/22/2023. Has noticed increased swelling in her lower legs lately.  Denies any increased sodium in her diet.  No orthopnea.  BP at home this morning 138/72.  Uses a wrist monitor which she purchased.  Review of Systems  Constitutional:  Positive for fatigue.  HENT:  Negative for sore throat and trouble swallowing.   Respiratory:  Negative for cough, chest tightness, shortness of breath and wheezing.   Cardiovascular:  Positive for leg swelling. Negative for chest pain.  Gastrointestinal:  Positive for abdominal pain and constipation. Negative for blood in stool and diarrhea.      05/20/2024    9:59 AM  Depression screen PHQ 2/9  Decreased Interest 1  Down, Depressed, Hopeless 1  PHQ - 2 Score 2  Altered sleeping 2  Tired, decreased energy 3  Change in appetite 1  Feeling bad or failure about yourself  1  Trouble concentrating 1  Moving slowly or fidgety/restless 0  Suicidal thoughts 0  PHQ-9 Score 10  Difficult doing work/chores Somewhat difficult      05/20/2024   10:00 AM 03/18/2024    1:55 PM  GAD 7 :  Generalized Anxiety Score  Nervous, Anxious, on Edge 2 2  Control/stop worrying 2 0  Worry too much - different things 2 1  Trouble relaxing 1 1  Restless 1 1  Easily annoyed or irritable 0 1  Afraid - awful might happen 0 1  Total GAD 7 Score 8 7  Anxiety Difficulty Somewhat difficult Somewhat difficult    Social History   Tobacco Use   Smoking status: Never   Smokeless tobacco: Never   Tobacco comments:    SPOUSE SMOKES  Vaping Use   Vaping status: Never Used  Substance Use Topics   Alcohol use: No    Alcohol/week: 0.0 standard drinks of alcohol   Drug use: No        Objective:   Physical Exam Vitals reviewed.  Constitutional:      General: She is not in acute distress.    Appearance: Normal appearance.  Neck:     Vascular: No carotid bruit.     Comments: Thyroid  nontender to palpation, no mass or goiter noted. Cardiovascular:     Rate and Rhythm: Normal rate and regular rhythm.     Heart sounds: Normal heart sounds. No murmur heard.    No gallop.  Pulmonary:     Effort: Pulmonary effort is normal.     Breath sounds: Normal breath sounds.  Abdominal:     General: There is no distension.  Palpations: Abdomen is soft. There is no mass.     Tenderness: There is abdominal tenderness.     Comments: Moderate tenderness noted in the upper epigastric area.  Exam otherwise benign.  Musculoskeletal:     Cervical back: Neck supple.     Right lower leg: Edema present.     Left lower leg: Edema present.     Comments: Trace to 1+ pitting edema lower extremities bilaterally.  Neurological:     Mental Status: She is alert.  Psychiatric:        Mood and Affect: Mood normal.        Behavior: Behavior normal.        Thought Content: Thought content normal.        Judgment: Judgment normal.    Today's Vitals   05/20/24 0950 05/20/24 1002  BP: (!) 153/73 (!) 155/76  Pulse: 68   Temp: 99.3 F (37.4 C)   SpO2: 98%   Weight: 156 lb (70.8 kg)   Height: 5\' 5"  (1.651  m)    Body mass index is 25.96 kg/m.  09/04/2023: TSH 1.82. Results for orders placed or performed in visit on 03/18/24  CBC with Differential   Collection Time: 03/18/24  2:33 PM  Result Value Ref Range   WBC 3.3 (L) 3.4 - 10.8 x10E3/uL   RBC 4.83 3.77 - 5.28 x10E6/uL   Hemoglobin 13.1 11.1 - 15.9 g/dL   Hematocrit 01.0 27.2 - 46.6 %   MCV 83 79 - 97 fL   MCH 27.1 26.6 - 33.0 pg   MCHC 32.8 31.5 - 35.7 g/dL   RDW 53.6 64.4 - 03.4 %   Platelets 249 150 - 450 x10E3/uL   Neutrophils 60 Not Estab. %   Lymphs 29 Not Estab. %   Monocytes 6 Not Estab. %   Eos 4 Not Estab. %   Basos 1 Not Estab. %   Neutrophils Absolute 2.0 1.4 - 7.0 x10E3/uL   Lymphocytes Absolute 0.9 0.7 - 3.1 x10E3/uL   Monocytes Absolute 0.2 0.1 - 0.9 x10E3/uL   EOS (ABSOLUTE) 0.1 0.0 - 0.4 x10E3/uL   Basophils Absolute 0.0 0.0 - 0.2 x10E3/uL   Immature Granulocytes 0 Not Estab. %   Immature Grans (Abs) 0.0 0.0 - 0.1 x10E3/uL  Lipid Panel   Collection Time: 03/18/24  2:33 PM  Result Value Ref Range   Cholesterol, Total 177 100 - 199 mg/dL   Triglycerides 54 0 - 149 mg/dL   HDL 80 >74 mg/dL   VLDL Cholesterol Cal 11 5 - 40 mg/dL   LDL Chol Calc (NIH) 86 0 - 99 mg/dL   Chol/HDL Ratio 2.2 0.0 - 4.4 ratio  Hemoglobin A1c   Collection Time: 03/18/24  2:33 PM  Result Value Ref Range   Hgb A1c MFr Bld 5.7 (H) 4.8 - 5.6 %   Est. average glucose Bld gHb Est-mCnc 117 mg/dL   WBC count improved over time and close to normal.      Assessment & Plan:   Problem List Items Addressed This Visit       Digestive   Chronic constipation   Relevant Orders   Ambulatory referral to Gastroenterology   GERD    Relevant Orders   Ambulatory referral to Gastroenterology     Endocrine   Multinodular goiter   Relevant Orders   TSH   T4, free     Other   Depression with anxiety   Relevant Medications   FLUoxetine (PROZAC) 10 MG capsule  Edema of both lower extremities   Insomnia secondary to depression with  anxiety   Relevant Medications   FLUoxetine (PROZAC) 10 MG capsule   Other Visit Diagnoses       Fatigue, unspecified type    -  Primary         Meds ordered this encounter  Medications   FLUoxetine (PROZAC) 10 MG capsule    Sig: Take 1 capsule (10 mg total) by mouth daily.    Dispense:  30 capsule    Refill:  0    Supervising Provider:   Charlotta Cook A [[id number]]   hydrochlorothiazide  (HYDRODIURIL ) 25 MG tablet    Sig: Take 1 tablet (25 mg total) by mouth daily.    Dispense:  90 tablet    Refill:  0    Supervising Provider:   Bennet Brasil [phone]   Feel that some of the trouble sleeping may be due to to her living alone and safety issues. Start fluoxetine 10 mg daily.  Reviewed potential adverse effects.  Discontinue medication and contact office if any problems. Increase hydrochlorothiazide  to 25 mg daily for edema.  Be careful about excessive sodium in her diet. Due to persistent GERD symptoms and abdominal pain on daily Protonix , will refer to local GI specialist for evaluation. Return in about 1 month (around 06/19/2024).

## 2024-05-21 ENCOUNTER — Ambulatory Visit: Payer: Self-pay | Admitting: Nurse Practitioner

## 2024-05-21 LAB — T4, FREE: Free T4: 1.3 ng/dL (ref 0.82–1.77)

## 2024-05-21 LAB — TSH: TSH: 2.32 u[IU]/mL (ref 0.450–4.500)

## 2024-05-23 ENCOUNTER — Encounter (HOSPITAL_COMMUNITY): Admission: RE | Payer: Self-pay | Source: Home / Self Care

## 2024-05-23 ENCOUNTER — Ambulatory Visit (HOSPITAL_COMMUNITY): Admission: RE | Admit: 2024-05-23 | Source: Home / Self Care | Admitting: Optometry

## 2024-05-23 SURGERY — PHACOEMULSIFICATION, CATARACT, WITH IOL INSERTION
Anesthesia: Monitor Anesthesia Care | Laterality: Right

## 2024-05-30 ENCOUNTER — Encounter: Payer: Medicare HMO | Admitting: Nurse Practitioner

## 2024-06-09 ENCOUNTER — Other Ambulatory Visit: Payer: Self-pay | Admitting: Physician Assistant

## 2024-06-09 DIAGNOSIS — I10 Essential (primary) hypertension: Secondary | ICD-10-CM

## 2024-06-11 ENCOUNTER — Other Ambulatory Visit: Payer: Self-pay | Admitting: Nurse Practitioner

## 2024-06-19 ENCOUNTER — Ambulatory Visit: Admitting: Physician Assistant

## 2024-06-19 ENCOUNTER — Encounter: Payer: Self-pay | Admitting: Physician Assistant

## 2024-06-19 VITALS — BP 122/70 | HR 60 | Temp 98.1°F | Ht 65.0 in | Wt 154.0 lb

## 2024-06-19 DIAGNOSIS — G4700 Insomnia, unspecified: Secondary | ICD-10-CM

## 2024-06-19 DIAGNOSIS — G4762 Sleep related leg cramps: Secondary | ICD-10-CM | POA: Diagnosis not present

## 2024-06-19 NOTE — Progress Notes (Signed)
   Established Patient Office Visit  Subjective   Patient ID: Emily Jenkins, female    DOB: 24-Apr-1946  Age: 78 y.o. MRN: [id number]  Chief Complaint  Patient presents with   Follow-up    1 month f/u. Medication concern with Prozac . Feels like something is crawling in skin     Patient presents today for 1 month follow up regarding Prozac  for insomnia related to depression. Patient denies concerns for depression today. She relates concerns for side effects of medication including increased sweating and skin crawling sensation. She states insomnia has persisted despite medication. She reports intermittent leg cramps at night time. Denies chest pain, shortness of breath, orthopnea, or leg swelling today. Has tried melatonin in the past for sleep with improvement of symptoms.      Review of Systems  Constitutional:  Positive for diaphoresis. Negative for chills, fever, malaise/fatigue and weight loss.  Cardiovascular:  Negative for chest pain, palpitations and leg swelling.  Neurological:  Negative for dizziness and headaches.  Psychiatric/Behavioral:  Negative for depression and memory loss. The patient has insomnia. The patient is not nervous/anxious.       Objective:     BP 122/70   Pulse 60   Temp 98.1 F (36.7 C)   Ht 5' 5 (1.651 m)   Wt 154 lb (69.9 kg)   SpO2 96%   BMI 25.63 kg/m    Physical Exam Constitutional:      Appearance: Normal appearance.  HENT:     Head: Normocephalic.     Mouth/Throat:     Mouth: Mucous membranes are moist.     Pharynx: Oropharynx is clear.  Eyes:     Extraocular Movements: Extraocular movements intact.     Conjunctiva/sclera: Conjunctivae normal.  Cardiovascular:     Rate and Rhythm: Normal rate and regular rhythm.     Heart sounds: Normal heart sounds. No murmur heard. Pulmonary:     Effort: Pulmonary effort is normal.     Breath sounds: Normal breath sounds. No wheezing or rales.  Musculoskeletal:     Right lower leg: No  edema.     Left lower leg: No edema.  Skin:    General: Skin is warm and dry.  Neurological:     General: No focal deficit present.     Mental Status: She is alert and oriented to person, place, and time.  Psychiatric:        Mood and Affect: Mood normal.        Behavior: Behavior normal.      No results found for any visits on 06/19/24.  The 10-year ASCVD risk score (Arnett DK, et al., 2019) is: 18.2%    Assessment & Plan:   Return if symptoms worsen or fail to improve.   Insomnia, unspecified type Assessment & Plan: Patient presents today with continued insomnia. Discontinuing Prozac  due to reported side effects of increased sweating and skin crawling sensation. BMP today for complaints of leg cramps. Advised sleep hygiene. Advised melatonin at bed time for insomnia. Patient to follow up as needed if symptoms do not improve.    Leg cramps, sleep related -     Basic metabolic panel with GFR    Charmaine Simren Popson, PA-C

## 2024-06-19 NOTE — Assessment & Plan Note (Signed)
 Patient presents today with continued insomnia. Discontinuing Prozac  due to reported side effects of increased sweating and skin crawling sensation. BMP today for complaints of leg cramps. Advised sleep hygiene. Advised melatonin at bed time for insomnia. Patient to follow up as needed if symptoms do not improve.

## 2024-06-20 ENCOUNTER — Ambulatory Visit: Payer: Self-pay | Admitting: Physician Assistant

## 2024-06-20 LAB — BASIC METABOLIC PANEL WITH GFR
BUN/Creatinine Ratio: 16 (ref 12–28)
BUN: 14 mg/dL (ref 8–27)
CO2: 23 mmol/L (ref 20–29)
Calcium: 10.3 mg/dL (ref 8.7–10.3)
Chloride: 100 mmol/L (ref 96–106)
Creatinine, Ser: 0.86 mg/dL (ref 0.57–1.00)
Glucose: 62 mg/dL — ABNORMAL LOW (ref 70–99)
Potassium: 3.7 mmol/L (ref 3.5–5.2)
Sodium: 140 mmol/L (ref 134–144)
eGFR: 69 mL/min/1.73 (ref >59)

## 2024-07-01 ENCOUNTER — Encounter: Payer: Self-pay | Admitting: Gastroenterology

## 2024-07-08 ENCOUNTER — Encounter: Payer: Self-pay | Admitting: Gastroenterology

## 2024-07-08 ENCOUNTER — Ambulatory Visit (INDEPENDENT_AMBULATORY_CARE_PROVIDER_SITE_OTHER): Admitting: Gastroenterology

## 2024-07-08 VITALS — BP 138/67 | HR 80 | Temp 98.7°F | Ht 65.5 in | Wt 155.2 lb

## 2024-07-08 DIAGNOSIS — K5909 Other constipation: Secondary | ICD-10-CM

## 2024-07-08 DIAGNOSIS — R1310 Dysphagia, unspecified: Secondary | ICD-10-CM | POA: Insufficient documentation

## 2024-07-08 DIAGNOSIS — K219 Gastro-esophageal reflux disease without esophagitis: Secondary | ICD-10-CM

## 2024-07-08 DIAGNOSIS — R1013 Epigastric pain: Secondary | ICD-10-CM | POA: Diagnosis not present

## 2024-07-08 DIAGNOSIS — R1319 Other dysphagia: Secondary | ICD-10-CM

## 2024-07-08 DIAGNOSIS — K5900 Constipation, unspecified: Secondary | ICD-10-CM | POA: Diagnosis not present

## 2024-07-08 MED ORDER — LINACLOTIDE 145 MCG PO CAPS: 145.0000 ug | ORAL_CAPSULE | Freq: Every day | ORAL | 5 refills | Status: AC

## 2024-07-08 NOTE — Patient Instructions (Addendum)
 Start Linzess  72 mcg (take 2 capsules at one time) daily on empty stomach for constipation. YOUR PRESCRIPTION IS FOR DOSE SO YOU WILL ONLY TAKE 1 CAPSULE DAILY. You may have loose stools in the beginning but this gets better if you can stay on the medication daily for 1-2 weeks. I have given you samples and sent in RX. IF YOU GO TO PICK UP LINZESS  AND IT IS NOT COVERED OR EXPENSIVE, PLEASE DO NOT BUY IT. WE WILL LOOK FOR ANOTHER OPTION. Continue pantoprazole  40mg  daily before breakfast. You can stop colace and exlax once Linzess  is working.  Xray of your esophagus and hiatal hernia planned.

## 2024-07-08 NOTE — Progress Notes (Signed)
 GI Office Note    Referring Provider: Grooms, [place], [place] Primary Care Physician:  Grooms, [place], [place]  Primary Gastroenterologist: Carlin POUR. Cindie, DO   Chief Complaint   Chief Complaint  Patient presents with   Gastroesophageal Reflux    Has been taking pantoprazole  40 mg daily for about a year.     History of Present Illness   Emily Jenkins is a 78 y.o. female presenting today at the request of Elveria Quarry NP for GERD, chronic constipation. Last seen by Dr. Renaye Sous in 02/2024. Patient desired coming to our practice due to less traffic. She was widowed about 8 years ago. She lives alone.   She was given samples of Trulance by Dr. Sous. Took six days, took awhile to work. Then she has continued colace 100mg  BID. Finds Exlax (sennosides) most effective, takes 1-2 every other day. Typically has BM several times a week. Sometimes stools are hard. No melena, brbpr.   She notes knot in epigastric region when she bends over. Will stretch out to try and get relief. Takes a few minutes to get relief. She can feel the knot with her hand. It is not present today however.   Heartburn controlled, pantoprazole  every day. Feels like food and liquid sticking in upper chest. Chews food very thoroughly, chopped meat up finely. Takes several medications at one time, takes multiple tries to get them down.  Egd 03/2017: -small hiatal hernia  Colonoscopy 08/2020: -small sessile polyp prox asc colon removed, benign inflammatory myoglandular polyp -two 7-56mm sessile polyps sigmoid colon removed, hyperplastic polyp   Medications   Current Outpatient Medications  Medication Sig Dispense Refill   aspirin  81 MG tablet Take 81 mg by mouth every evening.      cholecalciferol (VITAMIN D ) 1000 units tablet Take 5,000 Units by mouth daily.      docusate sodium (COLACE) 100 MG capsule Take 100 mg by mouth 2 (two) times daily.     hydrochlorothiazide  (HYDRODIURIL ) 25 MG tablet  Take 1 tablet (25 mg total) by mouth daily. 90 tablet 0   pantoprazole  (PROTONIX ) 40 MG tablet Take 1 tablet Daily for Indigestion & Acid Reflux 90 tablet 3   prednisoLONE acetate (PRED FORTE) 1 % ophthalmic suspension Place 1 drop into both eyes 2 (two) times daily.     rosuvastatin  (CRESTOR ) 5 MG tablet Take  1 tablet  Daily for Cholesterol 90 tablet 3   telmisartan  (MICARDIS ) 80 MG tablet TAKE 1 TABLET (80 MG TOTAL) BY MOUTH DAILY. FOR BLOOD PRESSURE 90 tablet 0   vitamin C (ASCORBIC ACID) 500 MG tablet Take 500 mg by mouth daily.     No current facility-administered medications for this visit.    Allergies   Allergies as of 07/08/2024 - Review Complete 07/08/2024  Allergen Reaction Noted   Escitalopram  Itching 11/20/2018   Fosamax  [alendronate  sodium] Itching 12/01/2013   Levofloxacin   09/17/2017   Sulfa antibiotics Itching 12/01/2013   Tramadol  Itching 05/16/2016    Past Medical History   Past Medical History:  Diagnosis Date   Depression 09/22/2017   09/21/2017 PHQ-9: 9   DJD (degenerative joint disease)    GERD (gastroesophageal reflux disease)    Hyperlipidemia    Hypertension    Pre-diabetes    RLS (restless legs syndrome)     Past Surgical History   Past Surgical History:  Procedure Laterality Date   ANKLE SURGERY     CATARACT EXTRACTION W/PHACO Left 05/02/2024   Procedure: PHACOEMULSIFICATION, CATARACT,  WITH IOL INSERTION;  Surgeon: Juli Blunt, MD;  Location: [facility];  Service: Ophthalmology;  Laterality: Left;  CDE: 3.97   CHOLECYSTECTOMY     LEFT HEART CATHETERIZATION WITH CORONARY ANGIOGRAM N/A 07/29/2014   Procedure: LEFT HEART CATHETERIZATION WITH CORONARY ANGIOGRAM;  Surgeon: Lonni JONETTA Cash, MD;  Location: [facility] CATH LAB;  Service: Cardiovascular;  Laterality: N/A;   TUBAL LIGATION      Past Family History   Family History  Problem Relation Age of Onset   Heart disease Mother 12       stent   Hypertension Mother    Cancer Father     Diabetes Father    Hyperlipidemia Sister    Hypertension Sister     Past Social History   Social History   Socioeconomic History   Marital status: Widowed    Spouse name: Not on file   Number of children: 3   Years of education: Not on file   Highest education level: Not on file  Occupational History   Not on file  Tobacco Use   Smoking status: Never   Smokeless tobacco: Never   Tobacco comments:    SPOUSE SMOKES  Vaping Use   Vaping status: Never Used  Substance and Sexual Activity   Alcohol use: No    Alcohol/week: 0.0 standard drinks of alcohol   Drug use: No   Sexual activity: Yes  Other Topics Concern   Not on file  Social History Narrative   Lives with second husband.    Social Drivers of Corporate investment banker Strain: Not on file  Food Insecurity: Not on file  Transportation Needs: Not on file  Physical Activity: Not on file  Stress: Not on file  Social Connections: Not on file  Intimate Partner Violence: Not on file    Review of Systems   General: Negative for anorexia, weight loss, fever, chills, fatigue, weakness. Eyes: Negative for vision changes.  ENT: Negative for hoarseness, difficulty swallowing , nasal congestion. CV: Negative for chest pain, angina, palpitations, dyspnea on exertion, peripheral edema.  Respiratory: Negative for dyspnea at rest, dyspnea on exertion, cough, sputum, wheezing.  GI: See history of present illness. GU:  Negative for dysuria, hematuria, urinary incontinence, urinary frequency, nocturnal urination.  MS: Negative for joint pain, low back pain.  Derm: Negative for rash or itching.  Neuro: Negative for weakness, abnormal sensation, seizure, frequent headaches, memory loss,  confusion.  Psych: Negative for anxiety, depression, suicidal ideation, hallucinations.  Endo: Negative for unusual weight change.  Heme: Negative for bruising or bleeding. Allergy: Negative for rash or hives.  Physical Exam   BP 138/67  (BP Location: Right Arm, Patient Position: Sitting, Cuff Size: Normal)   Pulse 80   Temp 98.7 F (37.1 C) (Oral)   Ht 5' 5.5 (1.664 m)   Wt 155 lb 3.2 oz (70.4 kg)   SpO2 97%   BMI 25.43 kg/m    General: Well-nourished, well-developed in no acute distress.  Head: Normocephalic, atraumatic.   Eyes: Conjunctiva pink, no icterus. Mouth: Oropharyngeal mucosa moist and pink  Neck: Supple without thyromegaly, masses, or lymphadenopathy.  Lungs: Clear to auscultation bilaterally.  Heart: Regular rate and rhythm, no murmurs rubs or gallops.  Abdomen: Bowel sounds are normal,   nondistended, no hepatosplenomegaly or masses,  no abdominal bruits or hernia, no rebound or guarding.  Epigastric tenderness.  Rectal: not performed Extremities: No lower extremity edema. No clubbing or deformities.  Neuro: Alert and oriented x 4 ,  grossly normal neurologically.  Skin: Warm and dry, no rash or jaundice.   Psych: Alert and cooperative, normal mood and affect.  Labs   Lab Results  Component Value Date   TSH 2.320 05/20/2024   Lab Results  Component Value Date   NA 140 06/19/2024   CL 100 06/19/2024   K 3.7 06/19/2024   CO2 23 06/19/2024   BUN 14 06/19/2024   CREATININE 0.86 06/19/2024   EGFR 69 06/19/2024   CALCIUM  10.3 06/19/2024   ALBUMIN 3.5 09/10/2017   GLUCOSE 62 (L) 06/19/2024   Lab Results  Component Value Date   HGBA1C 5.7 (H) 03/18/2024   Lab Results  Component Value Date   WBC 3.3 (L) 03/18/2024   HGB 13.1 03/18/2024   HCT 40.0 03/18/2024   MCV 83 03/18/2024   PLT 249 03/18/2024   Lab Results  Component Value Date   ALT 8 09/04/2023   AST 16 09/04/2023   ALKPHOS 119 09/10/2017   BILITOT 0.6 09/04/2023    Imaging Studies   No results found.  Assessment/Plan:   GERD/epigastric tenderness/dysphagia: -discussed possible EGD vs UGI series. Patient prefers less invasive approach initially -UGI series -continue pantoprazole  40mg  daily before  breakfast  Constipation: -best managed with exlax (sennosides) but she prefers to get off the otc stool softeners/laxatives. She did not find miralax  effective -try linzess  145mcg daily. Samples and rx provided. She was advised to let me know if not covered or too costly so we could try other options       Sonny RAMAN. Ezzard, MHS, PA-C [facility]

## 2024-07-09 ENCOUNTER — Telehealth: Payer: Self-pay | Admitting: *Deleted

## 2024-07-09 NOTE — Telephone Encounter (Signed)
 Called pt and aware of UGI appt

## 2024-07-21 ENCOUNTER — Ambulatory Visit (HOSPITAL_COMMUNITY)
Admission: RE | Admit: 2024-07-21 | Discharge: 2024-07-21 | Disposition: A | Discharge status: Home / Self Care | Source: Ambulatory Visit | Attending: Gastroenterology | Admitting: Gastroenterology

## 2024-07-21 DIAGNOSIS — K219 Gastro-esophageal reflux disease without esophagitis: Secondary | ICD-10-CM | POA: Diagnosis not present

## 2024-07-21 DIAGNOSIS — R1319 Other dysphagia: Secondary | ICD-10-CM | POA: Diagnosis not present

## 2024-07-21 DIAGNOSIS — K5909 Other constipation: Secondary | ICD-10-CM | POA: Diagnosis not present

## 2024-07-21 DIAGNOSIS — R079 Chest pain, unspecified: Secondary | ICD-10-CM | POA: Diagnosis not present

## 2024-07-21 DIAGNOSIS — R1310 Dysphagia, unspecified: Secondary | ICD-10-CM | POA: Diagnosis not present

## 2024-07-21 DIAGNOSIS — K224 Dyskinesia of esophagus: Secondary | ICD-10-CM | POA: Diagnosis not present

## 2024-07-28 ENCOUNTER — Ambulatory Visit: Payer: Self-pay | Admitting: Gastroenterology

## 2024-08-15 ENCOUNTER — Other Ambulatory Visit: Payer: Self-pay | Admitting: Nurse Practitioner

## 2024-08-15 ENCOUNTER — Ambulatory Visit

## 2024-08-26 DIAGNOSIS — H43822 Vitreomacular adhesion, left eye: Secondary | ICD-10-CM | POA: Diagnosis not present

## 2024-08-26 DIAGNOSIS — H5201 Hypermetropia, right eye: Secondary | ICD-10-CM | POA: Diagnosis not present

## 2024-08-26 DIAGNOSIS — Z961 Presence of intraocular lens: Secondary | ICD-10-CM | POA: Diagnosis not present

## 2024-08-26 DIAGNOSIS — Z9842 Cataract extraction status, left eye: Secondary | ICD-10-CM | POA: Diagnosis not present

## 2024-08-26 DIAGNOSIS — H2511 Age-related nuclear cataract, right eye: Secondary | ICD-10-CM | POA: Diagnosis not present

## 2024-09-01 ENCOUNTER — Ambulatory Visit: Payer: Medicare HMO | Admitting: Nurse Practitioner

## 2024-09-02 DIAGNOSIS — S90121A Contusion of right lesser toe(s) without damage to nail, initial encounter: Secondary | ICD-10-CM | POA: Diagnosis not present

## 2024-09-03 ENCOUNTER — Ambulatory Visit: Admitting: Gastroenterology

## 2024-09-03 ENCOUNTER — Encounter: Payer: Self-pay | Admitting: Gastroenterology

## 2024-09-03 VITALS — BP 162/76 | HR 80 | Temp 98.8°F | Ht 65.0 in | Wt 154.0 lb

## 2024-09-03 DIAGNOSIS — R1312 Dysphagia, oropharyngeal phase: Secondary | ICD-10-CM

## 2024-09-03 DIAGNOSIS — K219 Gastro-esophageal reflux disease without esophagitis: Secondary | ICD-10-CM | POA: Diagnosis not present

## 2024-09-03 DIAGNOSIS — K5909 Other constipation: Secondary | ICD-10-CM

## 2024-09-03 NOTE — Patient Instructions (Signed)
 We will submit referral to speech therapy to evaluate your swallowing concerns.   Continue pantoprazole  40mg  daily for acid reflux.  Try taking Linzess  145mcg right AFTER a meal to see if your bowels work better. If not, please call for instructions.   Monitor your right sided chest/upper abdominal concern. If persistent, we can order an ultrasound to evaluate your symptoms.   Return office visit in 4-6 months.

## 2024-09-03 NOTE — Progress Notes (Signed)
 GI Office Note    Referring Provider: Grooms, [place], [place] Primary Care Physician:  Grooms, [place], [place]  Primary Gastroenterologist: Carlin POUR. Cindie, DO   Chief Complaint   Chief Complaint  Patient presents with   Follow-up    History of Present Illness   Emily Jenkins is a 78 y.o. female presenting today for follow-up.  She was last seen in July 2025.  She has a history of GERD and chronic constipation.  She was establishing with us  here locally as she does not like to drive to [place] anymore.  Last saw previous GI, Dr. Martie Sous in March of this year.  At last visit she was complaining of epigastric pain and dysphagia.  She opted for upper GI series rather than EGD.  She was switched from Ex-Lax and stool softeners to Linzess  145 mcg daily.  Upper GI series was completed, 13 mm barium tablet passed normally.  She had mild lower esophageal dysmotility.  No spontaneous GERD.  No hiatal hernia.  Radiologist reported that patient had difficulty initiating swallow.   Discussed the use of AI scribe software for clinical note transcription with the patient, who gave verbal consent to proceed.   She has been experiencing swallowing difficulties for about a year, characterized by the need to take her time, sip water  slowly, chew solid food thoroughly, and take small bites. No reflux or choking, but difficulty initiating swallowing. Does not feel she can drink fast, liquid and food feels like it doesn't want to go down, feels like something in the throat keeping her from swallowing.   She has been experiencing constipation, with a recent episode of not having a bowel movement for a week despite taking Linzess  daily before breakfast. She has been on Linzess  for about six weeks, starting at the end of July, and is currently on a dose of 145 mcg.  She mentions a sensation of a 'knot' under her right breast/right upper abdomen that occurs when bending over to tie her shoes,  which has been present for a long time but is not constant. Today she does not feel it. Could not reproduce on exam.   Wt Readings from Last 3 Encounters:  09/03/24 154 lb (69.9 kg)  07/08/24 155 lb 3.2 oz (70.4 kg)  06/19/24 154 lb (69.9 kg)    Prior Data   Upper GI series August 2025:  IMPRESSION: Double-contrast upper GI significant for: 1.  Mild lower esophageal dysmotility 2. Difficulty initiating swallow. Consider speech pathology consultation if further evaluation is desired.  Egd 03/2017: -small hiatal hernia   Colonoscopy 08/2020: -small sessile polyp prox asc colon removed, benign inflammatory myoglandular polyp -two 7-47mm sessile polyps sigmoid colon removed, hyperplastic polyp   Medications   Current Outpatient Medications  Medication Sig Dispense Refill   aspirin  81 MG tablet Take 81 mg by mouth every evening.      cholecalciferol (VITAMIN D ) 1000 units tablet Take 5,000 Units by mouth daily.      docusate sodium (COLACE) 100 MG capsule Take 100 mg by mouth 2 (two) times daily.     hydrochlorothiazide  (HYDRODIURIL ) 25 MG tablet TAKE 1 TABLET (25 MG TOTAL) BY MOUTH DAILY. 90 tablet 0   linaclotide  (LINZESS ) 145 MCG CAPS capsule Take 1 capsule (145 mcg total) by mouth daily before breakfast. 30 capsule 5   pantoprazole  (PROTONIX ) 40 MG tablet Take 1 tablet Daily for Indigestion & Acid Reflux 90 tablet 3   prednisoLONE acetate (PRED FORTE) 1 %  ophthalmic suspension Place 1 drop into both eyes 2 (two) times daily.     rosuvastatin  (CRESTOR ) 5 MG tablet Take  1 tablet  Daily for Cholesterol 90 tablet 3   telmisartan  (MICARDIS ) 80 MG tablet TAKE 1 TABLET (80 MG TOTAL) BY MOUTH DAILY. FOR BLOOD PRESSURE 90 tablet 0   vitamin C (ASCORBIC ACID) 500 MG tablet Take 500 mg by mouth daily.     No current facility-administered medications for this visit.    Allergies   Allergies as of 09/03/2024 - Review Complete 09/03/2024  Allergen Reaction Noted   Escitalopram  Itching  11/20/2018   Fosamax  [alendronate  sodium] Itching 12/01/2013   Levofloxacin   09/17/2017   Sulfa antibiotics Itching 12/01/2013   Tramadol  Itching 05/16/2016        Review of Systems   General: Negative for anorexia, weight loss, fever, chills, fatigue, weakness. ENT: Negative for hoarseness, nasal congestion.see hpi CV: Negative for chest pain, angina, palpitations, dyspnea on exertion, peripheral edema.  Respiratory: Negative for dyspnea at rest, dyspnea on exertion, cough, sputum, wheezing.  GI: See history of present illness. GU:  Negative for dysuria, hematuria, urinary incontinence, urinary frequency, nocturnal urination.  Endo: Negative for unusual weight change.     Physical Exam   BP (!) 162/76 (BP Location: Right Arm, Patient Position: Sitting, Cuff Size: Normal)   Pulse 80   Temp 98.8 F (37.1 C) (Oral)   Ht 5' 5 (1.651 m)   Wt 154 lb (69.9 kg)   SpO2 98%   BMI 25.63 kg/m    General: Well-nourished, well-developed in no acute distress.  Eyes: No icterus. Mouth: Oropharyngeal mucosa moist and pink   Lungs: Clear to auscultation bilaterally.  Heart: Regular rate and rhythm, no murmurs rubs or gallops.  Abdomen: Bowel sounds are normal, nontender, nondistended, no hepatosplenomegaly or masses,  no abdominal bruits or hernia , no rebound or guarding.  Rectal: not performed Extremities: No lower extremity edema. No clubbing or deformities. Neuro: Alert and oriented x 4   Skin: Warm and dry, no jaundice.   Psych: Alert and cooperative, normal mood and affect.  Labs   Lab Results  Component Value Date   NA 140 06/19/2024   CL 100 06/19/2024   K 3.7 06/19/2024   CO2 23 06/19/2024   BUN 14 06/19/2024   CREATININE 0.86 06/19/2024   EGFR 69 06/19/2024   CALCIUM  10.3 06/19/2024   ALBUMIN 3.5 09/10/2017   GLUCOSE 62 (L) 06/19/2024   Lab Results  Component Value Date   ALT 8 09/04/2023   AST 16 09/04/2023   ALKPHOS 119 09/10/2017   BILITOT 0.6 09/04/2023    Lab Results  Component Value Date   WBC 3.3 (L) 03/18/2024   HGB 13.1 03/18/2024   HCT 40.0 03/18/2024   MCV 83 03/18/2024   PLT 249 03/18/2024   Lab Results  Component Value Date   HGBA1C 5.7 (H) 03/18/2024   Lab Results  Component Value Date   TSH 2.320 05/20/2024    Imaging Studies   No results found.  Assessment/Plan:    Chronic constipation Chronic constipation persists despite Linzess  145 mcg. She noted that Linzess  worked shortly after taking it with food. She has been taking on empty stomach as instructed.  - Advise taking Linzess  with food. If still not effective, we will increast dose to 290mcg daily.   - Ensure Linzess  stored in original bottle with desiccant.  Oropharyngeal dysphagia Difficulty initiating swallowing with no choking or aspiration. Barium esophagram showed no  reflux or strictures, but difficulty transferring from mouth to esophagus. Possible oropharyngeal origin suggested by radiologist. - Refer to speech therapy for evaluation and potential modified swallow study. - she will also discuss symptoms with her endocrinologist to determine if possible thyroid  enlargement contribution.  Right upper abdominal episodic pain/sensation of a knot when bending over Episodic pain under right breast/right upper abdomen with bending over. No consistent pattern or recent imaging available.could not reproduce on exam today.  -remote CT showed multiple hepatic cysts which have been present for at least 20 years.  -if persistent pain, she will let me know and we will proceed with ruq u/s  GERD: -continue pantoprazole  40mg  daily  Sonny RAMAN. Ezzard, MHS, PA-C [facility]

## 2024-09-04 ENCOUNTER — Other Ambulatory Visit: Payer: Self-pay | Admitting: *Deleted

## 2024-09-04 DIAGNOSIS — R1312 Dysphagia, oropharyngeal phase: Secondary | ICD-10-CM

## 2024-09-12 ENCOUNTER — Other Ambulatory Visit: Payer: Self-pay | Admitting: Physician Assistant

## 2024-09-12 DIAGNOSIS — I10 Essential (primary) hypertension: Secondary | ICD-10-CM

## 2024-09-17 ENCOUNTER — Encounter: Payer: Self-pay | Admitting: Physician Assistant

## 2024-09-17 ENCOUNTER — Ambulatory Visit (INDEPENDENT_AMBULATORY_CARE_PROVIDER_SITE_OTHER): Admitting: Physician Assistant

## 2024-09-17 VITALS — BP 146/72 | HR 87 | Resp 16 | Ht 65.0 in | Wt 154.0 lb

## 2024-09-17 DIAGNOSIS — I10 Essential (primary) hypertension: Secondary | ICD-10-CM

## 2024-09-17 DIAGNOSIS — Z1231 Encounter for screening mammogram for malignant neoplasm of breast: Secondary | ICD-10-CM | POA: Diagnosis not present

## 2024-09-17 DIAGNOSIS — Z23 Encounter for immunization: Secondary | ICD-10-CM

## 2024-09-17 DIAGNOSIS — E782 Mixed hyperlipidemia: Secondary | ICD-10-CM | POA: Diagnosis not present

## 2024-09-17 DIAGNOSIS — M810 Age-related osteoporosis without current pathological fracture: Secondary | ICD-10-CM | POA: Diagnosis not present

## 2024-09-17 DIAGNOSIS — R7303 Prediabetes: Secondary | ICD-10-CM

## 2024-09-17 MED ORDER — HYDROCHLOROTHIAZIDE 12.5 MG PO TABS: 12.5000 mg | ORAL_TABLET | Freq: Every day | ORAL | 3 refills | Status: DC

## 2024-09-17 MED ORDER — COVID-19 MRNA VAC-TRIS(PFIZER) 30 MCG/0.3ML IM SUSY: 0.3000 mL | PREFILLED_SYRINGE | Freq: Once | INTRAMUSCULAR | 0 refills | Status: AC

## 2024-09-17 NOTE — Assessment & Plan Note (Signed)
 Stable. Continue with current management without changes. Discussed healthy diet and lifestyle. Discussed importance of cholesterol medication and daily compliance.

## 2024-09-17 NOTE — Assessment & Plan Note (Signed)
 A1c today, stable over the last 2-3 years. Continue with healthy diet and daily activity.

## 2024-09-17 NOTE — Progress Notes (Signed)
 Established Patient Office Visit  Subjective   Patient ID: Emily Jenkins, female    DOB: Apr 26, 1946  Age: 78 y.o. MRN: [id number]  Chief Complaint  Patient presents with   Medical Management of Chronic Issues    6 month follow up     Discussed the use of AI scribe software for clinical note transcription with the patient, who gave verbal consent to proceed.  History of Present Illness Emily Jenkins is a 78 year old female who presents for a general follow-up visit.  She experienced increased leg swelling and significant fatigue after her hydrochlorothiazide  dosage was increased to 25 mg. Reverting to the original dose of 12.5 mg improved her energy levels and reduced leg swelling.  She sustained a toe injury from a fall on September 02, 2024. An x-ray did not show a fracture, but she continues to experience pain. She uses a topical analgesic, which provides some relief.  She experiences constipation with bowel movements occurring only once a week. Taking her medication after meals is more effective. She sometimes feels bloated but is not currently taking additional medication for constipation. She follows regularly with GI.   She has difficulty sleeping, although it has improved compared to before. She experiences occasional headaches, which she attributes to sinus issues.  She manages prediabetes with an A1c of 5.7 in April through diet and exercise.   Review of Systems  Constitutional:  Negative for chills, fever and malaise/fatigue.  Eyes:  Negative for blurred vision and double vision.  Respiratory:  Negative for cough and shortness of breath.   Cardiovascular:  Positive for leg swelling. Negative for chest pain and palpitations.  Gastrointestinal:  Positive for constipation. Negative for abdominal pain.  Musculoskeletal:  Positive for joint pain (foot pain- improving). Negative for myalgias.  Neurological:  Positive for headaches. Negative for dizziness.   Psychiatric/Behavioral:  Negative for depression. The patient is not nervous/anxious.       Objective:     BP (!) 146/72   Pulse 87   Resp 16   Ht 5' 5 (1.651 m)   Wt 154 lb 0.6 oz (69.9 kg)   SpO2 96%   BMI 25.63 kg/m    Physical Exam Constitutional:      General: She is not in acute distress.    Appearance: Normal appearance. She is normal weight. She is not ill-appearing.  HENT:     Head: Normocephalic and atraumatic.     Mouth/Throat:     Mouth: Mucous membranes are moist.     Pharynx: Oropharynx is clear.  Eyes:     Extraocular Movements: Extraocular movements intact.     Conjunctiva/sclera: Conjunctivae normal.  Cardiovascular:     Rate and Rhythm: Normal rate and regular rhythm.     Heart sounds: Normal heart sounds. No murmur heard. Pulmonary:     Effort: Pulmonary effort is normal.     Breath sounds: Normal breath sounds. No rales.  Musculoskeletal:     Right lower leg: Edema (mild) present.     Left lower leg: Edema (mild) present.     Right foot: Normal range of motion and normal capillary refill. Tenderness present. No bony tenderness. Normal pulse.  Feet:     Right foot:     Skin integrity: No erythema.  Skin:    General: Skin is warm and dry.  Neurological:     General: No focal deficit present.     Mental Status: She is alert and oriented to person, place,  and time.  Psychiatric:        Mood and Affect: Mood normal.        Behavior: Behavior normal.     No results found for any visits on 09/17/24.  The 10-year ASCVD risk score (Arnett DK, et al., 2019) is: 23.5%    Assessment & Plan:   Return in about 6 months (around 03/18/2025) for f/u.   Essential hypertension Assessment & Plan: 153/76, 146/72- has not taken blood pressure medication this morning.  Stable, no symptomatology. Discussed daily medication compliance.  Continue current medications. No change in management. Discussed DASH diet and dietary sodium restrictions.  Continue  dietary efforts and physical activity. Alert clinic of new symptoms such as chest pain, shortness of breath, headache, or visual changes.    Orders: -     CMP14+EGFR -     CBC with Differential/Platelet  Prediabetes Assessment & Plan: A1c today, stable over the last 2-3 years. Continue with healthy diet and daily activity.   Orders: -     Hemoglobin A1c  Hyperlipidemia, mixed Assessment & Plan: Stable. Continue with current management without changes. Discussed healthy diet and lifestyle. Discussed importance of cholesterol medication and daily compliance.     Encounter for screening mammogram for malignant neoplasm of breast -     3D Screening Mammogram, Left and Right  Age-related osteoporosis without current pathological fracture -     DG Bone Density  Encounter for immunization -     Flu vaccine HIGH DOSE PF(Fluzone Trivalent)  Other orders -     hydroCHLOROthiazide ; Take 1 tablet (12.5 mg total) by mouth daily.  Dispense: 90 tablet; Refill: 3 -     COVID-19 mRNA Vac-TriS(Pfizer); Inject 0.3 mLs into the muscle once for 1 dose.  Dispense: 0.3 mL; Refill: 0    Marilea Gwynne, PA-C

## 2024-09-17 NOTE — Assessment & Plan Note (Signed)
 153/76, 146/72- has not taken blood pressure medication this morning.  Stable, no symptomatology. Discussed daily medication compliance.  Continue current medications. No change in management. Discussed DASH diet and dietary sodium restrictions.  Continue dietary efforts and physical activity. Alert clinic of new symptoms such as chest pain, shortness of breath, headache, or visual changes.

## 2024-09-18 ENCOUNTER — Ambulatory Visit: Payer: Self-pay | Admitting: Physician Assistant

## 2024-09-18 LAB — CBC WITH DIFFERENTIAL/PLATELET
Basophils Absolute: 0 x10E3/uL (ref 0.0–0.2)
Basos: 1 %
EOS (ABSOLUTE): 0.1 x10E3/uL (ref 0.0–0.4)
Eos: 3 %
Hematocrit: 38.6 % (ref 34.0–46.6)
Hemoglobin: 12.4 g/dL (ref 11.1–15.9)
Immature Grans (Abs): 0 x10E3/uL (ref 0.0–0.1)
Immature Granulocytes: 0 %
Lymphocytes Absolute: 0.9 x10E3/uL (ref 0.7–3.1)
Lymphs: 31 %
MCH: 27.7 pg (ref 26.6–33.0)
MCHC: 32.1 g/dL (ref 31.5–35.7)
MCV: 86 fL (ref 79–97)
Monocytes Absolute: 0.3 x10E3/uL (ref 0.1–0.9)
Monocytes: 9 %
Neutrophils Absolute: 1.7 x10E3/uL (ref 1.4–7.0)
Neutrophils: 56 %
Platelets: 229 x10E3/uL (ref 150–450)
RBC: 4.47 x10E6/uL (ref 3.77–5.28)
RDW: 13.7 % (ref 11.7–15.4)
WBC: 3 x10E3/uL — ABNORMAL LOW (ref 3.4–10.8)

## 2024-09-18 LAB — CMP14+EGFR
ALT: 8 IU/L (ref 0–32)
AST: 18 IU/L (ref 0–40)
Albumin: 4.2 g/dL (ref 3.8–4.8)
Alkaline Phosphatase: 85 IU/L (ref 49–135)
BUN/Creatinine Ratio: 14 (ref 12–28)
BUN: 11 mg/dL (ref 8–27)
Bilirubin Total: 0.5 mg/dL (ref 0.0–1.2)
CO2: 24 mmol/L (ref 20–29)
Calcium: 9.9 mg/dL (ref 8.7–10.3)
Chloride: 105 mmol/L (ref 96–106)
Creatinine, Ser: 0.8 mg/dL (ref 0.57–1.00)
Globulin, Total: 2.8 g/dL (ref 1.5–4.5)
Glucose: 84 mg/dL (ref 70–99)
Potassium: 3.8 mmol/L (ref 3.5–5.2)
Sodium: 144 mmol/L (ref 134–144)
Total Protein: 7 g/dL (ref 6.0–8.5)
eGFR: 75 mL/min/1.73 (ref >59)

## 2024-09-18 LAB — HEMOGLOBIN A1C
Est. average glucose Bld gHb Est-mCnc: 114 mg/dL
Hgb A1c MFr Bld: 5.6 % (ref 4.8–5.6)

## 2024-10-06 NOTE — Telephone Encounter (Signed)
-----   Message from Tumalo Grooms sent at 09/18/2024  1:26 PM EDT ----- Lab results reviewed and are all within normal limits.  A1c is no longer in prediabetic range, continue healthy diet and activity.  ----- Message ----- From: Interface, [facility] Lab Results In Sent: 09/18/2024   5:36 AM EDT To: Charmaine Grooms, PA-C

## 2024-10-06 NOTE — Telephone Encounter (Signed)
Patient informed of results and recommendations

## 2024-10-21 ENCOUNTER — Encounter: Payer: Self-pay | Admitting: Internal Medicine

## 2024-10-21 ENCOUNTER — Ambulatory Visit (INDEPENDENT_AMBULATORY_CARE_PROVIDER_SITE_OTHER): Payer: Medicare HMO | Admitting: Internal Medicine

## 2024-10-21 VITALS — BP 140/70 | HR 68 | Ht 65.0 in | Wt 153.0 lb

## 2024-10-21 DIAGNOSIS — E042 Nontoxic multinodular goiter: Secondary | ICD-10-CM | POA: Diagnosis not present

## 2024-10-21 NOTE — Progress Notes (Signed)
 Name: Emily Jenkins  MRN/ DOB: [id number], 1946-11-15    Age/ Sex: 78 y.o., female     PCP: Grooms, Charmaine RIGGERS   Reason for Endocrinology Evaluation: MNG     Initial Endocrinology Clinic Visit: 04/29/21    PATIENT IDENTIFIER: Emily Jenkins is a 78 y.o., female with a past medical history of .HTN, SVT, Osteoporosis and mixed hyperlipidemia. She has followed with [facility] since 04/29/2021 for consultative assistance with management of her MNG.   HISTORICAL SUMMARY:  She was noted to have a right inferior thyroid  nodule on CT scan of the chest in 01/2021. This promoted a thyroid  ultrasound demonstrating a questionable right parathyroid adenoma vs thyroid  nodule. She also has sub-centimeter thyroid  nodules    Of note the pt has normal calcium  and parathyroid hormone level.    No FH of thyroid  disease   She is/P FNA of the right inferior 2.2 cm nodule 11/2022 with atypia of undetermined significance, Afirma was benign  SUBJECTIVE:     Today (10/21/2024):  Emily Jenkins is here for a follow up on Multinodular goiter.  No local neck swelling  No dysphagia but has to consume small bites Chronic  palpitations are improving  Has noted fine tremors  Has occasional constipation but no diarrhea    HISTORY:  Past Medical History:  Past Medical History:  Diagnosis Date   Depression 09/22/2017   09/21/2017 PHQ-9: 9   DJD (degenerative joint disease)    GERD (gastroesophageal reflux disease)    Hyperlipidemia    Hypertension    Pre-diabetes    RLS (restless legs syndrome)    Past Surgical History:  Past Surgical History:  Procedure Laterality Date   ANKLE SURGERY     CATARACT EXTRACTION W/PHACO Left 05/02/2024   Procedure: PHACOEMULSIFICATION, CATARACT, WITH IOL INSERTION;  Surgeon: Juli Blunt, MD;  Location: [facility];  Service: Ophthalmology;  Laterality: Left;  CDE: 3.97   CHOLECYSTECTOMY     LEFT HEART CATHETERIZATION WITH CORONARY  ANGIOGRAM N/A 07/29/2014   Procedure: LEFT HEART CATHETERIZATION WITH CORONARY ANGIOGRAM;  Surgeon: Lonni JONETTA Cash, MD;  Location[facility] C[facility] LAB;  Service: Cardiovascular;  Laterality: N/A;   TUBAL LIGATION     Social History:  reports that she has never smoked. She has never used smokeless tobacco. She reports that she does not drink alcohol and does not use drugs. Family History:  Family History  Problem Relation Age of Onset   Heart disease Mother 79       stent   Hypertension Mother    Cancer Father    Diabetes Father    Hyperlipidemia Sister    Hypertension Sister      HOME MEDICATIONS: Allergies as of 10/21/2024       Reactions   Escitalopram  Itching   Fosamax  [alendronate  Sodium] Itching   Levofloxacin     Headache, chest pain, muscle aches   Sulfa Antibiotics Itching   Tramadol  Itching        Medication List        Accurate as of October 21, 2024 11:06 AM. If you have any questions, ask your nurse or doctor.          ascorbic acid 500 MG tablet Commonly known as: VITAMIN C Take 500 mg by mouth daily.   aspirin  81 MG tablet Take 81 mg by mouth every evening.   cholecalciferol 1000 units tablet Commonly known as: VITAMIN D  Take 5,000 Units by mouth daily.   docusate sodium 100 MG  capsule Commonly known as: COLACE Take 100 mg by mouth 2 (two) times daily.   hydrochlorothiazide  12.5 MG tablet Commonly known as: HYDRODIURIL  Take 1 tablet (12.5 mg total) by mouth daily.   linaclotide  145 MCG Caps capsule Commonly known as: Linzess  Take 1 capsule (145 mcg total) by mouth daily before breakfast.   pantoprazole  40 MG tablet Commonly known as: Protonix  Take 1 tablet Daily for Indigestion & Acid Reflux   prednisoLONE acetate 1 % ophthalmic suspension Commonly known as: PRED FORTE Place 1 drop into both eyes 2 (two) times daily.   rosuvastatin  5 MG tablet Commonly known as: CRESTOR  Take  1 tablet  Daily for Cholesterol   telmisartan  80 MG  tablet Commonly known as: MICARDIS  TAKE 1 TABLET (80 MG TOTAL) BY MOUTH DAILY. FOR BLOOD PRESSURE          OBJECTIVE:   PHYSICAL EXAM: VS: Ht 5' 5 (1.651 m)   Wt 153 lb (69.4 kg)   BMI 25.46 kg/m    EXAM: General: Pt appears well and is in NAD  Neck: General: Supple without adenopathy. Thyroid : Thyroid  size normal.  No goiter or nodules appreciated.  Lungs: Clear with good BS bilat   Heart: Auscultation: RRR.  Extremities: No pretibial edema normal   Mental Status: Judgment, insight: Intact Orientation: Oriented to time, place, and person Mood and affect: No depression, anxiety, or agitation     DATA REVIEWED:   Latest Reference Range & Units 05/20/24 10:55  TSH 0.450 - 4.500 uIU/mL 2.320  T4,Free(Direct) 0.82 - 1.77 ng/dL 8.69     Thyroid  Ultrasound 10/24/2023  FINDINGS: Parenchymal Echotexture: Mildly heterogeneous   Isthmus: 0.4 cm ,previously 0.4 cm   Right lobe: 3.7 x 2.2 x 1.7 cm ,previously 3.6 x 2.1 x 1.5 cm   Left lobe: 4.0 x 1.5 x 1.3 cm ,previously 4.3 x 1.7 x 1.1 cm   ________________________________________________________   Estimated total number of nodules >/= 1 cm: 2   Number of spongiform nodules >/=  2 cm not described below (TR1): 0   Number of mixed cystic and solid nodules >/= 1.5 cm not described below (TR2): 0   _________________________________________________________   Similar benign-appearing solid nodule in the superior isthmus (labeled 1, 0.7 cm, previously 0.5 cm).   Nodule # 2:   Prior biopsy: No   Location: Isthmus; Inferior   Maximum size: 1.2 cm; Other 2 dimensions: 1.0 x 0.8 cm, previously, 0.9 x 0.8 x 0.4 cm   Composition: solid/almost completely solid (2)   Echogenicity: hypoechoic (2)   Shape: not taller-than-wide (0)   Margins: smooth (0)   Echogenic foci: none (0)   ACR TI-RADS total points: 4.   ACR TI-RADS risk category:  TR4 (4-6 points).   Significant change in size (>/= 20% in two  dimensions and minimal increase of 2 mm): Yes   Change in features: No   Change in ACR TI-RADS risk category: No   ACR TI-RADS recommendations:   *Given size (>/= 1 - 1.4 cm) and appearance, a follow-up ultrasound in 1 year should be considered based on TI-RADS criteria.   _________________________________________________________   Similar benign appearing solid nodule in the right superior thyroid  (labeled 3, 0.6 cm, previously 0.6 cm).   Similar appearance of previously biopsied right inferior solid thyroid  nodule (labeled 4, 2.4 cm, previously 2.2 cm).   No cervical lymphadenopathy.   IMPRESSION: 1. Similar appearing multinodular thyroid . 2. Slight interval enlargement of previously visualized inferior isthmus nodule (labeled 2, 1.2 cm,  previously 0.9 cm), which now meets criteria (TI-RADS category 4) for 1 year ultrasound surveillance. 3. Similar appearance of previously biopsied right inferior solid thyroid  nodule (labeled 4, 2.4 cm, previously 2.2 cm). Recommend correlation prior biopsy results. 4. The remaining visualized bilateral thyroid  nodules appear benign and do not warrant additional follow-up.    FNA right inferior nodule 11/22/2022   Clinical History: Nodule #4: Right; Inferior, Maximum size: 2.2 cm;  other 2 dimensions: 1.5 x 1.4, previously, 1.7 x 1.5 x 0.9 cm,  solid/almost completely solid, hypoechoic, Margins: extra-thyroidal  extension, TI-RADS total points: 7.  Specimen Submitted:  A. THYROID , RT INFERIOR, FINE NEEDLE ASPIRATION:    FINAL MICROSCOPIC DIAGNOSIS:  - Follicular lesion of undetermined significance (Bethesda category III)    Afirma - benign    ASSESSMENT / PLAN / RECOMMENDATIONS:   MNG:    -Patient is clinically and biochemically euthyroid -No local neck symptoms -She is s/p benign FNA of the right inferior nodule in 2023 - Will proceed with repeat annual thyroid  ultrasound   F/U in 1 yr   Signed electronically  by: Stefano Redgie Butts, MD  [facility]  [facility] [address] [place] [place], [place] [address] Phone: [phone] FAX: [phone]      CC: Grooms, Charmaine RIGGERS [address] [place] [address] Phone: [phone]  Fax: [phone]   Return to Endocrinology clinic as below: Future Appointments  Date Time Provider Department Center  10/21/2024 11:10 AM Pete Schnitzer, Donell Redgie, MD [facility] None  03/18/2025  1:20 PM Grooms, [place], [place] [facility]

## 2024-10-28 ENCOUNTER — Ambulatory Visit
Admission: RE | Admit: 2024-10-28 | Discharge: 2024-10-28 | Disposition: A | Source: Ambulatory Visit | Attending: Internal Medicine | Admitting: Internal Medicine

## 2024-10-28 DIAGNOSIS — E042 Nontoxic multinodular goiter: Secondary | ICD-10-CM

## 2024-11-05 ENCOUNTER — Ambulatory Visit: Payer: Self-pay | Admitting: Internal Medicine

## 2024-12-02 DIAGNOSIS — Z1231 Encounter for screening mammogram for malignant neoplasm of breast: Secondary | ICD-10-CM | POA: Diagnosis not present

## 2024-12-02 LAB — HM MAMMOGRAPHY

## 2024-12-10 ENCOUNTER — Encounter: Payer: Self-pay | Admitting: Gastroenterology

## 2024-12-18 ENCOUNTER — Ambulatory Visit

## 2024-12-18 VITALS — BP 168/98 | Ht 65.5 in | Wt 152.0 lb

## 2024-12-18 DIAGNOSIS — Z780 Asymptomatic menopausal state: Secondary | ICD-10-CM | POA: Diagnosis not present

## 2024-12-18 DIAGNOSIS — Z0000 Encounter for general adult medical examination without abnormal findings: Secondary | ICD-10-CM | POA: Diagnosis not present

## 2024-12-18 NOTE — Patient Instructions (Signed)
 [name],  Thank you for taking the time for your Medicare Wellness Visit. I appreciate your continued commitment to your health goals. Please review the care plan we discussed, and feel free to reach out if I can assist you further.  Please note that Annual Wellness Visits do not include a physical exam. Some assessments may be limited, especially if the visit was conducted virtually. If needed, we may recommend an in-person follow-up with your provider.  Ongoing Care Seeing your primary care provider every 3 to 6 months helps us  monitor your health and provide consistent, personalized care.   1 year follow up for Medicare well visit: December 24, 2025 at 9:20 am with medicare wellness nurse in office  Referrals If a referral was made during today's visit and you haven't received any updates within two weeks, please contact the referred provider directly to check on the status.  Mammogram at [facility] Call [phone] to schedule your screening No perfumes, lotions, or deodorants the day of your screening. You can schedule your mammogram through mychart!  Recommended Screenings:  Health Maintenance  Topic Date Due   Zoster (Shingles) Vaccine (1 of 2) 08/20/1964   Osteoporosis screening with Bone Density Scan  03/05/2023   Medicare Annual Wellness Visit  02/28/2024   COVID-19 Vaccine (4 - 2025-26 season) 08/11/2024   Breast Cancer Screening  11/29/2024   DTaP/Tdap/Td vaccine (4 - Td or Tdap) 06/17/2034   Pneumococcal Vaccine for age over 30  Completed   Flu Shot  Completed   Hepatitis C Screening  Completed   Meningitis B Vaccine  Aged Out   Colon Cancer Screening  Discontinued       12/18/2024   10:13 AM  Advanced Directives  Does Patient Have a Medical Advance Directive? No  Would patient like information on creating a medical advance directive? No - Patient declined    Vision: Annual vision screenings are recommended for early detection of glaucoma, cataracts, and  diabetic retinopathy. These exams can also reveal signs of chronic conditions such as diabetes and high blood pressure.  Dental: Annual dental screenings help detect early signs of oral cancer, gum disease, and other conditions linked to overall health, including heart disease and diabetes.  Please see the attached documents for additional preventive care recommendations.

## 2024-12-18 NOTE — Progress Notes (Signed)
 "  Chief Complaint  Patient presents with   Medicare Wellness     Subjective:   Emily Jenkins is a 79 y.o. female who presents for a Medicare Annual Wellness Visit.  Visit info / Clinical Intake: Medicare Wellness Visit Type:: Subsequent Annual Wellness Visit Persons participating in visit and providing information:: patient Medicare Wellness Visit Mode:: In-person (required for WTM) Interpreter Needed?: No Pre-visit prep was completed: yes AWV questionnaire completed by patient prior to visit?: no Living arrangements:: with family/others Patient's Overall Health Status Rating: very good Typical amount of pain: some Does pain affect daily life?: (!) yes Are you currently prescribed opioids?: no  Dietary Habits and Nutritional Risks How many meals a day?: 3 Eats fruit and vegetables daily?: yes Most meals are obtained by: preparing own meals In the last 2 weeks, have you had any of the following?: none Diabetic:: no  Functional Status Activities of Daily Living (to include ambulation/medication): Independent Ambulation: Independent with device- listed below Home Assistive Devices/Equipment: Cane Medication Administration: Independent Home Management (perform basic housework or laundry): Independent Manage your own finances?: yes Primary transportation is: driving Concerns about vision?: no *vision screening is required for WTM* Concerns about hearing?: no  Fall Screening Falls in the past year?: 1 Number of falls in past year: 1 Was there an injury with Fall?: 1 Fall Risk Category Calculator: 3 Patient Fall Risk Level: High Fall Risk  Fall Risk Patient at Risk for Falls Due to: Impaired balance/gait; History of fall(s); Impaired mobility Fall risk Follow up: Falls evaluation completed; Education provided; Falls prevention discussed  Home and Transportation Safety: All rugs have non-skid backing?: N/A, no rugs All stairs or steps have railings?: yes Grab bars  in the bathtub or shower?: (!) no Have non-skid surface in bathtub or shower?: yes Good home lighting?: yes Regular seat belt use?: yes Hospital stays in the last year:: no  Cognitive Assessment Difficulty concentrating, remembering, or making decisions? : no Will 6CIT or Mini Cog be Completed: yes What year is it?: 0 points What month is it?: 0 points Give patient an address phrase to remember (5 components): [address] [place] About what time is it?: 0 points Count backwards from 20 to 1: 0 points Say the months of the year in reverse: 0 points Repeat the address phrase from earlier: 0 points 6 CIT Score: 0 points  Advance Directives (For Healthcare) Does Patient Have a Medical Advance Directive?: No Would patient like information on creating a medical advance directive?: No - Patient declined  Reviewed/Updated  Reviewed/Updated: Reviewed All (Medical, Surgical, Family, Medications, Allergies, Care Teams, Patient Goals)    Allergies (verified) Escitalopram , Fosamax  [alendronate  sodium], Levofloxacin , Sulfa antibiotics, and Tramadol    Current Medications (verified) Outpatient Encounter Medications as of 12/18/2024  Medication Sig   aspirin  81 MG tablet Take 81 mg by mouth every evening.    cholecalciferol (VITAMIN D ) 1000 units tablet Take 5,000 Units by mouth daily.    docusate sodium (COLACE) 100 MG capsule Take 100 mg by mouth 2 (two) times daily.   hydrochlorothiazide  (HYDRODIURIL ) 12.5 MG tablet Take 1 tablet (12.5 mg total) by mouth daily.   linaclotide  (LINZESS ) 145 MCG CAPS capsule Take 1 capsule (145 mcg total) by mouth daily before breakfast.   pantoprazole  (PROTONIX ) 40 MG tablet Take 1 tablet Daily for Indigestion & Acid Reflux   prednisoLONE acetate (PRED FORTE) 1 % ophthalmic suspension Place 1 drop into both eyes 2 (two) times daily.   rosuvastatin  (CRESTOR ) 5  MG tablet Take  1 tablet  Daily for Cholesterol   telmisartan  (MICARDIS ) 80 MG tablet TAKE 1  TABLET (80 MG TOTAL) BY MOUTH DAILY. FOR BLOOD PRESSURE   vitamin C (ASCORBIC ACID) 500 MG tablet Take 500 mg by mouth daily.   No facility-administered encounter medications on file as of 12/18/2024.    History: Past Medical History:  Diagnosis Date   Depression 09/22/2017   09/21/2017 PHQ-9: 9   DJD (degenerative joint disease)    GERD (gastroesophageal reflux disease)    Hyperlipidemia    Hypertension    Pre-diabetes    RLS (restless legs syndrome)    Past Surgical History:  Procedure Laterality Date   ANKLE SURGERY     CATARACT EXTRACTION W/PHACO Left 05/02/2024   Procedure: PHACOEMULSIFICATION, CATARACT, WITH IOL INSERTION;  Surgeon: Juli Blunt, MD;  Location: [facility];  Service: Ophthalmology;  Laterality: Left;  CDE: 3.97   CHOLECYSTECTOMY     LEFT HEART CATHETERIZATION WITH CORONARY ANGIOGRAM N/A 07/29/2014   Procedure: LEFT HEART CATHETERIZATION WITH CORONARY ANGIOGRAM;  Surgeon: Lonni JONETTA Cash, MD;  Location: [facility] CATH LAB;  Service: Cardiovascular;  Laterality: N/A;   TUBAL LIGATION     Family History  Problem Relation Age of Onset   Heart disease Mother 7       stent   Hypertension Mother    Cancer Father    Diabetes Father    Hyperlipidemia Sister    Hypertension Sister    Social History   Occupational History   Not on file  Tobacco Use   Smoking status: Never   Smokeless tobacco: Never   Tobacco comments:    SPOUSE SMOKES  Vaping Use   Vaping status: Never Used  Substance and Sexual Activity   Alcohol use: No    Alcohol/week: 0.0 standard drinks of alcohol   Drug use: No   Sexual activity: Yes   Tobacco Counseling Counseling given: Yes Tobacco comments: SPOUSE SMOKES  SDOH Screenings   Food Insecurity: No Food Insecurity (12/18/2024)  Housing: Low Risk (12/18/2024)  Transportation Needs: No Transportation Needs (12/18/2024)  Utilities: Not At Risk (12/18/2024)  Depression (PHQ2-9): Medium Risk (12/18/2024)  Physical Activity:  Insufficiently Active (12/18/2024)  Social Connections: Moderately Isolated (12/18/2024)  Stress: No Stress Concern Present (12/18/2024)  Tobacco Use: Low Risk (12/18/2024)  Health Literacy: Adequate Health Literacy (12/18/2024)   See flowsheets for full screening details  Depression Screen PHQ 2 & 9 Depression Scale- Over the past 2 weeks, how often have you been bothered by any of the following problems? Little interest or pleasure in doing things: 0 Feeling down, depressed, or hopeless (PHQ Adolescent also includes...irritable): 0 PHQ-2 Total Score: 0 Trouble falling or staying asleep, or sleeping too much: 2 Feeling tired or having little energy: 3 Poor appetite or overeating (PHQ Adolescent also includes...weight loss): 0 Feeling bad about yourself - or that you are a failure or have let yourself or your family down: 0 Trouble concentrating on things, such as reading the newspaper or watching television (PHQ Adolescent also includes...like school work): 0 Moving or speaking so slowly that other people could have noticed. Or the opposite - being so fidgety or restless that you have been moving around a lot more than usual: 0 Thoughts that you would be better off dead, or of hurting yourself in some way: 0 PHQ-9 Total Score: 5 If you checked off any problems, how difficult have these problems made it for you to do your work, take care of things  at home, or get along with other people?: Not difficult at all  Depression Treatment Depression Interventions/Treatment : Patient refuses Treatment     Goals Addressed             This Visit's Progress    work on decreasing pain               Objective:    There were no vitals filed for this visit. There is no height or weight on file to calculate BMI.  Hearing/Vision screen Hearing Screening - Comments:: Patient denies any hearing difficulties.   Vision Screening - Comments:: Wears rx glasses - up to date with routine eye exams with   Thresa Louder and Marolyn Cleverly (cataract surgery) Immunizations and Health Maintenance Health Maintenance  Topic Date Due   Zoster Vaccines- Shingrix (1 of 2) 08/20/1964   Bone Density Scan  03/05/2023   Medicare Annual Wellness (AWV)  02/28/2024   COVID-19 Vaccine (4 - 2025-26 season) 08/11/2024   Mammogram  11/29/2024   DTaP/Tdap/Td (4 - Td or Tdap) 06/17/2034   Pneumococcal Vaccine: 50+ Years  Completed   Influenza Vaccine  Completed   Hepatitis C Screening  Completed   Meningococcal B Vaccine  Aged Out   Colonoscopy  Discontinued        Assessment/Plan:  This is a routine wellness examination for [place].  Patient Care Team: Grooms, [place]armaine, [place] as PCP - General (Physician Assistant) Elsie Marsa Okey Barbarann Oneil JAYSON, MD (Inactive) as Consulting Physician (Orthopedic Surgery) Kristie Lamprey, MD as Consulting Physician (Gastroenterology) Verlin Lonni BIRCH, MD as Consulting Physician (Cardiology) Louder Thresa, OD (Optometry) Mammography, [facility] (Diagnostic Radiology)  I have personally reviewed and noted the following in the patients chart:   Medical and social history Use of alcohol, tobacco or illicit drugs  Current medications and supplements including opioid prescriptions. Functional ability and status Nutritional status Physical activity Advanced directives List of other physicians Hospitalizations, surgeries, and ER visits in previous 12 months Vitals Screenings to include cognitive, depression, and falls Referrals and appointments  Orders Placed This Encounter  Procedures   DG Bone Density    Standing Status:   Future    Expiration Date:   12/18/2025    Reason for Exam (SYMPTOM  OR DIAGNOSIS REQUIRED):   post menopausal estrogen deficient    Preferred imaging location?:   [facility]   In addition, I have reviewed and discussed with patient certain preventive protocols, quality metrics, and best practice recommendations. A written  personalized care plan for preventive services as well as general preventive health recommendations were provided to patient.   Yzabella Crunk, CMA   12/18/2024   Return December 24, 2025 at 9:20 am, for In office Medicare Well Visit w  Wellness Nurse.  After Visit Summary: (In Person-Printed) AVS printed and given to the patient   "

## 2024-12-22 ENCOUNTER — Encounter: Payer: Self-pay | Admitting: Physician Assistant

## 2024-12-22 ENCOUNTER — Ambulatory Visit: Admitting: Physician Assistant

## 2024-12-22 VITALS — BP 144/82 | HR 73 | Temp 97.9°F | Ht 65.5 in | Wt 153.6 lb

## 2024-12-22 DIAGNOSIS — I10 Essential (primary) hypertension: Secondary | ICD-10-CM | POA: Diagnosis not present

## 2024-12-22 MED ORDER — HYDROCHLOROTHIAZIDE 25 MG PO TABS: 25.0000 mg | ORAL_TABLET | Freq: Every day | ORAL | 3 refills | Status: AC

## 2024-12-22 NOTE — Assessment & Plan Note (Signed)
 Blood pressure remains elevated despite current medication regimen. Symptoms include intermittent orthopnea, lightheadedness, and dizziness. She appears well on exam today, lungs clear to auscultation bilaterally, normal heart sounds.  - Increased hydrochlorothiazide  to 25 mg daily. - Monitor blood pressure at home and report significant changes. - Scheduled follow-up in one month or sooner if experiencing dizziness, shortness of breath, or falls.

## 2024-12-22 NOTE — Progress Notes (Signed)
 "  Acute Office Visit  Subjective:     Patient ID: Emily Jenkins, female    DOB: 12-Aug-1946, 79 y.o.   MRN: [id number]   Discussed the use of AI scribe software for clinical note transcription with the patient, who gave verbal consent to proceed.  History of Present Illness Emily Jenkins is a 79 year old female with hypertension who presents with elevated blood pressure. She presents for follow-up after an elevated blood pressure reading (168/98) during her Medicare visit last Thursday. Home blood pressures since then range from 118/78 to 153/78.  She reports intermittent shortness of breath and headache, but denies symptoms at present. She denies room-spinning dizziness but feels unsteady at times. She reports a fall in November that caused a toe dislocation, now healed without pain, though the toe remains slightly discolored.  She is taking hydrochlorothiazide  12.5 mg and telmisartan  for blood pressure and has 25 mg hydrochlorothiazide  tablets at home from a prior higher dose.    Review of Systems  Constitutional:  Negative for activity change, appetite change, fatigue and fever.  Eyes:  Negative for visual disturbance.  Respiratory:  Positive for shortness of breath. Negative for cough.   Cardiovascular:  Negative for chest pain.  Neurological:  Positive for light-headedness. Negative for dizziness and headaches.         Objective:     BP (!) 144/82 (BP Location: Right Arm, Cuff Size: Normal)   Pulse 73   Temp 97.9 F (36.6 C)   Ht 5' 5.5 (1.664 m)   Wt 153 lb 9.6 oz (69.7 kg)   SpO2 100%   BMI 25.17 kg/m   Physical Exam Constitutional:      General: She is not in acute distress.    Appearance: Normal appearance. She is normal weight. She is not ill-appearing.  HENT:     Head: Normocephalic and atraumatic.     Mouth/Throat:     Mouth: Mucous membranes are moist.     Pharynx: Oropharynx is clear.  Eyes:     Extraocular Movements: Extraocular  movements intact.     Conjunctiva/sclera: Conjunctivae normal.  Cardiovascular:     Rate and Rhythm: Normal rate and regular rhythm.     Heart sounds: Normal heart sounds. No murmur heard. Pulmonary:     Effort: Pulmonary effort is normal.     Breath sounds: Normal breath sounds. No wheezing, rhonchi or rales.  Musculoskeletal:     Right lower leg: No edema.     Left lower leg: No edema.  Skin:    General: Skin is warm and dry.  Neurological:     General: No focal deficit present.     Mental Status: She is alert and oriented to person, place, and time.  Psychiatric:        Mood and Affect: Mood normal.        Behavior: Behavior normal.     No results found for any visits on 12/22/24.      Assessment & Plan:  Essential hypertension Assessment & Plan: Blood pressure remains elevated despite current medication regimen. Symptoms include intermittent orthopnea, lightheadedness, and dizziness. She appears well on exam today, lungs clear to auscultation bilaterally, normal heart sounds.  - Increased hydrochlorothiazide  to 25 mg daily. - Monitor blood pressure at home and report significant changes. - Scheduled follow-up in one month or sooner if experiencing dizziness, shortness of breath, or falls.  Orders: -     hydroCHLOROthiazide ; Take 1 tablet (25 mg total) by  mouth daily.  Dispense: 90 tablet; Refill: 3    Return in about 4 weeks (around 01/19/2025) for BP.  Rhiannon Sassaman, PA-C  "

## 2024-12-23 ENCOUNTER — Encounter: Payer: Self-pay | Admitting: Physician Assistant

## 2025-01-19 ENCOUNTER — Ambulatory Visit: Admitting: Physician Assistant

## 2025-03-03 ENCOUNTER — Other Ambulatory Visit (HOSPITAL_COMMUNITY)

## 2025-03-18 ENCOUNTER — Ambulatory Visit: Admitting: Physician Assistant

## 2025-12-24 ENCOUNTER — Ambulatory Visit
